# Patient Record
Sex: Female | Born: 1950 | Race: Black or African American | Hispanic: No | Marital: Single | Smoking: Never smoker
Health system: Southern US, Community
[De-identification: ages and names within clinical notes are randomized; demographics above are authoritative.]

## PROBLEM LIST (undated history)

## (undated) DIAGNOSIS — D0511 Intraductal carcinoma in situ of right breast: Secondary | ICD-10-CM

## (undated) DIAGNOSIS — Z9013 Acquired absence of bilateral breasts and nipples: Secondary | ICD-10-CM

## (undated) DIAGNOSIS — Z170 Estrogen receptor positive status [ER+]: Secondary | ICD-10-CM

## (undated) DIAGNOSIS — R404 Transient alteration of awareness: Secondary | ICD-10-CM

## (undated) DIAGNOSIS — T50905A Adverse effect of unspecified drugs, medicaments and biological substances, initial encounter: Secondary | ICD-10-CM

## (undated) DIAGNOSIS — Z9011 Acquired absence of right breast and nipple: Secondary | ICD-10-CM

## (undated) DIAGNOSIS — C50112 Malignant neoplasm of central portion of left female breast: Principal | ICD-10-CM

## (undated) DIAGNOSIS — I10 Essential (primary) hypertension: Secondary | ICD-10-CM

## (undated) DIAGNOSIS — E7800 Pure hypercholesterolemia, unspecified: Secondary | ICD-10-CM

## (undated) DIAGNOSIS — S0990XA Unspecified injury of head, initial encounter: Secondary | ICD-10-CM

## (undated) HISTORY — PX: NASAL SINUS SURGERY: SHX719

## (undated) HISTORY — PX: LUNG SURGERY: SHX703

## (undated) HISTORY — PX: BRAIN SURGERY: SHX531

---

## 2013-10-13 ENCOUNTER — Inpatient Hospital Stay
Admit: 2013-10-13 | Discharge: 2013-10-14 | Disposition: A | Discharge status: Home / Self Care | Attending: Surgery | Admitting: Surgery

## 2013-10-13 NOTE — Op Note (Signed)
Dawn Padilla, Dawn Padilla      SURGERY DATE:         10/13/2013  MEDICAL RECORD NUMBER:     [id number]            ADMISSION DATE:       10/13/2013  ACCOUNT #:           [id number]           ADMITTING:            Nicholes Mango, MD  DATE OF BIRTH:       [birth year]             SURGEON:              Nicholes Mango, MD  AGE:                 [place]:                                  Electronically [place], MD 10/16/2013 07:12 P        Procedure:  LEFT MODIFIED RADICAL MASTECTOMY.    Preoperative Diagnosis:  Left breast cancer, lobular type.    Postoperative Diagnosis:  Left breast cancer, lobular type.    Anesthesia:  General.    Assistant:  Lelon Huh, M.D.    Fluids:  1300 crystalloid.    Estimated Blood Loss:  Minimal.    Medications Given:  Ancef 1 g.    Drains Placed:  Two Jackson-Pratt drains.    Clinical History:  This is a 63 year old African-American female, who  was found to have some indeterminate microcalcifications in the left  breast.  She has multiple medical problems.  She was unable to have a  stereotactic biopsy due to her breast size and therefore had an open  biopsy for these calcifications, which were benign; however, she did  have invasive lobular carcinoma identified with positive margins.  She  had a breast MRI that showed residual enhancement.  No evidence of any  adenopathy and clinically she was node negative.  She was ER positive.  Various surgical options were given including reexcision lumpectomy  with sentinel biopsy, followed by radiation or mastectomy with or  without reconstruction.  As stated, she does have multiple medical  problems.  She elected to have a mastectomy without reconstruction due  to her medical issues.  Risks and benefits of the procedure were  explained including, but not limited to bleeding, infection, seroma  formation, lymphedema.  She understood and  agreed to proceed with  surgery.    Description of Procedure:  The patient was brought to the operating  room and placed in a supine position.  General anesthesia was induced.  I injected approximately 500 microcuries of technetium-90m tilmanocept  in 4 divided doses intradermally around the areola using sterile  technique and also injected 4 mL of methylene blue in intraparenchymal  fashion.  The left breast was massaged for about 5 minutes.  The left  breast and axilla were then prepped and draped in  the usual sterile  manner.  The plasma blade was used to create an elliptical incision  around the nipple-areolar complex.  Skin flaps raised superiorly to  the clavicle, medially to the sternum, inferiorly to the edge of the  breast tissue and laterally to latissimus dorsi.  The breast and  pectoralis fascia removed in a medial to lateral fashion.  The  clavipectoral fascia was opened.  She had a deep blue lymphatic going  to a hot blue lymph node.  This was somewhat firm.  There were 2  additional sentinel nodes that were removed.  Sentinel node #1 was  positive and therefore completion axillary dissection was carried out.  The axillary vein was identified.  Tributaries to the axillary vein  were clipped and divided.  The long thoracic and thoracodorsal nerves  were both identified and preserved.  The level 1 and 2 axillary tissue  was then removed and sent separately.  The wound was irrigated.  Hemostasis controlled.  Two Jackson-Pratt drains were placed through  separate stab wound.  Skin incision was closed in the usual manner.  She tolerated the procedure well.    Diskriter Job ID: [id number]        Nicholes Mango, MD    DOD:10/13/2013 10:16 A  VLV/dsk  DOT:10/13/2013 05:39 P  Job Number: [id number]  Document Number: [id number]  cc:   Nicholes Mango, MD        [address]        [place] [address]

## 2013-10-14 LAB — CBC WITH AUTO DIFFERENTIAL
Absolute Baso #: 0 10*3/uL (ref 0.0–0.2)
Absolute Eos #: 0.1 10*3/uL (ref 0.0–0.5)
Absolute Lymph #: 1.3 10*3/uL (ref 1.0–4.3)
Absolute Mono #: 0.4 10*3/uL (ref 0.0–0.8)
Absolute Neut #: 10.9 10*3/uL — ABNORMAL HIGH (ref 1.8–7.0)
Basophils: 0.1 %
Eosinophils: 0.4 %
Granulocytes %: 85.8 %
Hematocrit: 35.6 % (ref 35.0–47.0)
Hemoglobin: 11.9 g/dl (ref 11.7–16.0)
Lymphocyte %: 10.4 %
MCH: 32.5 pg (ref 26.0–34.0)
MCHC: 33.5 % (ref 32.0–36.0)
MCV: 97.3 fl (ref 79.0–98.0)
MPV: 9 fl (ref 7.4–10.4)
Monocytes: 3.3 %
Platelets: 230 10*3/uL (ref 140–440)
RBC: 3.66 10*6/uL — ABNORMAL LOW (ref 3.80–5.20)
RDW: 14.3 % (ref 11.5–14.5)
WBC: 12.7 10*3/uL — ABNORMAL HIGH (ref 3.6–10.7)

## 2013-10-14 LAB — BASIC METABOLIC PANEL
Anion Gap: 10
BUN: 10 mg/dL (ref 7–25)
CO2: 22 mmol/L (ref 21–32)
Calcium: 8.4 mg/dL (ref 8.2–10.1)
Chloride: 108 mmol/L (ref 98–109)
Creatinine: 0.86 mg/dL (ref 0.60–1.50)
EGFR IF NonAfrican American: >60 mL/min (ref >60)
Glucose: 122 mg/dL — ABNORMAL HIGH (ref 70–100)
Potassium: 4.2 mmol/L (ref 3.5–5.1)
Sodium: 140 mmol/L (ref 135–145)
eGFR African American: >60 mL/min (ref >60)

## 2013-11-25 NOTE — Progress Notes (Signed)
Left Breast:   Cancer(s) invasive lobular Stage 2A L breast cancer, 1/6 LN+ without extracapsular extension. T1N1. We discussed her pathology report - she is ER+ with just one LN +, Her oncotype testing was low ( 10). She saw Dr. Lyda Kalata who put her on Tamoxifen which she started last week ( no aromatase inhibitor due to osteoporosis).  Dr. Lyda Kalata did not recommend radiation due to her multiple medical problem,s. She was referred to PT for limited range of motion but did not show up for her appt.  Exam: L chest wall is well healed. No seroma or infection. Still with difficulty lifting arm- needs PT  IMP L breast cancer lobular Stage 2A, ER+, HER2 -  REC. Refer to radiation oncology  Refer to PT  Continue tamoxifen  FU 4 mos

## 2013-11-25 NOTE — Patient Instructions (Addendum)
Referral sent to Dr Lizabeth Leyden for new patient appointment.  If they have not contacted you with an appointment call their office at [phone]  Pt was previously referred for PT/OT at [facility] but missed her appointment,she is now on their wait list

## 2013-12-16 NOTE — Consults (Signed)
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    STAGE:  pT1b N1a, IIA.    HISTORY:  This is a 63 year old female, who was asymptomatic in the  breast region.  She had screening mammogram and bone density on  07/24/2013, through her primary physician.  The DEXA scan noted  osteoporosis.  The mammography identified a grouping of micro  calcifications in the medial left breast posteriorly.  The patient  underwent diagnostic views on 08/06/2013, which noted a group of  calcifications in the upper inner left breast, approximately 3 cm in  diameter.  They noted that due to the location, stereotactic biopsy  was not feasible and excisional biopsy was recommended.  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  LCIS was found.  The inferior margin was positive  for invasive disease.  The margin was negative for in situ disease.  There was no lymphovascular invasion.  No nodes were removed at that  time.  Based on this, it was considered to be pathologically staged  T1a NX.  The tumor was ER positive at 90%.  PR positive at 10% and  Her-2/neu negative at 0.  MRI of the breast was then performed on  08/31/2013, identified moderate background enhancement with multiple  enhancing foci throughout the dense tissue breast.  On the right side,  they noted no suspicious areas.  There was some non  specific  enhancement within the right nipple and clinical correlation was  recommended.  The left breast had post surgical changes.  There was a  fluid collection measuring 2.5 x 2 x 4 cm with expected enhancement in  the lateral inferior margin with 1 cm area of non mass enhancement, it  could be the area, positive biopsy.  No adenopathy was seen in the  axilla or internal mammary region.  The patient had a chest x-ray on  10/05/2013, it showed no significant change when compared to prior  study in May.  COPD findings were noted.  The patient underwent  genetic testing at [facility], which was negative.  Oncotype  testing was performed showing a level of 10 ( low risk category).  On  10/13/2013, she underwent a left modified radical mastectomy after  discussion of local therapies.  Pathology identified residual invasive  lobular carcinoma in the left breast.  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  One out of 3  sentinel nodes were positive.  Three non sentinel nodes were negative.  Tumor measured 0.9 cm, largest invasive component is grade I.  Skin  and nipple were negative.  Margin was negative  for the invasive  component  posteriorly  at 0.7 cm.  There was no lymphovascular  invasion.  They noted that the measurement of the tumor was tampered  by previous biopsy.  The previous specimen had a tumor size of 0.4 cm  and the current was 0.9 cm and it is difficult to figure out this site  with specimen.  This was pathologically felt to be at least a pT1b  N1a, II A.  the patient notes that she has stiffness in the arms, she  is on a list for PT/OT in September.  She has seen medical Oncology  with the Oncotype testing and was started on Tamoxifen on November 17, 2013.  She is here today to discuss possible radiation therapy.    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  She had brain surgery with 3 clips placed.  A while ago  she had CHF, bilateral cataracts developing, history of irregular  heart rate, hernia, irritable bowel syndrome, and acid reflux, hiatal  hernia, toe fractures, anxiety, for which she has been on medicines  for a while.  Anemia, chronic back pain, left-sided slightly weak, she  utilizes a cane.    PAST SURGICAL HISTORY:  Left breast biopsy was negative in 1969, the  breast surgeries as noted above for this breast cancer diagnosis,  right lung/thoracotomy surgery 1976, TAH-BSO in 1977, approximately at  age 73.  She was then on estrogen replacement for 36 years.  Surgery  was performed for a fibroid.  Right carotid surgery in 2006,  craniotomy with subdural hematoma in 2003 x3 clips were placed.    GYNECOLOGICAL HISTORY:  G1, P1, pregnancy at age 11, at age 55, she  had hysterectomy and bilateral salpingo-oophorectomy with fibroids.  Menstrual cycle started at age 64 and stopped at age 57.  She was on  estrogen replacement for about 36 years.  Benign left breast biopsy in  1969.    SOCIAL HISTORY:  The patient denies any tobacco or alcohol use.  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    FAMILY HISTORY:  Mother had lung cancer.  Sister, breast cancer at age  15.  Father stomach cancer.  Maternal grand mother, colon cancer.    ALLERGIES:  CODEINE, MORPHINE, DEMEROL, VICODIN, PERCOCET, NSAIDS.    MEDICATIONS:  Flexeril, benztropine, hydralazine, Neurontin,  metoprolol, Norvasc, dicyclomine, calcium D3, Lipitor, amlodipine,  vitamin D, Megace, Sinemet, omeprazole, cyclobenzaprine, gabapentin,  carbidopa-levodopa, tramadol, lisinopril, Lopressor.    REVIEW OF SYSTEMS:  A 14-system review with discussion with the  patient as well as review of intake patient history form reveals.  Constitutional:  Appetite is down.  She has dropped several pounds  since May.   She denies any  fevers, chills, she has hot flashes, she  has occasional headache.  Vision, glasses.  Ear, nose, throat and  mouth, occasional problem with swallowing since she had right carotid  surgery.  Respiratory:  She has dyspnea on exertion, occasional non  productive cough, no shortness of breath at rest.  Cardiovascular:  She notes occasional heart palpitations, no chest pain.  GI:  She has  irritable bowel syndrome and acid reflux, which flares at times.  GU/GYN:  Negative.  Musculoskeletal:  She has arthritis all over, it  is worst in the left should and left hip.  She has occasional pedal  edema.  She has arthritis symptoms.  She is not sure it is  osteoarthritis or rheumatoid.  She uses occasional cortisone shot as  needed.  Skin:  Negative.  She feels surgical site is healing well.  Breast:  As above.  Neuro:  Negative.  Psych:  Anxiety.  Heme/Lymphatic:  Anemia.  There is no history of connective tissue  disease, she has never had radiation or chemotherapy.  KPS=70.    LAB/X-RAY FINDINGS:  Radiology testing as per HPI.  Blood work on  10/14/2013, shows a white count of 12.7, hemoglobin 11.9, platelet  count 237,000 on the same date.  BMP was within normal limits except  for glucose of 122.    PHYSICAL EXAMINATION:  Alert, oriented female, who is in no acute  distress. Affect is stable. Speech is fluent. Oriented x3.  She  ambulates with a cane, noting that she has slight left-sided weakness  following the mini strokes with subdural hematoma.  Vital Signs:  BP  148/63 with pulse of 61. Resp rate 20, temp 97.6 degrees. Height 64  inches with weight of 109 lb. Sat is 95%.  Lungs fields are clear  bilaterally.  Heart is regular.  S1, S2 noted.  There is no spine or  posterior chest wall tenderness elicited.  There is no  supraclavicular, infraclavicular or axillary adenopathy bilaterally.  Right breast is without mass or nipple discharge.  Left chest wall had  a healing scar, horizontally extending  towards the axilla.  There is  no suspicious nodularity appreciated.  Abdomen is soft, nontender  without hepatosplenomegaly or suspicious mass.  There is mild  peripheral edema noted in the ankles.  Motor strength is slightly  decreased in the left lower extremity, 4/5 in comparison to the right.  Upper extremities are 5/5.  Sensation appears intact to light touch in  the forearm and shin  region.  Oral exam does not reveal any posterior  erythema or exudate.  Cranial nerves II through XII appear grossly  intact except for vision fields were not tested.  She was able to hear  the light finger rub. The patient has a scar on the right neck from  the carotid surgery. No palpable thyroid lesion.    IMPRESSION/PLAN:  This is a 63 year old female, who presents with  stage II left breast carcinoma status post modified radical mastectomy  with low Oncotype DX recurrence score resulting in placement on  tamoxifen.  I spoke to the patient and her sister about the use of  postoperative radiation therapy following mastectomy with positive  lymph nodes.  In terms of the radiation, we went over treatment  planning setup, projected treatment course, possible side effects,  both acute and long term, these side effects while on tiredness, skin  reaction, skin breakdown, swelling, soreness, low blood counts,  long-term risk includes skin changes, stiffness of the musculatures,  stiff shoulder, arm swelling, rib weakening, lung injury, heart  injury, nerve injury, chronic arm swelling, secondary malignancy.  At  this point, the patient and her sister are declining XRT. She does not  feel she will tolerate therapy in  view of her overall health.   She  has been setup to see PT/OT and I have highly recommended that she  pursue this to maximize the flexibility in that shoulder joint and to  help with the stiffness.  She notes she will be placed on Tamoxifen.  She was given a script for Ensure and information on the [employer]  assistance.Marland Kitchen  She will continued follow up with other  physicians as prior.    This consultation has been requested by Dr. Nonda Lou to evaluate the  use of radiation therapy to treat breast cancer.  Copies of this has  been made available to requesting physician and the medical decision  making process for the care of this patient.    cc:  Dr. Lyda Kalata      Diskriter Job ID: [id number]      ______________________________  Yehuda Mao, MD    DOD:12/15/2013 09:32 P  DED/dsk  DOT:09/16/201502:11 A  cc: Agustina Caroli, MD      [address]      [id number]      [place] [address]        [place]      [address]      [address] [place] [address]        Nicholes Mango, Helper      [place] [address]

## 2014-03-17 ENCOUNTER — Encounter: Attending: Surgery | Primary: Family Medicine

## 2014-03-19 ENCOUNTER — Encounter: Attending: Surgery | Primary: Family Medicine

## 2014-03-22 MED ORDER — TAMOXIFEN CITRATE 20 MG PO TABS
20 | ORAL_TABLET | Freq: Every day | ORAL | Status: DC
Start: 2014-03-22 — End: 2014-07-12

## 2014-03-22 NOTE — Telephone Encounter (Signed)
Forward prescription to Dr. Lyda Kalata for approval

## 2014-04-21 ENCOUNTER — Encounter: Attending: Surgery | Primary: Family Medicine

## 2014-06-02 ENCOUNTER — Encounter: Attending: Surgery | Primary: Family Medicine

## 2014-06-30 ENCOUNTER — Ambulatory Visit
Admit: 2014-06-30 | Discharge: 2014-06-30 | Payer: PRIVATE HEALTH INSURANCE | Attending: Surgery | Primary: Family Medicine

## 2014-06-30 DIAGNOSIS — C50112 Malignant neoplasm of central portion of left female breast: Secondary | ICD-10-CM

## 2014-06-30 NOTE — Patient Instructions (Signed)
Right diagnostic mammogram scheduled 07/28/14 9:00am [place] [id number]

## 2014-06-30 NOTE — Progress Notes (Signed)
Chief Complaint   Patient presents with   . Annual Exam     breast exam per recall      History of Present Illness:  Dawn Padilla is a 64 y.o. female s/p L mastectomy with ax node dissection for stage 2 L breast cancer ( T1N1M0), 7/15 she was not a candidate for reconstruction due to multiple medical problems and also was not candidate for radiation either due to her medical problems. Her oncotype score was low ( 10)- she saw medical oncology and is taking tamoxifen-- no AI due to severe osteoporosis. [facility]). 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 No bone pain or wt loss    Imaging:  Pathology:. BREAST, LEFT, WIRE LOCALIZATION EXCISIONAL BIOPSY - INVASIVE  LOBULAR CARCINOMA.  TUMOR IS PRESENT AT THE INFERIOR MARGIN OF RESECTION, SEE COMMENT.  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 SEE SYNOPTIC REPORT BELOW FOR DETAILS.    B. BREAST, LEFT, NEW SUPERIOR MARGIN, EXCISION - NEW MARGIN NEGATIVE  FOR IN SITU OR INVASIVE MALIGNANCY. FOCI OF ATYPICAL DUCTAL  HYPERPLASIA, STROMAL FIBROSIS AND ADENOSIS.    Synoptic report:  PROCEDURE: Wire localized excisional biopsy  LYMPH NODE SAMPLING: None present  SPECIMEN LATERALITY: Left  TUMOR SITE: Not specified  HISTOLOGIC TYPE OF INVASIVE CARCINOMA: Lobular  TUMOR SIZE (of largest invasive carcinoma): 0.4 cm in greatest  dimension  HISTOLOGIC GRADE (SBR/[place])  Glandular/Tubular Differentiation: 3 (< 10% tubules)  Nuclear Grade: 1  Mitotic Rate: 1  OVERALL GRADE: Grade 1 (3-5 points)  TUMOR FOCALITY: Single focus of invasive carcinoma  IN SITU COMPONENT: DCIS, EIC negative  Size/Extent of DCIS: 3 blocks with DCIS out of 24 blocks  examined  Architectural Patterns of DCIS: Micropapillary and cribriform  Nuclear Pleomorphism: Low (Grade I)  Necrosis: Absent  LOBULAR CARCINOMA IN SITU: Present  EXTENT OF INVASIVE TUMOR: Breast parenchyma  only  INVASIVE CARCINOMA MARGIN: Positive, inferior  IN SITU CARCINOMA MARGIN: Negative; nearest margin superior, 1 cm  away  LYMPHATIC/VASCULAR INVASION: Not identified  LYMPH NODES: Not applicable  PATHOLOGIC STAGING: pT1a, pNX - STAGE IA  ANCILLARY STUDIES: See below.    Comment: The invasive lobular carcinoma focus is away from the areas  of benign calcification, and is present at the inferior edge of the  specimen. The tumor appears to be transected, with cautery change at  the tumor edge. Immunostain for e-cadherin is negative, supporting the  diagnosis of lobular carcinoma.    ER, PR and HER2 IMMUNOHISTOCHEMISTRY:    Specimen Number: [id number]  Block Number: A8    ESTROGEN RECEPTORS:  Percentage of tumor nuclei positive: 90  Intensity of staining: Strong  INTERPRETATION: Positive    PROGESTERONE RECEPTORS:  Percentage of tumor nuclei positive: 10  Intensity of staining: Strong  INTERPRETATION: Positive    HER2 NEU IMMUNOHISTOCHEMISTRY:  Score: 0  INTERPRETATION: Negative  A. SENTINEL LYMPH NODE, LEFT AXILLA, DISSECTION - ONE LYMPH NODE OUT OF  THREE POSITIVE FOR METASTATIC LOBULAR CARCINOMA (1/3).    B. LEFT BREAST, MASTECTOMY - RESIDUAL INVASIVE LOBULAR CARCINOMA. SEE  BELOW.    BACKGROUND BREAST TISSUE WITH PAPILLOMATOSIS AND LOBULAR CARCINOMA  IN SITU.    C. LEFT AXILLARY CONTENTS, DISSECTION - THREE LYMPH NODES NEGATIVE FOR  METASTATIC CARCINOMA (0/3).    Breast excision/mastectomy template:  PROCEDURE: Simple mastectomy  LYMPH NODE SAMPLING: Axillary dissection, sentinel lymph node  dissection  SPECIMEN LATERALITY: Left  TUMOR SITE: Upper quadrant  HISTOLOGIC TYPE OF INVASIVE CARCINOMA: Lobular  TUMOR SIZE (of largest invasive carcinoma): 0.9 cm in greatest  dimension as measured from slide  HISTOLOGIC GRADE (SBR/[place])  Glandular/Tubular Differentiation: 3 (< 10% tubules)  Nuclear Grade: 1  Mitotic Rate: 1  OVERALL GRADE: Grade 1 (3-5 points)  TUMOR FOCALITY: Single focus of invasive carcinoma  IN  SITU COMPONENT: None present  Size/Extent of DCIS: N/A  Architectural Patterns of DCIS: N/A  Nuclear Pleomorphism: Not applicable  Necrosis: Not applicable  LOBULAR CARCINOMA IN SITU: Present  EXTENT OF INVASIVE TUMOR: Breast only.  NIPPLE: Invasion of smooth muscle of the nipple identified.  SKIN: No evidence of invasion.  SKELETAL MUSCLE: Submitted. No evidence of invasion identified.  MICROCALCIFICATIONS: Present within benign ducts and malignancy.  INVASIVE CARCINOMA MARGIN: Negative; nearest margin posterior 0.7 cm  away  IN SITU CARCINOMA MARGIN: Negative; nearest margin posterior 0.6 cm  away  LYMPHATIC/VASCULAR INVASION: Not identified  LYMPH NODES: Positive  6 Total nodes examined  3 Sentinel nodes, 3 non-sentinel nodes  One with macrometastases (0.2 mm - 0.2 cm > 200 cells)  Largest metastatic deposit: 1 cm  Largest involved lymph node - 1.0 x 0.7 cm  Extracapsular extension - Not present.  PATHOLOGIC STAGING: at least pT1b, pN1a; STAGE: IIA  ANCILLARY STUDIES: Previously reported (SS [phone]) ER 90% positive;  PR 10% positive; Her2  Score 0 negative.    COMMENT: Accurate pathologic measurement of tumor size is hampered by  previous biopsy. The previous biopsy reported a tumor size of 0.4 cm,  while the current biopsy has a tumor size of 0.9 cm. Please correlate  with radiographic findings for the most accurate T sizing and staging.    Review of Systems:  Review of Systems   Constitutional: Negative for fever, diaphoresis, activity change, fatigue and unexpected weight change.   HENT: Negative for sore throat, trouble swallowing and voice change.    Eyes: Negative for photophobia and visual disturbance.   Respiratory: Negative for cough, choking, shortness of breath, wheezing and stridor.    Cardiovascular: Negative for chest pain and palpitations.   Gastrointestinal: Negative for nausea, vomiting, abdominal pain and constipation.   Endocrine: Negative for cold intolerance, heat intolerance, polydipsia,  polyphagia and polyuria.   Musculoskeletal: Positive for joint swelling and arthralgias. Negative for myalgias, neck pain and neck stiffness.   Skin: Negative for color change, pallor, rash and wound.   Allergic/Immunologic: Negative for immunocompromised state.   Neurological: Negative for tremors, weakness and numbness.   Hematological: Negative for adenopathy. Does not bruise/bleed easily.   Psychiatric/Behavioral: Negative for confusion and decreased concentration. The patient is not nervous/anxious.        Past Medical History   Diagnosis Date   . Asthma    . Hypertension    . Cancer [facility]) 2015     left breast   . Bleeding ulcer      Past Surgical History   Procedure Laterality Date   . Breast biopsy Left 1974     benign   . Breast biopsy Left 08/17/13     malignant   . Breast surgery Left 10/13/13     mastectomy   . Hysterectomy  1977   . Lung surgery Right 1976   . Brain surgery  2003   . Carotid endarterectomy  2006     Allergies   Allergen Reactions   . Codeine    . Demerol Hcl [Meperidine]    .  Morphine    . Nsaids Other (See Comments)     Bleeding ulcer   . Percocet [Oxycodone-Acetaminophen]    . Vicodin [Hydrocodone-Acetaminophen]      BP 154/63 mmHg  Pulse 56  Ht 5' 4"  (1.626 m)  Wt 118 lb (53.524 kg)  BMI 20.24 kg/m2  Current Outpatient Prescriptions on File Prior to Visit   Medication Sig Dispense Refill   . tamoxifen (NOLVADEX) 20 MG tablet Take 1 tablet by mouth daily 30 tablet 3   . cyclobenzaprine (FLEXERIL) 10 MG tablet Take 10 mg by mouth 3 times daily as needed for Muscle spasms     . benztropine (COGENTIN) 0.5 MG tablet Take 0.5 mg by mouth 2 times daily     . hydrALAZINE (APRESOLINE) 25 MG tablet Take 25 mg by mouth 3 times daily     . gabapentin (NEURONTIN) 100 MG capsule Take 100 mg by mouth 3 times daily     . metoprolol (LOPRESSOR) 100 MG tablet Take 100 mg by mouth 2 times daily     . amLODIPine (NORVASC) 5 MG tablet Take 5 mg by mouth daily     . dicyclomine (BENTYL) 20 MG tablet Take  20 mg by mouth every 6 hours     . atorvastatin (LIPITOR) 20 MG tablet Take 20 mg by mouth daily     . vitamin D (ERGOCALCIFEROL) 50000 UNITS CAPS capsule Take 50,000 Units by mouth once a week     . carbidopa-levodopa (SINEMET) 25-100 MG per tablet Take 1 tablet by mouth 3 times daily     . traMADol (ULTRAM) 50 MG tablet Take 50 mg by mouth every 6 hours as needed for Pain     . lisinopril (PRINIVIL;ZESTRIL) 40 MG tablet Take 40 mg by mouth daily       No current facility-administered medications on file prior to visit.         Physical Exam:  Physical Exam   Constitutional: She is oriented to person, place, and time. She appears well-developed and well-nourished. No distress.   HENT:   Head: Normocephalic and atraumatic.   Eyes: Conjunctivae are normal. No scleral icterus.   Neck: Normal range of motion. Neck supple. No JVD present. No tracheal deviation present. No thyromegaly present.   Cardiovascular: Normal heart sounds.    Pulmonary/Chest: Effort normal and breath sounds normal. No stridor. No respiratory distress. She exhibits no mass, no tenderness, no bony tenderness, no deformity and no swelling. Right breast exhibits no inverted nipple, no mass, no nipple discharge, no skin change and no tenderness. Left breast exhibits no mass, no skin change and no tenderness. Breasts are symmetrical.       Musculoskeletal: Normal range of motion. She exhibits no edema or tenderness.   Lymphadenopathy:     She has no cervical adenopathy.        Right cervical: No superficial cervical, no deep cervical and no posterior cervical adenopathy present.       Left cervical: No superficial cervical, no deep cervical and no posterior cervical adenopathy present.     She has no axillary adenopathy.        Right axillary: No pectoral and no lateral adenopathy present.        Left axillary: No pectoral and no lateral adenopathy present.       Right: No supraclavicular adenopathy present.        Left: No supraclavicular adenopathy  present.   Neurological: She is alert and oriented to person,  place, and time. No cranial nerve deficit.   Skin: Skin is warm and dry. No rash noted. She is not diaphoretic. No erythema. No pallor.   Psychiatric: She has a normal mood and affect. Her behavior is normal.        Assessment:   1. Malignant neoplasm of central portion of left female breast ([place])    2. Fibrocystic breast, right          Plan:    Orders Placed This Encounter   Procedures   . MAM Digital Diagnostic Right     Patient is to continue yearly mammogram, physical exam and call if any questions or concerns. Maintain/ strive for healthy weight and exercise regularly. Continue Tamoxifen    Lennon Alstrom MD  06/30/14

## 2014-07-13 MED ORDER — TAMOXIFEN CITRATE 20 MG PO TABS
20 | ORAL_TABLET | Freq: Every day | ORAL | Status: DC
Start: 2014-07-13 — End: 2014-12-29

## 2014-07-28 ENCOUNTER — Encounter: Attending: Hematology & Oncology | Primary: Family Medicine

## 2014-07-29 ENCOUNTER — Ambulatory Visit
Admit: 2014-07-29 | Discharge: 2014-07-29 | Payer: PRIVATE HEALTH INSURANCE | Attending: Hematology & Oncology | Primary: Family Medicine

## 2014-07-29 DIAGNOSIS — C50912 Malignant neoplasm of unspecified site of left female breast: Secondary | ICD-10-CM

## 2014-07-29 NOTE — Progress Notes (Signed)
HPI  Dawn Padilla is a 64 y.o.  female who comes in today for follow-up. she feels well.  Apettite  and energy levels are good.  No hot flashes. No vaginal dryness or hair thinnning.    she has the following oncology history  1.  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  2.  Currently on tamoxifen since 11/16/2013.      Review of Systems   Constitutional: Negative for fever, fatigue and unexpected weight change.   HENT: Negative for mouth sores and trouble swallowing.    Eyes: Negative for photophobia and visual disturbance.   Respiratory: Negative for cough and shortness of breath.    Cardiovascular: Negative for chest pain.   Gastrointestinal: Negative for nausea, vomiting, abdominal pain and diarrhea.   Genitourinary: Negative for dysuria and hematuria.   Musculoskeletal: Negative for myalgias and arthralgias.   Skin: Positive for color change (hyperpigmentation on forehead). Negative for rash.   Neurological: Negative for seizures, light-headedness and headaches.   Hematological: Negative for adenopathy. Does not bruise/bleed easily.   Psychiatric/Behavioral: Negative for sleep disturbance.         Family History   Problem Relation Age of Onset   . Cancer Mother      lung   . Cancer Father      stomach   . Cancer Sister      breast   . Cancer Maternal Grandmother      colon     History   Substance Use Topics   . Smoking status: Never Smoker    . Smokeless tobacco: Not on file   . Alcohol Use: No       Filed Vitals:    07/29/14 0911   BP: 151/69   Pulse: 60   Temp: 97.3 F (36.3 C)   SpO2: 99%     Physical Exam   Constitutional: She is oriented to person, place, and time. No distress.   HENT:   Head: Normocephalic.   Mouth/Throat: No oropharyngeal exudate.   Eyes: EOM are normal. Pupils are equal, round, and reactive to light. No scleral icterus.   Neck: Neck supple.   Cardiovascular: Normal rate, regular rhythm and normal heart sounds.     Pulmonary/Chest: Breath sounds normal.   Abdominal: Soft. She exhibits no distension and no mass. There is no tenderness. There is no rebound and no guarding.   Musculoskeletal: Normal range of motion.   Lymphadenopathy:     She has no cervical adenopathy.   Neurological: She is alert and oriented to person, place, and time. No cranial nerve deficit.   Skin: No rash noted.   Psychiatric: She has a normal mood and affect.   Breast exam deferred: Had recent one with Dr. Nonda Lou and has another one later today in mammography.    Lab Results   Component Value Date    WBC 12.7* 10/14/2013    HGB 11.9 10/14/2013    HCT 35.6 10/14/2013    MCV 97.3 10/14/2013    PLT 230 10/14/2013     Assessment/Plan:  Dawn Padilla was seen today for follow-up.    Diagnoses and associated orders for this visit:    Breast cancer, stage 2, left ([place])    1. she appears to be in complete remission. Will do only symptom guided imaging.  2. Mammogram yearly.  3. Continue tamoxifen 20mg  daily. Planned duration is 10 years. Compliance assessed. Patient is taking medication at appropriate dose  and schedule.  4. [name] on diet and exercise.  Patient verbalizes understanding and agrees with the plan

## 2014-09-07 LAB — COMPREHENSIVE METABOLIC PANEL
ALT: 12 U/L (ref 12–78)
AST: 18 U/L (ref 15–37)
Albumin,Serum: 3.8 g/dL (ref 3.4–5.0)
Alkaline Phosphatase: 66 U/L (ref 45–117)
Anion Gap: 8 NA
BUN: 12 mg/dL (ref 7–25)
CO2: 25 mmol/L (ref 21–32)
Calcium: 8.5 mg/dL (ref 8.2–10.1)
Chloride: 110 mmol/L — ABNORMAL HIGH (ref 98–109)
Creatinine: 0.84 mg/dL (ref 0.55–1.40)
EGFR IF NonAfrican American: >60 mL/min (ref >60)
Glucose: 68 mg/dL — ABNORMAL LOW (ref 70–100)
Potassium: 3.8 mmol/L (ref 3.5–5.1)
Sodium: 143 mmol/L (ref 135–145)
Total Bilirubin: 0.5 mg/dL (ref 0.2–1.0)
Total Protein: 6.6 g/dL (ref 6.4–8.2)
eGFR African American: >60 mL/min (ref >60)

## 2014-09-07 LAB — CBC
Hematocrit: 39.9 % (ref 35.0–47.0)
Hemoglobin: 13.1 g/dL (ref 11.7–16.0)
MCH: 31.9 pg (ref 26.0–34.0)
MCHC: 33 % (ref 32.0–36.0)
MCV: 96.8 fL (ref 79.0–98.0)
MPV: 9.2 fL (ref 7.4–10.4)
Platelets: 219 10*3/uL (ref 140–440)
RBC: 4.12 10*6/uL (ref 3.80–5.20)
RDW: 15.2 % — ABNORMAL HIGH (ref 11.5–14.5)
WBC: 5.5 10*3/uL (ref 3.6–10.7)

## 2014-09-07 LAB — TSH: TSH: 2.51 uU/mL (ref 0.358–3.740)

## 2014-09-07 LAB — LIPID PANEL
Cholesterol: 125 mg/dL (ref <200)
HDL: 2 NA
HDL: 57 mg/dL (ref 40–59)
LDL Cholesterol: 56 mg/dL (ref <100)
Triglycerides: 61 mg/dL (ref <150)

## 2014-09-07 LAB — VITAMIN D 25 HYDROXY: Vit D, 25-Hydroxy: 52 ng/mL (ref 30–80)

## 2014-09-09 LAB — CULTURE, URINE
Urine Culture, Routine: >100000
Urine Culture, Routine: NORMAL — AB

## 2014-12-15 ENCOUNTER — Encounter: Attending: Surgery | Primary: Family Medicine

## 2014-12-22 ENCOUNTER — Ambulatory Visit
Admit: 2014-12-22 | Discharge: 2014-12-22 | Payer: PRIVATE HEALTH INSURANCE | Attending: Surgery | Primary: Family Medicine

## 2014-12-22 DIAGNOSIS — C50112 Malignant neoplasm of central portion of left female breast: Secondary | ICD-10-CM

## 2014-12-22 MED ORDER — SURGICAL BRA
0 refills | Status: DC
Start: 2014-12-22 — End: 2016-03-06

## 2014-12-22 MED ORDER — BREAST PROSTHESIS
0 refills | Status: DC
Start: 2014-12-22 — End: 2016-03-06

## 2014-12-22 NOTE — Progress Notes (Signed)
Chief Complaint   Patient presents with   . 6 Month Follow-Up     breast exam     History of Present Illness:  Dawn Padilla is a 64 y.o. female  s/p L mastectomy with ax node dissection for stage 2 L breast cancer ( T1N1M0), 7/15 she was not a candidate for reconstruction due to multiple medical problems and also was not candidate for radiation either due to her medical problems. Her oncotype score was low ( 10)- she saw medical oncology and is taking tamoxifen-- no AI due to severe osteoporosis. [facility]). She is doing better after PT for L chest wall and arm mobility. No wt loss or bone pain. She does have arthritis and has had cortisone injections L shoulder.    Imaging:  CPT4 Codes    57322 (MG MAMMO CAD DIAGNOSTIC ADD ON), 02542 (MG Breast Tomosynthesis    Right), 70623 (MG MAMMO 2D TOMO DIAGNOSTIC)          Reason For Exam    malignant neoplasm central portion left breast//right fibrocystic breast          Report    PATIENT HISTORY: Patient is postmenopausal and has history of cancer in the    left breast at age 8.    Family history of unknown cancer in mother and breast cancer in sister at    age 31.    Malignant mastectomy of the left breast, October 13, 2013. Malignant    excisional biopsy of the left breast, Aug 17, 2013. Benign excisional    biopsy of the left breast, 1969.    Took estrogen for 34 years beginning at age 73. Taking tamoxifen for 8    months beginning at age 77.    Patient's BMI is 20.3.       TIME SINCE LAST MAMMOGRAM: Last mammogram was performed 1 year ago.       REASON FOR EXAM: history of breast cancer, mastectomy.          PROCEDURE: MG BREAST TOMOSYNTHESIS RIGHT: July 29, 2014 - ACCESSION #:    [id number]    2D/3D Procedure    3D CC and MLO view(s) were taken of the right breast.    2D CC and MLO view(s) were taken of the right breast.    Prior study comparison: Aug 06, 2013, left breast MG mammogram digital    diagnostic left performed at [facility]. July 24, 2013, bilateral MG mammogram digital screening performed at [name]. July 23, 2012, bilateral MG mammogram digital    screening performed at [name]. June 27, 2011,    bilateral MG mammogram digital screening performed at [name].    The breast tissue is heterogeneously dense, which could obscure underlying    abnormalities. The patient is a 64 year old female with a    history of a left mastectomy for left breast cancer.       No discrete masses, suspicious tumor calcifications, skin thickening or    nipple retraction are seen. There has been no significant interval    change.    2D digital mammography and tomosynthesis imaging were performed and    reviewed with CAD.       ASSESSMENT: Category 1 Negative    No mammographic evidence of malignancy.       RECOMMENDATION: Routine screening mammogram of the right breast in 1 year.  Review of Systems:  Review of Systems   Constitutional: Negative for activity change, diaphoresis, fatigue, fever and unexpected weight change.   HENT: Negative for sore throat, trouble swallowing and voice change.    Eyes: Negative for photophobia and visual disturbance.   Respiratory: Negative for cough, choking, shortness of breath, wheezing and stridor.    Cardiovascular: Negative for chest pain and palpitations.   Gastrointestinal: Negative for abdominal pain, constipation, nausea and vomiting.   Endocrine: Negative for cold intolerance, heat intolerance, polydipsia, polyphagia and polyuria.   Musculoskeletal: Negative for arthralgias, myalgias, neck pain and neck stiffness.   Skin: Negative for color change, pallor, rash and wound.   Allergic/Immunologic: Negative for immunocompromised state.   Neurological: Negative for tremors, weakness and numbness.   Hematological: Negative for adenopathy. Does not bruise/bleed easily.   Psychiatric/Behavioral: Negative for confusion  and decreased concentration. The patient is not nervous/anxious.        Past Medical History   Diagnosis Date   . Asthma    . Bleeding ulcer    . Breast cancer ([place]) 2015     left breast   . Chronic back pain    . Hiatal hernia    . Hyperlipidemia    . Hypertension    . Neuropathy    . Osteoarthritis      Past Surgical History   Procedure Laterality Date   . Hysterectomy  1977   . Lung surgery Right 1976   . Brain surgery  2003   . Carotid endarterectomy  2006   . Appendectomy     . Cholecystectomy     . Tonsillectomy     . Colonoscopy     . Upper gastrointestinal endoscopy     . Breast biopsy Left 1974     benign   . Breast biopsy Left 08/17/13     malignant   . Breast surgery Left 10/13/13     mastectomy     Allergies   Allergen Reactions   . Codeine    . Demerol Hcl [Meperidine]    . Morphine    . Nsaids Other (See Comments)     Bleeding ulcer   . Percocet [Oxycodone-Acetaminophen]    . Vicodin [Hydrocodone-Acetaminophen]      Visit Vitals   . BP 119/68   . Pulse 76   . Ht 5\' 4"  (1.626 m)   . Wt 112 lb (50.8 kg)   . BMI 19.22 kg/m2     Current Outpatient Prescriptions on File Prior to Visit   Medication Sig Dispense Refill   . tamoxifen (NOLVADEX) 20 MG tablet Take 1 tablet by mouth daily 30 tablet 5   . Calcium Carb-Cholecalciferol (CALCIUM + D3) 600-200 MG-UNIT TABS      . megestrol (MEGACE) 40 MG tablet      . omeprazole (PRILOSEC) 40 MG capsule      . cyclobenzaprine (FLEXERIL) 10 MG tablet Take 10 mg by mouth 3 times daily as needed for Muscle spasms     . benztropine (COGENTIN) 0.5 MG tablet Take 0.5 mg by mouth 2 times daily     . hydrALAZINE (APRESOLINE) 25 MG tablet Take 25 mg by mouth 3 times daily     . gabapentin (NEURONTIN) 100 MG capsule Take 100 mg by mouth 3 times daily     . metoprolol (LOPRESSOR) 100 MG tablet Take 100 mg by mouth 2 times daily     . amLODIPine (NORVASC) 5 MG tablet Take 5 mg by mouth  daily     . dicyclomine (BENTYL) 20 MG tablet Take 20 mg by mouth every 6 hours     .  atorvastatin (LIPITOR) 20 MG tablet Take 20 mg by mouth daily     . vitamin D (ERGOCALCIFEROL) 50000 UNITS CAPS capsule Take 50,000 Units by mouth once a week     . carbidopa-levodopa (SINEMET) 25-100 MG per tablet Take 1 tablet by mouth 3 times daily     . traMADol (ULTRAM) 50 MG tablet Take 50 mg by mouth every 6 hours as needed for Pain     . lisinopril (PRINIVIL;ZESTRIL) 40 MG tablet Take 40 mg by mouth daily       No current facility-administered medications on file prior to visit.          Physical Exam:  Physical Exam   Constitutional: She is oriented to person, place, and time. She appears well-developed and well-nourished. No distress.   HENT:   Head: Normocephalic and atraumatic.   Eyes: Conjunctivae are normal. No scleral icterus.   Neck: Normal range of motion. Neck supple. No JVD present. No tracheal deviation present. No thyromegaly present.   Cardiovascular: Normal heart sounds.    Pulmonary/Chest: Effort normal and breath sounds normal. No stridor. No respiratory distress. She exhibits no mass, no tenderness, no bony tenderness, no deformity and no swelling. Right breast exhibits no inverted nipple, no mass, no nipple discharge, no skin change and no tenderness. Left breast exhibits no mass, no skin change and no tenderness. Breasts are symmetrical.       No lymphedema. Fair ROM   Musculoskeletal: Normal range of motion. She exhibits no edema or tenderness.   Lymphadenopathy:     She has no cervical adenopathy.        Right cervical: No superficial cervical, no deep cervical and no posterior cervical adenopathy present.       Left cervical: No superficial cervical, no deep cervical and no posterior cervical adenopathy present.     She has no axillary adenopathy.        Right axillary: No pectoral and no lateral adenopathy present.        Left axillary: No pectoral and no lateral adenopathy present.       Right: No supraclavicular adenopathy present.        Left: No supraclavicular adenopathy present.    Neurological: She is alert and oriented to person, place, and time. No cranial nerve deficit.   Skin: Skin is warm and dry. No rash noted. She is not diaphoretic. No erythema. No pallor.   Psychiatric: She has a normal mood and affect. Her behavior is normal.        Assessment:   1. Malignant neoplasm of central portion of left female breast [facility])    Malignant neoplasm of left breast [facility])    Staging form: Breast, AJCC 7th Edition      Clinical stage from 06/30/2014: Stage IIA (T1c, N1, M0) - Signed by Lennon Alstrom, MD on 06/30/2014    History of osteoporosis  Continue tamoxifen daily      Plan:    Breast imaging R mammogram April 2017  Continue tamoxifen        Lennon Alstrom MD  12/22/14

## 2014-12-29 MED ORDER — TAMOXIFEN CITRATE 20 MG PO TABS
20 MG | ORAL_TABLET | Freq: Every day | ORAL | 5 refills | Status: DC
Start: 2014-12-29 — End: 2015-06-20

## 2015-01-27 ENCOUNTER — Encounter: Attending: Hematology & Oncology | Primary: Family Medicine

## 2015-02-02 ENCOUNTER — Ambulatory Visit
Admit: 2015-02-02 | Discharge: 2015-02-02 | Payer: PRIVATE HEALTH INSURANCE | Attending: Hematology & Oncology | Primary: Family Medicine

## 2015-02-02 DIAGNOSIS — C50112 Malignant neoplasm of central portion of left female breast: Secondary | ICD-10-CM

## 2015-02-02 NOTE — Progress Notes (Signed)
HPI  Dawn Padilla is a 64 y.o.  female who comes in today for follow-up. she feels well.  Apettite  and energy levels are good.  No hot flashes. No vaginal dryness or hair thinnning.    she has the following oncology history  1.  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  2.  Currently on tamoxifen since 11/16/2013.      Review of Systems   Constitutional: Negative for fatigue, fever and unexpected weight change.   HENT: Negative for mouth sores and trouble swallowing.    Eyes: Negative for photophobia and visual disturbance.   Respiratory: Negative for cough and shortness of breath.    Cardiovascular: Negative for chest pain.   Gastrointestinal: Negative for abdominal pain, diarrhea, nausea and vomiting.   Genitourinary: Negative for dysuria and hematuria.   Musculoskeletal: Negative for arthralgias and myalgias.   Skin: Negative for rash.   Neurological: Negative for seizures, light-headedness and headaches.   Hematological: Negative for adenopathy. Does not bruise/bleed easily.   Psychiatric/Behavioral: Negative for sleep disturbance.         Family History   Problem Relation Age of Onset   . Cancer Mother      lung   . Cancer Father      stomach   . Cancer Sister      breast   . Cancer Maternal Grandmother      colon     Social History   Substance Use Topics   . Smoking status: Never Smoker   . Smokeless tobacco: Never Used   . Alcohol use No       Vitals:    02/02/15 1112   BP: 109/66   Pulse: 65   Temp: 98 F (36.7 C)   SpO2: 100%     Physical Exam   Constitutional: She is oriented to person, place, and time. No distress.   HENT:   Head: Normocephalic.   Mouth/Throat: No oropharyngeal exudate.   Eyes: EOM are normal. Pupils are equal, round, and reactive to light. No scleral icterus.   Neck: Neck supple.   Cardiovascular: Normal rate, regular rhythm and normal heart sounds.    Pulmonary/Chest: Breath sounds normal.   Abdominal: Soft. She exhibits no  distension and no mass. There is no tenderness. There is no rebound and no guarding.   Musculoskeletal: Normal range of motion.   Lymphadenopathy:     She has no cervical adenopathy.   Neurological: She is alert and oriented to person, place, and time. No cranial nerve deficit.   Skin: No rash noted.   Psychiatric: She has a normal mood and affect.   Breast exam: Left breast mastectomy.  No tumor nodules.  Right breast reveals no lumps.  No axilla lymphadenopathy.    Lab Results   Component Value Date    WBC 5.5 09/07/2014    HGB 13.1 09/07/2014    HCT 39.9 09/07/2014    MCV 96.8 09/07/2014    PLT 219 09/07/2014   Last mammogram April 2016 is negative.    Assessment/Plan:  [name] was seen today for follow-up.    Diagnoses and all orders for this visit:    Malignant neoplasm of central portion of left female breast ([place])    1. she appears to be in complete remission. Will do only symptom guided imaging.    2. Left breast cancer, T1 N1 M0, stage IIa, ER/PR positive, HER-2 negative  Drug:  Tamoxifen  20 mg p.o. daily.  Treatment intent is curative.  Timing is adjuvant.  Planned duration is 10 years.  Response Rate: NA since adjuvant  I have discussed some of the side efffects that include but not limited to arthralgias, myalgias, vaginal dryness, hair thinning, hot flashes, osteopenia.  Prognosis: Five-year disease-free survival 85%.  Absolute benefit of tamoxifen 4%.  NCCN guidelines were used.  She consents to treatment. Shared decision making was performed.  Compliance assessed. Patient is taking medication at appropriate dose and schedule.    3. [name] on diet and exercise.  Patient verbalizes understanding and agrees with the plan.    4. Next Mammogram due April 2017.  Follow-up in one year for office visit.

## 2015-04-30 ENCOUNTER — Inpatient Hospital Stay: Admit: 2015-04-30 | Discharge: 2015-04-30 | Disposition: A | Discharge status: Home / Self Care

## 2015-04-30 NOTE — ED Provider Notes (Signed)
PATIENTADRIENNA, SOELBERG       DOS:           04/30/2015  MR #:             [id number]             ACCOUNT #:     [id number]  DATE OF BIRTH:    [birth year]              AGE:           65      HISTORY OF PRESENT ILLNESS:    PERTINENT HISTORY OF PRESENT  ILLNESS. Patient seen and evaluated  with Dr.  Wanda Plump.    Patient presents to the emergency department following a fall.  She  was at home  when she was exiting her residence, when she reached back to close the  door.  She lost her balance and fell down 2 steps on her left side.  She did  not hit  her head or lose consciousness.  She is complaining of left shoulder,  knee, and  hip pain.  She was able to ambulate following the incident.  She  inflates  normally with a cane.  She denies any other constitutional  complaints.    10 point review of systems as above, all other systems reviewed and  otherwise  unremarkable    PERTINENT PAST/ FAMILY/SOCIAL HISTORY Medical: Breast cancer,  congestive  heart failure, hypertension, hyperlipidemia, gastroesophageal reflux  disease  social: Denies alcohol, tobacco, or other drug use        PHYSICAL EXAM Vital signs unremarkable  General: Lying comfortably in bed  HEENT: normocephalic, nontraumatic, PERRL, neck supple, no midline  c-spine  tenderness to palpation  CVS: Regular rate and rhythm  Resp: No distress, CTA bilaterally  Abd: Soft, nondistended, normal bowel sounds, nontender  Ext: LUE: tenderness to palpation over left anterior shoulder and  proximal  humerus, SILT radial/median/ulnar, palpable radial pusle  LLE: tenderness to palpation over greater trochanter, left anterior  knee, SILT  distally, palpable DP/PT pulses  Back: Nontender, no CVA tenderness  Neuro: Alert and oriented x3, normal strength and sensation  Psych: Normal affect    MEDICAL DECISION MAKING:    SIGNIFICANT FINDINGS/ED COURSE/MEDICAL DECISION MAKING/TREATMENT  PLAN Patient  presents following a mechanical fall.  Patient declined  pain  medication here in  the emergency department.  X-rays of the left shoulder, knee, and hip  negative  for fracture or dislocation.  Patient was notified of results and is  comfortable with plan for discharge home and follow-up with primary  care  physician.  She is instructed to use heat or ice for pain control, as  well as  continuing to use Tylenol at home.  She is comfortable with this plan  and  understands reasons to return for follow-up evaluation.    PROBLEM LIST:       ED Diagnosis:     Multiple contusions (T14.8): Entered Date: 30-Apr-2015 15:15,  Entered By:  Hermelinda Dellen T, Status: Active, ICD-10: T14.8     Accidental fall [id number]): Entered Date: 30-Apr-2015 15:15,  Entered By:  Hermelinda Dellen T, Status: Active, ICD-10EC:[id number].Merril Abbe        ADDITIONAL INFORMATION The Emergency Medicine attending physician  was present  in the Emergency Department, who reviewed case management, and  approved  evaluation/treatment.      COPIES SENT TO::  Cherlynn Polo A(PCP): [id number]  Electronic Signatures:  Wanda Plump (MD)  (Signed 04-Aug-2015 22:27)   Co-Signer: HISTORY OF PRESENT ILLNESS, PROBLEM LIST, Copies to be  sent to:  Cornelia Copa (MD)  (Signed 30-Apr-2015 15:15)   Authored: HISTORY OF PRESENT ILLNESS, PHYSICAL EXAM, MEDICAL DECISION  MAKING,  PROBLEM LIST, Additional Infomation, Copies to be sent to:      Last Updated: 29-Sep-2015 10:44 by REASSIGN, PB (-)            Please see T-Sheet, initial assessment, and physician orders for  further details.    Dictating Physician: Hermelinda Dellen, MD  Original Electronic Signature Date: 04/30/2015 01:29 P  JTS  Document #: [id number]    cc:  America Brown       [address]       [place]       [place] [address]

## 2015-06-20 MED ORDER — TAMOXIFEN CITRATE 20 MG PO TABS
20 MG | ORAL_TABLET | Freq: Every day | ORAL | 5 refills | Status: DC
Start: 2015-06-20 — End: 2016-03-06

## 2015-08-03 NOTE — Other (Unsigned)
Patient Acct Nbr: [id number]   Primary AUTH/CERT:   [place] Name: [employer] Plan name: [facility]  Primary Insurance Group Number: [facility]  Primary Insurance Plan Type: Health  Primary Insurance Policy Number: [id number]    Secondary AUTH/CERT:   [place] Name: [employer] Plan name: [place]  Secondary Insurance Group Number:   Secondary Insurance Plan Type: Health  Secondary Insurance Policy Number: [id number]

## 2015-08-19 ENCOUNTER — Ambulatory Visit
Admit: 2015-08-19 | Discharge: 2015-08-19 | Payer: PRIVATE HEALTH INSURANCE | Attending: Women's Health | Primary: Family Medicine

## 2015-08-19 DIAGNOSIS — Z08 Encounter for follow-up examination after completed treatment for malignant neoplasm: Secondary | ICD-10-CM

## 2015-08-19 DIAGNOSIS — Z853 Personal history of malignant neoplasm of breast: Secondary | ICD-10-CM

## 2015-08-19 NOTE — Progress Notes (Signed)
HPI:  65 year old female presents for survivorship visit.    She has a history of left breast ILC StageIIA, ER+ PR+ HER2- diagnosed in 08/17/13 by core biopsy .   Treatments include:  Surgery-10/13/13 left mastectomy  Chemotherapy- Oncotype=10  Endocrine therapy-tamoxifen, tolerating well  Radiation therapy-none  Last mammogram date 08/03/15. BIRADS 1     Pt denies change in right breast or left chest wall. She has healed well from left mastectomy but still is tender and does not wear the prosthesis for long periods of time. She also is embarrassed to take off her clothes in front of her female friend. She does not want reconstruction.    Patient has new complaints of vaginal dryness.Pt given Vaginal atrophy sheet and samples of Luvena and Uberlube. Discussed cause and treatment of vaginal atrophy.    Pt has c/o bone and muscle pain related to arthritis, osteoporosis. She has occasional chest pain and palpitations. She is under care of Dr. Tera Helper. She has symptoms of Raynaud's in her fingers and toes. She does have anxiety and panics, but is not on medication.    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   Pain and Neuropathy:none  Fertility:na  Sexual Function:vaginal dryness  Menopause Symptoms:occasional hot flash  Weight management:stable  Physical Activity:does exercises recommended by PT  Nutrition:healthy, not good appetite  Elimination normal  Smoking Cessation:no  ETOH: no  Information Needs:Treatment summary, survivorship care plan and survivorship folder given and reviewed with patient. Sample of [place] given.    ROS:  Review of Systems   Constitutional: Negative for activity change, appetite change, fatigue and unexpected weight change.   Respiratory:  Negative for cough, shortness of breath, wheezing and stridor.    Cardiovascular: Positive for chest pain and palpitations. Negative for leg swelling.   Gastrointestinal: Positive for constipation (occasional, has stool softener). Negative for abdominal distention, abdominal pain, diarrhea, nausea and vomiting.   Endocrine: Negative for polydipsia, polyphagia and polyuria.   Genitourinary: Negative for dysuria, frequency, urgency and vaginal pain.   Musculoskeletal: Positive for arthralgias and myalgias. Negative for joint swelling.   Skin: Negative for pallor, rash and wound.   Neurological: Negative for dizziness, weakness, numbness and headaches.   Hematological: Negative for adenopathy.   Psychiatric/Behavioral: Negative for decreased concentration, dysphoric mood and sleep disturbance. The patient is nervous/anxious.        PHYSICAL EXAM:  Physical Exam   Constitutional: She is oriented to person, place, and time. She appears well-developed and well-nourished.   HENT:   Head: Normocephalic and atraumatic.   Eyes: No scleral icterus.   Neck: Normal range of motion. Neck supple. No tracheal deviation present. No thyromegaly present.   Cardiovascular: Normal rate, regular rhythm and normal heart sounds.    No murmur heard.  Pulmonary/Chest: Effort normal and breath sounds normal. No stridor. No respiratory distress. She has no wheezes. She has no rales. She exhibits no mass and no tenderness. Right breast exhibits no inverted nipple, no mass, no nipple discharge, no skin change and no tenderness. Left breast exhibits no inverted nipple, no mass, no nipple discharge, no skin change and no tenderness. Breasts are symmetrical.       Well healed. No masses palpable.   Abdominal: Soft. Bowel sounds are normal. She exhibits no distension and  no mass. There is no hepatosplenomegaly. There is no tenderness.   Musculoskeletal: Normal range of motion. She exhibits no edema or tenderness.   Lymphadenopathy:        Head  (right side): No submental, no submandibular, no tonsillar, no preauricular, no posterior auricular and no occipital adenopathy present.        Head (left side): No submental, no submandibular, no tonsillar, no preauricular, no posterior auricular and no occipital adenopathy present.     She has no cervical adenopathy.     She has no axillary adenopathy.        Right: No supraclavicular adenopathy present.        Left: No supraclavicular adenopathy present.   Neurological: She is alert and oriented to person, place, and time.   Skin: Skin is warm and dry. No rash noted. No erythema. No pallor.   Psychiatric: She has a normal mood and affect. Her behavior is normal. Judgment and thought content normal.   Vitals reviewed.      ASSESSMENT:   Malignant neoplasm of left breast [facility])    Staging form: Breast, AJCC 7th Edition      Clinical stage from 06/30/2014: Stage IIA (T1c, N1, M0) - Signed by Lennon Alstrom, MD on 06/30/2014  Lovely woman who is coping well with having had cancer, mastectomy, widowhood and raising 2 grandchildren who are now young adults. No masses felt in right breast, left chest wal is smooth and flat. Scar is well healed.    PLAN:  Patient to return in:13month  Next Mammogram:07/2016

## 2015-09-09 LAB — COMPREHENSIVE METABOLIC PANEL
ALT: 21 U/L (ref 12–78)
AST: 24 U/L (ref 15–37)
Albumin,Serum: 3.7 g/dL (ref 3.4–5.0)
Alkaline Phosphatase: 56 U/L (ref 45–117)
Anion Gap: 9 NA
BUN: 13 mg/dL (ref 7–25)
CO2: 24 mmol/L (ref 21–32)
Calcium: 9 mg/dL (ref 8.2–10.1)
Chloride: 110 mmol/L — ABNORMAL HIGH (ref 98–109)
Creatinine: 0.9 mg/dL (ref 0.55–1.40)
EGFR IF NonAfrican American: >60 mL/min (ref >60)
Glucose: 70 mg/dL (ref 70–100)
Potassium: 4.4 mmol/L (ref 3.5–5.1)
Sodium: 143 mmol/L (ref 135–145)
Total Bilirubin: 0.7 mg/dL (ref 0.2–1.0)
Total Protein: 6.9 g/dL (ref 6.4–8.2)
eGFR African American: >60 mL/min (ref >60)

## 2015-09-09 LAB — CBC WITH AUTO DIFFERENTIAL
Absolute Baso #: 0 10*3/uL (ref 0.0–0.2)
Absolute Eos #: 0.4 10*3/uL (ref 0.0–0.5)
Absolute Lymph #: 2.3 10*3/uL (ref 1.0–4.3)
Absolute Mono #: 0.4 10*3/uL (ref 0.0–0.8)
Absolute Neut #: 3.6 10*3/uL (ref 1.8–7.0)
Basophils: 0.5 %
Eosinophils: 6.1 %
Granulocytes %: 53.4 %
Hematocrit: 37.5 % (ref 35.0–47.0)
Hemoglobin: 12.4 g/dL (ref 11.7–16.0)
Lymphocyte %: 34.2 %
MCH: 32.4 pg (ref 26.0–34.0)
MCHC: 33.1 % (ref 32.0–36.0)
MCV: 97.7 fL (ref 79.0–98.0)
MPV: 8.8 fL (ref 7.4–10.4)
Monocytes: 5.8 %
Platelets: 185 10*3/uL (ref 140–440)
RBC: 3.83 10*6/uL (ref 3.80–5.20)
RDW: 14.9 % — ABNORMAL HIGH (ref 11.5–14.5)
WBC: 6.7 10*3/uL (ref 3.6–10.7)

## 2015-09-09 LAB — LIPID PANEL
Chol/HDL Ratio: 2 NA
Cholesterol: 125 mg/dL (ref <200)
HDL: 81 mg/dL — ABNORMAL HIGH (ref 40–59)
LDL Cholesterol: 33 mg/dL (ref <100)
Triglycerides: 53 mg/dL (ref <150)

## 2015-09-09 LAB — TSH: TSH: 1.37 uU/mL (ref 0.358–3.740)

## 2015-09-09 NOTE — Other (Unsigned)
Patient Acct Nbr: [id number]   Primary AUTH/CERT:   [place] Name: Universal [employer] Plan name: [facility]  Primary Insurance Group Number: [facility]  Primary Insurance Plan Type: Health  Primary Insurance Policy Number: [id number]    Secondary AUTH/CERT:   [place] Name: [place] name: [facility]  Secondary Insurance Group Number: [employer]  Secondary Insurance Plan Type: Barrister[employer] Number: [id number]

## 2015-09-10 LAB — VITAMIN D 25 HYDROXY: Vit D, 25-Hydroxy: 99 ng/mL (ref 30–100)

## 2015-12-12 MED ORDER — TAMOXIFEN CITRATE 20 MG PO TABS
20 MG | ORAL_TABLET | Freq: Every day | ORAL | 5 refills | Status: DC
Start: 2015-12-12 — End: 2016-05-28

## 2016-01-31 ENCOUNTER — Encounter: Attending: Hematology & Oncology | Primary: Family Medicine

## 2016-02-02 ENCOUNTER — Encounter: Attending: Hematology & Oncology | Primary: Family Medicine

## 2016-02-10 ENCOUNTER — Encounter: Attending: Surgery | Primary: Family Medicine

## 2016-02-21 ENCOUNTER — Encounter: Attending: Hematology & Oncology | Primary: Family Medicine

## 2016-03-06 ENCOUNTER — Ambulatory Visit
Admit: 2016-03-06 | Discharge: 2016-03-06 | Payer: PRIVATE HEALTH INSURANCE | Attending: Hematology & Oncology | Primary: Family Medicine

## 2016-03-06 DIAGNOSIS — C50112 Malignant neoplasm of central portion of left female breast: Secondary | ICD-10-CM

## 2016-03-06 NOTE — Progress Notes (Signed)
Distress/depression flowsheets updated. Referral to Dr Hulan Saas made per Dr Lyda Kalata. Patient aware of services offered at [facility]. Dr will continue to follow.

## 2016-03-06 NOTE — Progress Notes (Signed)
HPI  Dawn Padilla is a 65 y.o.  female who comes in today for follow-up. she feels well.  Apettite  and energy levels are good.  No hot flashes. No vaginal dryness or hair thinnning.    she has the following oncology history  1.  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  2.  Currently on tamoxifen since 11/16/2013.      Review of Systems   Constitutional: Negative for fatigue, fever and unexpected weight change.   HENT: Negative for mouth sores and trouble swallowing.    Eyes: Negative for photophobia and visual disturbance.   Respiratory: Negative for cough and shortness of breath.    Cardiovascular: Negative for chest pain.   Gastrointestinal: Negative for abdominal pain, diarrhea, nausea and vomiting.   Genitourinary: Negative for dysuria and hematuria.   Musculoskeletal: Negative for arthralgias and myalgias.   Skin: Negative for rash.   Neurological: Negative for seizures, light-headedness and headaches.   Hematological: Negative for adenopathy. Does not bruise/bleed easily.   Psychiatric/Behavioral: Negative for sleep disturbance.         Family History   Problem Relation Age of Onset   . Cancer Mother      lung   . Cancer Father      stomach   . Cancer Sister      breast   . Cancer Maternal Grandmother      colon     Social History   Substance Use Topics   . Smoking status: Never Smoker   . Smokeless tobacco: Never Used   . Alcohol use No       Vitals:    03/06/16 1422   BP: 128/68   Pulse: 64   Temp: 97.6 F (36.4 C)   SpO2: 100%     Physical Exam   Constitutional: She is oriented to person, place, and time. No distress.   HENT:   Head: Normocephalic.   Mouth/Throat: No oropharyngeal exudate.   Eyes: EOM are normal. Pupils are equal, round, and reactive to light. No scleral icterus.   Neck: Neck supple.   Cardiovascular: Normal rate, regular rhythm and normal heart sounds.    Pulmonary/Chest: Breath sounds normal.   Abdominal: Soft. She exhibits no  distension and no mass. There is no tenderness. There is no rebound and no guarding.   Musculoskeletal: Normal range of motion.   Lymphadenopathy:     She has no cervical adenopathy.   Neurological: She is alert and oriented to person, place, and time. No cranial nerve deficit.   Skin: No rash noted.   Psychiatric: She has a normal mood and affect.   Breast exam: Left breast mastectomy.  No tumor nodules.  Right breast reveals no lumps.  No axilla lymphadenopathy.    Lab Results   Component Value Date    WBC 6.7 09/09/2015    HGB 12.4 09/09/2015    HCT 37.5 09/09/2015    MCV 97.7 09/09/2015    PLT 185 09/09/2015   Last mammogram May 2017 is negative.    Assessment/Plan:  [name] was seen today for follow-up.    Diagnoses and all orders for this visit:    Malignant neoplasm of central portion of left breast in female, estrogen receptor positive ([place])    1. she appears to be in complete remission. Will do only symptom guided imaging.    2. 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 daily.  Treatment intent is curative.  Timing is adjuvant.  Planned duration is 10 years.  Response Rate: NA since adjuvant  I have discussed some of the side efffects that include but not limited to arthralgias, myalgias, vaginal dryness, hair thinning, hot flashes, osteopenia.  Prognosis: Five-year disease-free survival 85%.  Absolute benefit of tamoxifen 4%.  NCCN guidelines were used.  She consents to treatment. Shared decision making was performed.  Compliance assessed. Patient is taking medication at appropriate dose and schedule.    3. [name] on diet and exercise.  Patient verbalizes understanding and agrees with the plan.    4. Next Mammogram due April 2018.  Follow-up in one year for office visit.    5.  She recently lost her sister to breast cancer.  She is struggling mentally with some family issues as well.  I have referred her to Dr. Hulan Saas.

## 2016-04-27 ENCOUNTER — Encounter: Attending: Surgery | Primary: Family Medicine

## 2016-05-28 MED ORDER — TAMOXIFEN CITRATE 20 MG PO TABS
20 MG | ORAL_TABLET | Freq: Every day | ORAL | 5 refills | Status: DC
Start: 2016-05-28 — End: 2016-11-14

## 2016-07-11 ENCOUNTER — Ambulatory Visit
Admit: 2016-07-11 | Discharge: 2016-07-11 | Payer: PRIVATE HEALTH INSURANCE | Attending: Surgery | Primary: Family Medicine

## 2016-07-11 DIAGNOSIS — C50112 Malignant neoplasm of central portion of left female breast: Secondary | ICD-10-CM

## 2016-07-11 MED ORDER — SURGICAL BRA
0 refills | Status: DC
Start: 2016-07-11 — End: 2017-01-08

## 2016-07-11 MED ORDER — BREAST PROSTHESIS
0 refills | Status: DC
Start: 2016-07-11 — End: 2017-01-08

## 2016-07-11 NOTE — Progress Notes (Signed)
Chief Complaint   Patient presents with   . 6 Month Follow-Up     6 month breast exam      History of Present Illness:  Dawn Padilla is a 66 y.o. female who presents for survivorship visit:    she has the following oncology history  1.  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  2.  Currently on tamoxifen since 11/16/2013.  Treatments include:  Surgery-10/13/13 left mastectomy  Chemotherapy- Oncotype=10  Endocrine therapy-tamoxifen, tolerating well  Radiation therapy-none  Last mammogram date 08/03/15. BIRADS 1   No new breast concerns. She recently lost her sister in August 2017. No wt loss or cough      Imaging:  Reason For Exam    screening mammo          Report    PATIENT HISTORY: Patient is postmenopausal and has history of cancer in the    left breast at age 38.    Family history of unknown cancer in mother, breast cancer at age 46 in    sister.    Malignant mastectomy of the left breast, October 13, 2013. Malignant    excisional biopsy of the left breast, Aug 17, 2013. Benign excisional    biopsy of the left breast, 1969.    Took estrogen for 34 years beginning at age 28. Taking tamoxifen beginning    at age 30.    Patient has never smoked. Patient's BMI is 19.1.       TIME SINCE LAST MAMMOGRAM: Last mammogram was performed 1 year ago.       REASON FOR EXAM: screening, asymptomatic.          PROCEDURE: MG BREAST TOMOSYNTHESIS SCR RIGHT: Aug 03, 2015 - ACCESSION #:    [id number]    2D/3D Procedure    3D CC and MLO view(s) were taken of the right breast.    2D CC and MLO view(s) were taken of the right breast.       Technologist: Jetty Duhamel. [place], Mammography Technologist    Prior study comparison: July 29, 2014, MG breast tomosynthesis right    performed at [facility]. August 31, 2013, MR breast    w/+ w/o contrast bilateral performed at [facility]. Aug 06, 2013, MG mammogram digital diagnostic left  performed at [facility]. July 24, 2013, bilateral MG mammogram digital    screening performed at [facility]. July 23, 2012, bilateral MG mammogram digital screening performed at [facility]. June 27, 2011, bilateral MG mammogram digital    screening performed at [facility]. June 20, 2010,    bilateral MG mammogram digital screening performed at [facility]. June 16, 2009, bilateral MG mammogram digital    diagnostic bilat performed at [facility]iology. December 03, 2008, bilateral MG mammogram digital diagnostic bilat performed at [facility]. May 25, 2008, bilateral BC DIGITAL    DIAG MAMMO performed at [facility]iology. May 19, 2008,    bilateral digital screening mammogram performed at [facility].    The breast tissue is heterogeneously dense, which could obscure  underlying    abnormalities.       No suspicious masses, architectural distortions, or suspiciously clustered    microcalcification are identified in the right breast. No    significant changes when compared with prior studies.       2D digital mammography and tomosynthesis imaging were performed and    reviewed with CAD.       ASSESSMENT: Category 1 Negative       RECOMMENDATION: Routine screening mammogram in 1 year.          Review of Systems:  Review of Systems   Constitutional: Negative for activity change, diaphoresis, fatigue, fever and unexpected weight change.   HENT: Negative for sore throat, trouble swallowing and voice change.    Eyes: Negative for photophobia and visual disturbance.   Respiratory: Negative for cough, choking, shortness of breath, wheezing and stridor.    Cardiovascular: Negative for chest pain and palpitations.   Gastrointestinal: Negative for abdominal pain, constipation, nausea and  vomiting.   Endocrine: Negative for cold intolerance, heat intolerance, polydipsia, polyphagia and polyuria.   Musculoskeletal: Positive for gait problem, joint swelling and myalgias. Negative for arthralgias, neck pain and neck stiffness.   Skin: Negative for color change, pallor, rash and wound.   Allergic/Immunologic: Negative for immunocompromised state.   Neurological: Negative for tremors, weakness and numbness.   Hematological: Negative for adenopathy. Does not bruise/bleed easily.   Psychiatric/Behavioral: Positive for dysphoric mood. Negative for confusion and decreased concentration. The patient is not nervous/anxious.        Past Medical History:   Diagnosis Date   . Asthma    . Bleeding ulcer    . Chronic back pain    . Hiatal hernia    . Hyperlipidemia    . Hypertension    . Neuropathy ([place])    . Osteoarthritis      Past Surgical History:   Procedure Laterality Date   . APPENDECTOMY     . BRAIN SURGERY  2003   . BREAST BIOPSY Left 1974    benign   . BREAST BIOPSY Left 08/17/13    malignant   . BREAST SURGERY Left 10/13/13    mastectomy   . CAROTID ENDARTERECTOMY  2006   . CHOLECYSTECTOMY     . COLONOSCOPY     . HYSTERECTOMY  1977   . LUNG SURGERY Right 1976   . TONSILLECTOMY     . UPPER GASTROINTESTINAL ENDOSCOPY       Allergies   Allergen Reactions   . Codeine    . Demerol Hcl [Meperidine]    . Morphine    . Nsaids Other (See Comments)     Bleeding ulcer   . Percocet [Oxycodone-Acetaminophen]    . Vicodin [Hydrocodone-Acetaminophen]      BP (!) 155/74   Pulse 57   Ht 5' 4.02" (1.626 m)   Wt 115 lb (52.2 kg)   BMI 19.73 kg/m   Current Outpatient Prescriptions on File Prior to Visit   Medication Sig Dispense Refill   . tamoxifen (NOLVADEX) 20 MG tablet Take 1 tablet by mouth daily 30 tablet 5   . mometasone (ELOCON) 0.1 % cream APPLY TO AFFECTED AREA TWICE DAILY  5   . Calcium Carb-Cholecalciferol (CALCIUM + D3) 600-200 MG-UNIT TABS      . megestrol (MEGACE) 40 MG tablet      . omeprazole (PRILOSEC)  40 MG capsule      . cyclobenzaprine (FLEXERIL) 10 MG tablet Take 10 mg by  mouth 3 times daily as needed for Muscle spasms     . benztropine (COGENTIN) 0.5 MG tablet Take 0.5 mg by mouth 2 times daily     . hydrALAZINE (APRESOLINE) 25 MG tablet Take 25 mg by mouth 3 times daily     . gabapentin (NEURONTIN) 100 MG capsule Take 100 mg by mouth 3 times daily     . metoprolol (LOPRESSOR) 100 MG tablet Take 100 mg by mouth 2 times daily     . amLODIPine (NORVASC) 5 MG tablet Take 5 mg by mouth daily     . atorvastatin (LIPITOR) 20 MG tablet Take 20 mg by mouth daily     . vitamin D (ERGOCALCIFEROL) 50000 UNITS CAPS capsule Take 50,000 Units by mouth once a week     . carbidopa-levodopa (SINEMET) 25-100 MG per tablet Take 1 tablet by mouth 3 times daily     . traMADol (ULTRAM) 50 MG tablet Take 50 mg by mouth every 6 hours as needed for Pain     . lisinopril (PRINIVIL;ZESTRIL) 40 MG tablet Take 40 mg by mouth daily       No current facility-administered medications on file prior to visit.          Physical Exam:  Physical Exam   Constitutional: She is oriented to person, place, and time. She appears well-developed and well-nourished. No distress.   HENT:   Head: Normocephalic and atraumatic.   Eyes: Conjunctivae are normal. No scleral icterus.   Neck: Normal range of motion. Neck supple. No JVD present. No tracheal deviation present. No thyromegaly present.   Cardiovascular: Normal heart sounds.    Pulmonary/Chest: Effort normal and breath sounds normal. No stridor. No respiratory distress. She exhibits no mass, no tenderness, no bony tenderness, no deformity and no swelling. Right breast exhibits no inverted nipple, no mass, no nipple discharge, no skin change and no tenderness. Left breast exhibits no mass, no skin change and no tenderness. Breasts are symmetrical.       Musculoskeletal: Normal range of motion. She exhibits no edema or tenderness.   Lymphadenopathy:     She has no cervical adenopathy.        Right  cervical: No superficial cervical, no deep cervical and no posterior cervical adenopathy present.       Left cervical: No superficial cervical, no deep cervical and no posterior cervical adenopathy present.     She has no axillary adenopathy.        Right axillary: No pectoral and no lateral adenopathy present.        Left axillary: No pectoral and no lateral adenopathy present.       Right: No supraclavicular adenopathy present.        Left: No supraclavicular adenopathy present.   Neurological: She is alert and oriented to person, place, and time. No cranial nerve deficit.   Skin: Skin is warm and dry. No rash noted. She is not diaphoretic. No erythema. No pallor.   Psychiatric: She has a normal mood and affect. Her behavior is normal.        Assessment:   1. Malignant neoplasm of central portion of left female breast, unspecified estrogen receptor status ([place])    2. Encounter for follow-up surveillance of breast cancer          Plan:    Continue tamoxifen for 10 years  Patient is to continue yearly mammogram, physical exam and call if any questions or concerns. Maintain/ strive for healthy weight  and exercise regularly.      Lennon Alstrom MD  07/11/16  Please disregard any typographical errors. This note was dictated using voice recognition software.

## 2016-07-11 NOTE — Patient Instructions (Signed)
Right screening mammogram scheduled 08/15/16 11:15am [place] [address]

## 2016-08-15 ENCOUNTER — Ambulatory Visit: Primary: Family Medicine

## 2016-08-16 ENCOUNTER — Inpatient Hospital Stay: Admit: 2016-08-16 | Attending: Surgery | Primary: Family Medicine

## 2016-08-16 NOTE — Other (Unsigned)
Patient Acct Nbr: [id number]   Primary AUTH/CERT:   [place] Name: [employer] Plan name: [facility]  Primary Insurance Group Number: [facility]  Primary Insurance Plan Type: Health  Primary Insurance Policy Number: [id number]    Secondary AUTH/CERT:   [place] Name: [employer] Plan name: [facility] [employer] Second  Secondary Insurance Group Number: [employer]  Secondary Insurance Plan Type: [employer] Number: [id number]

## 2016-09-30 ENCOUNTER — Encounter: Payer: Self-pay | Admitting: Emergency Medicine

## 2016-09-30 ENCOUNTER — Emergency Department: Payer: 59

## 2016-09-30 ENCOUNTER — Emergency Department
Admission: EM | Admit: 2016-09-30 | Discharge: 2016-09-30 | Disposition: A | Discharge status: Home / Self Care | Payer: 59 | Attending: Student in an Organized Health Care Education/Training Program | Admitting: Student in an Organized Health Care Education/Training Program

## 2016-09-30 DIAGNOSIS — G44319 Acute post-traumatic headache, not intractable: Secondary | ICD-10-CM | POA: Insufficient documentation

## 2016-09-30 DIAGNOSIS — I10 Essential (primary) hypertension: Secondary | ICD-10-CM | POA: Diagnosis not present

## 2016-09-30 DIAGNOSIS — F419 Anxiety disorder, unspecified: Secondary | ICD-10-CM | POA: Diagnosis not present

## 2016-09-30 DIAGNOSIS — Z9889 Other specified postprocedural states: Secondary | ICD-10-CM | POA: Diagnosis not present

## 2016-09-30 HISTORY — DX: Unspecified injury of head, initial encounter: S09.90XA

## 2016-09-30 HISTORY — DX: Pure hypercholesterolemia, unspecified: E78.00

## 2016-09-30 HISTORY — DX: Essential (primary) hypertension: I10

## 2016-09-30 MED ORDER — BUTALBITAL-APAP-CAFFEINE 50-325-40 MG PO TABS: 1.0000 | ORAL_TABLET | Freq: Four times a day (QID) | ORAL | 0 refills | Status: AC | PRN

## 2016-09-30 MED ORDER — LORAZEPAM 0.5 MG PO TABS: 0.5000 mg | ORAL_TABLET | Freq: Two times a day (BID) | ORAL | 0 refills | Status: AC
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
## 2016-11-15 MED ORDER — TAMOXIFEN CITRATE 20 MG PO TABS
20 MG | ORAL_TABLET | Freq: Every day | ORAL | 5 refills | Status: DC
Start: 2016-11-15 — End: 2017-06-05

## 2016-11-30 ENCOUNTER — Inpatient Hospital Stay: Admit: 2016-11-30 | Attending: Family Medicine | Primary: Family Medicine

## 2016-11-30 LAB — CBC WITH AUTO DIFFERENTIAL
Absolute Baso #: 0.1 10*3/uL (ref 0.0–0.2)
Absolute Eos #: 0.4 10*3/uL (ref 0.0–0.5)
Absolute Lymph #: 2.1 10*3/uL (ref 1.0–4.3)
Absolute Mono #: 0.6 10*3/uL (ref 0.0–0.8)
Absolute Neut #: 4.8 10*3/uL (ref 1.8–7.0)
Basophils: 0.7 % (ref 0.0–2.0)
Eosinophils: 4.8 % (ref 1.0–6.0)
Granulocytes %: 60.1 % (ref 40.0–80.0)
Hematocrit: 38.3 % (ref 35.0–47.0)
Hemoglobin: 13 g/dL (ref 11.7–16.0)
Lymphocyte %: 26.9 % (ref 20.0–40.0)
MCH: 33 pg (ref 26.0–34.0)
MCHC: 33.9 % (ref 32.0–36.0)
MCV: 97.1 fL (ref 79.0–98.0)
MPV: 9.2 fL (ref 7.4–10.4)
Monocytes: 7.5 % (ref 2.0–10.0)
Platelets: 198 10*3/uL (ref 140–440)
RBC: 3.94 10*6/uL (ref 3.80–5.20)
RDW: 15.1 % — ABNORMAL HIGH (ref 11.5–14.5)
WBC: 7.9 10*3/uL (ref 3.6–10.7)

## 2016-11-30 LAB — COMPREHENSIVE METABOLIC PANEL
ALT: 26 U/L (ref 13–69)
AST: 22 U/L (ref 15–46)
Albumin,Serum: 4.4 g/dL (ref 3.5–5.0)
Alkaline Phosphatase: 57 U/L (ref 38–126)
Anion Gap: 9 NA
BUN: 12 mg/dL (ref 7–20)
CO2: 27 mmol/L (ref 22–30)
Calcium: 9.1 mg/dL (ref 8.4–10.4)
Chloride: 107 mmol/L (ref 98–107)
Creatinine: 0.85 mg/dL (ref 0.52–1.25)
EGFR IF NonAfrican American: >60 mL/min (ref >60)
Glucose: 70 mg/dL (ref 70–100)
Potassium: 4.4 mmol/L (ref 3.5–5.1)
Sodium: 143 mmol/L (ref 137–145)
Total Bilirubin: 0.7 mg/dL (ref 0.2–1.3)
Total Protein: 7.1 g/dL (ref 6.3–8.2)
eGFR African American: >60 mL/min (ref >60)

## 2016-11-30 LAB — LIPID PANEL
Chol/HDL Ratio: 2 NA
Cholesterol: 136 mg/dL (ref <200)
HDL: 70 mg/dL — ABNORMAL HIGH (ref 40–60)
LDL Cholesterol: 52 mg/dL (ref <100)
Triglycerides: 70 mg/dL (ref <150)

## 2016-11-30 LAB — VITAMIN D 25 HYDROXY: Vit D, 25-Hydroxy: 57 ng/mL (ref 30–100)

## 2016-11-30 LAB — TSH: TSH: 1.474 u[IU]/mL (ref 0.465–4.680)

## 2016-11-30 LAB — MAGNESIUM: Magnesium: 1.7 mg/dL (ref 1.6–2.3)

## 2016-11-30 NOTE — Other (Unsigned)
Patient Acct Nbr: [id number]   Primary AUTH/CERT:   [place] Name: [employer] Plan name: [facility]  Primary Insurance Group Number: [facility]  Primary Insurance Plan Type: Health  Primary Insurance Policy Number: [id number]    Secondary AUTH/CERT:   [place] Name: [place] name: [facility] [employer]CrMca[employer] Second  Secondary Insurance Group Number: [employer]  Secondary Insurance Plan Type: [employer] Number: [id number]

## 2016-11-30 NOTE — Other (Unsigned)
Patient Acct Nbr: [id number]   Primary AUTH/CERT:   [place] Name: [place] name: [facility]  Primary Insurance Group Number: [facility]  Primary Insurance Plan Type: Health  Primary Insurance Policy Number: [id number]    Secondary AUTH/CERT:   [place] Name: [place] name: [facility] [employer]Cr[employer]ca[employer] Second  Secondary Insurance Group Number: [employer]  Secondary Insurance Plan Type: [employer] Number: [id number]

## 2017-01-08 ENCOUNTER — Telehealth

## 2017-01-08 MED ORDER — BREAST PROSTHESIS
0 refills | Status: DC
Start: 2017-01-08 — End: 2018-12-16

## 2017-01-08 MED ORDER — SURGICAL BRA
0 refills | Status: DC
Start: 2017-01-08 — End: 2018-12-16

## 2017-01-08 NOTE — Telephone Encounter (Signed)
done

## 2017-01-08 NOTE — Telephone Encounter (Signed)
Orders faxed to [facility].

## 2017-01-08 NOTE — Telephone Encounter (Signed)
Pt misplaced her orders for bra and prosth. She has an appt on fri. Please print and sign new orders in the office.

## 2017-03-05 ENCOUNTER — Encounter: Attending: Hematology & Oncology | Primary: Family Medicine

## 2017-05-30 NOTE — Telephone Encounter (Signed)
Recall mailed to pt to schedule appointment with Dr Nonda Lou for yearly breast exam due April 2019

## 2017-06-05 NOTE — Telephone Encounter (Signed)
Rx sent to dr. Lyda Kalata

## 2017-06-05 NOTE — Telephone Encounter (Signed)
Pt states she had some confusion as to who should refill her tamoxifen    She is requesting a refill be sent to [facility] on [address].  She is completely out of this medication.  Pt has OV on 06-19-17

## 2017-06-06 MED ORDER — TAMOXIFEN CITRATE 20 MG PO TABS
20 MG | ORAL_TABLET | Freq: Every day | ORAL | 5 refills | Status: DC
Start: 2017-06-06 — End: 2017-11-11

## 2017-06-19 ENCOUNTER — Encounter: Attending: Hematology & Oncology | Primary: Family Medicine

## 2017-07-03 ENCOUNTER — Ambulatory Visit
Admit: 2017-07-03 | Discharge: 2017-07-03 | Payer: PRIVATE HEALTH INSURANCE | Attending: Surgery | Primary: Family Medicine

## 2017-07-03 DIAGNOSIS — C50112 Malignant neoplasm of central portion of left female breast: Secondary | ICD-10-CM

## 2017-07-03 NOTE — Progress Notes (Signed)
Chief Complaint   Patient presents with   . 1 Year Follow Up     yearly breast exam      History of Present Illness:  Dawn Padilla is a 67 y.o. female Dawn Padilla is a 67 y.o. female who presents for survivorship visit:    shehas the following oncology history  1. 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  2. Currently on tamoxifen since 11/16/2013.  Treatments include:  Surgery-10/13/13 left mastectomy  Chemotherapy- Oncotype=10  Endocrine therapy-tamoxifen, tolerating well  Radiation therapy-none  Last mammogram date 08/16/16.BIRADS 1  No new breast concerns. No wt loss or cough  Has had wt loss due to dental issues-and infection. Will be scheduled to have all of her teeth removed soon      Imaging:  Exam Date/Time    08/16/2016 08:16:00 EDT                  Exam         MG Breast Tomosynthesis Scr Right             Ordering Physician  Edmund Hilda                    Accession Number   [id number]                          HEN2 DPOEU    [id number] (MG MAMMO 2D SCREENING), [phone] (MG Breast Tomosynthesis Scr Right)          Reason For Exam    screen          Report    PATIENT HISTORY: Patient is postmenopausal and has history of cancer in the    left breast at age 15.    Family history of unknown cancer in mother, breast cancer at age 4 in    sister, colorectal cancer at age 14 in maternal grandmother.       Malignant mastectomy of the left breast, October 13, 2013.    Malignant excisional biopsy of the left breast, Aug 17, 2013.    Benign excisional biopsy of the left breast, 1969.       Took estrogen for 34 years beginning at age 89. Taking tamoxifen beginning    at age 89.    Patient has never smoked. Patient's BMI is 18.9.       TIME SINCE LAST MAMMOGRAM: Last mammogram was performed 1 year ago.       REASON FOR EXAM: screening, asymptomatic.           PROCEDURE: MG BREAST TOMOSYNTHESIS SCR RIGHT: Aug 16, 2016 - ACCESSION #:    [id number]    2D/3D Procedure    3D CC and MLO view(s) were taken of the right breast.    2D CC and MLO view(s) were taken of the right breast.       Prior study comparison: Aug 03, 2015, MG breast tomosynthesis scr right    performed at [facility]. July 29, 2014, MG    breast tomosynthesis right performed at [facility]. Aug 06, 2013, MG mammogram digital diagnostic left performed at [facility]. July 24, 2013, bilateral MG mammogram digital    screening performed at [facility].       TISSUE DENSITY: The breast tissue is heterogeneously dense, which could  obscure underlying abnormalities.       FINDINGS:                                  No suspicious masses, architectural distortions or suspiciously clustered    microcalcifications are identified. There is no evidence of skin    thickening or nipple retraction.       There are no significant changes when compared with prior studies.       Markings on images:    BB's = Nipples; skin lesions    Open circle = Palpable    Line = Scar       2D digital mammography and tomosynthesis imaging were performed and    reviewed with CAD.       ASSESSMENT: Category 1 Negative    No mammographic evidence of malignancy.       RECOMMENDATION: Routine screening mammogram of the right breast in 1 year.         Exam Date/Time    11/30/2016 11:35:00 EDT                  Exam         OT Bone Density DEXA Axial Skeleton            Ordering Physician  Cherlynn Polo                     Accession Number   [id number]                          [place] Codes    [phone] ()          Reason For Exam    screening for osteoporosis          Report       DXA BONE DENSITOMETRY:       CLINICAL INDICATION: Asymptomatic post-menopausal  status.        COMPARISON: 07/24/2013.       TECHNIQUE: Quantitative bone mineral densitometry of the hip and    lumbar spine was performed with a dual energy x-ray observed    absorptiometry device - [employer]. Regions of interest    were obtained through the proximal femur and compared to the normal    value of young adult women. Regions of interest were also obtained    through the lumbar vertebrae with an average value determined and    compared to the normal value of young adult woman. The difference    between your measured bone density and the bone density of a normal    young woman is expressed in standard deviations as the T score.    Similarly, your measured bone density is also compared to age and race    matched values, and expressed in standard deviations as the Z score.       According to [employer] criteria:    T-score of -1.0 or higher is normal.    T-score between -1.1 to < -2.5 is low bone density or "osteopenia."    T-score of -2.5 or lower is abnormally low, compatible with    "osteoporosis."    T-score of -2.5 or less plus fragility fracture indicates "severe    osteoporosis."       FINDINGS:     Femoral neck: Left.    Density: 0.863 g/cm2.  T-score: -1.3.    Z-score: -0.2.       Total Hip: Left.    Density: 0.721 g/cm2.    T-score: -2.3.                      Z-score: -1.4.    Comparison from prior examination: Increased 4.7%.       Spine: L1-L4    Density 1.048 g/cm2.     T-score: -1.1.    Z-score: 0.5.    Comparison from prior examination: Increased 5.3%.             IMPRESSION: Osteopenia.       FRACTURE RISK: The estimated 10 year risk for a hip fracture is 1.1%    and for a major osteoporosis-related fracture is 8.3%. (FRAX web    version 3.11).               Review of Systems:  Review of Systems   Constitutional: Positive for fatigue and unexpected weight change. Negative for activity change, diaphoresis and fever.   HENT:  Positive for dental problem. Negative for sore throat, trouble swallowing and voice change.    Eyes: Negative for photophobia and visual disturbance.   Respiratory: Negative for cough, choking, shortness of breath, wheezing and stridor.    Cardiovascular: Negative for chest pain and palpitations.   Gastrointestinal: Negative for abdominal pain, constipation, nausea and vomiting.   Endocrine: Negative for cold intolerance, heat intolerance, polydipsia, polyphagia and polyuria.   Musculoskeletal: Positive for arthralgias and gait problem. Negative for myalgias, neck pain and neck stiffness.   Skin: Negative for color change, pallor, rash and wound.   Allergic/Immunologic: Negative for immunocompromised state.   Neurological: Negative for tremors, weakness and numbness.   Hematological: Negative for adenopathy. Does not bruise/bleed easily.   Psychiatric/Behavioral: Negative for confusion and decreased concentration. The patient is not nervous/anxious.        Past Medical History:   Diagnosis Date   . Asthma    . Bleeding ulcer    . Chronic back pain    . Hiatal hernia    . Hyperlipidemia    . Hypertension    . Neuropathy    . Osteoarthritis      Past Surgical History:   Procedure Laterality Date   . APPENDECTOMY     . BRAIN SURGERY  2003   . BREAST BIOPSY Left 1974    benign   . BREAST BIOPSY Left 08/17/13    malignant   . BREAST SURGERY Left 10/13/13    mastectomy   . CAROTID ENDARTERECTOMY  2006   . CHOLECYSTECTOMY     . COLONOSCOPY     . HYSTERECTOMY  1977   . LUNG SURGERY Right 1976   . TONSILLECTOMY     . UPPER GASTROINTESTINAL ENDOSCOPY       Allergies   Allergen Reactions   . Codeine    . Demerol Hcl [Meperidine]    . Morphine    . Nsaids Other (See Comments)     Bleeding ulcer   . Percocet [Oxycodone-Acetaminophen]    . Vicodin [Hydrocodone-Acetaminophen]      BP (!) 160/75   Pulse 80   Ht 5' 4"  (1.626 m)   Wt 98 lb (44.5 kg)   BMI 16.82 kg/m   Current Outpatient Medications on File Prior to Visit    Medication Sig Dispense Refill   . PROAIR HFA 108 (90 Base) MCG/ACT inhaler INHALE TWO PUFFS EVERY 4 HOURS  AS NEEDED  3   . tamoxifen (NOLVADEX) 20 MG tablet Take 1 tablet by mouth daily 30 tablet 5   . Breast Prosthesis MISC by Does not apply route 1 each 0   . Surgical Bra MISC by Does not apply route 12 each 0   . mometasone (ELOCON) 0.1 % cream APPLY TO AFFECTED AREA TWICE DAILY  5   . Calcium Carb-Cholecalciferol (CALCIUM + D3) 600-200 MG-UNIT TABS      . megestrol (MEGACE) 40 MG tablet      . omeprazole (PRILOSEC) 40 MG capsule      . cyclobenzaprine (FLEXERIL) 10 MG tablet Take 10 mg by mouth 3 times daily as needed for Muscle spasms     . benztropine (COGENTIN) 0.5 MG tablet Take 0.5 mg by mouth 2 times daily     . hydrALAZINE (APRESOLINE) 25 MG tablet Take 25 mg by mouth 3 times daily     . gabapentin (NEURONTIN) 100 MG capsule Take 100 mg by mouth 3 times daily     . metoprolol (LOPRESSOR) 100 MG tablet Take 100 mg by mouth 2 times daily     . amLODIPine (NORVASC) 5 MG tablet Take 5 mg by mouth daily     . atorvastatin (LIPITOR) 20 MG tablet Take 20 mg by mouth daily     . vitamin D (ERGOCALCIFEROL) 50000 UNITS CAPS capsule Take 50,000 Units by mouth once a week     . carbidopa-levodopa (SINEMET) 25-100 MG per tablet Take 1 tablet by mouth 3 times daily     . traMADol (ULTRAM) 50 MG tablet Take 50 mg by mouth every 6 hours as needed for Pain     . lisinopril (PRINIVIL;ZESTRIL) 40 MG tablet Take 40 mg by mouth daily     . alendronate (FOSAMAX) 70 MG tablet TAKE ONE TABLET BY MOUTH ONCE A WEEK ON MONDAY  11     No current facility-administered medications on file prior to visit.          Physical Exam:  Physical Exam   Constitutional: She is oriented to person, place, and time. She appears well-developed and well-nourished. No distress.   HENT:   Head: Normocephalic and atraumatic.   Eyes: Conjunctivae are normal. No scleral icterus.   Neck: Normal range of motion. Neck supple. No JVD present. No tracheal  deviation present. No thyromegaly present.   Cardiovascular: Normal heart sounds.   Pulmonary/Chest: Effort normal and breath sounds normal. No stridor. No respiratory distress. She exhibits no mass, no tenderness, no bony tenderness, no deformity and no swelling. Right breast exhibits no inverted nipple, no mass, no nipple discharge, no skin change and no tenderness. Left breast exhibits no mass, no skin change and no tenderness. Breasts are symmetrical.       Musculoskeletal: Normal range of motion. She exhibits no edema or tenderness.   Lymphadenopathy:     She has no cervical adenopathy.        Right cervical: No superficial cervical, no deep cervical and no posterior cervical adenopathy present.       Left cervical: No superficial cervical, no deep cervical and no posterior cervical adenopathy present.     She has no axillary adenopathy.        Right axillary: No pectoral and no lateral adenopathy present.        Left axillary: No pectoral and no lateral adenopathy present.       Right: No supraclavicular adenopathy present.  Left: No supraclavicular adenopathy present.   Neurological: She is alert and oriented to person, place, and time. No cranial nerve deficit.   Skin: Skin is warm and dry. No rash noted. She is not diaphoretic. No erythema. No pallor.   Psychiatric: She has a normal mood and affect. Her behavior is normal.        Assessment:   1. Malignant neoplasm of central portion of left female breast, unspecified estrogen receptor status ([place])    2. Encounter for follow-up surveillance of breast cancer    Cancer Staging  Malignant neoplasm of left breast [facility])  Staging form: Breast, AJCC 7th Edition  - Clinical stage from 06/30/2014: Stage IIA (T1c, N1, M0) - Signed by Lennon Alstrom, MD on 06/30/2014        Plan:    Continue tamoxifen  Patient is to continue yearly mammogram, physical exam and call if any questions or concerns. Maintain/ strive for healthy weight and exercise  regularly.      Lennon Alstrom MD  07/03/17  Please disregard any typographical errors. This note was dictated using voice recognition software.

## 2017-07-12 ENCOUNTER — Ambulatory Visit
Admit: 2017-07-12 | Discharge: 2017-07-12 | Payer: PRIVATE HEALTH INSURANCE | Attending: Hematology & Oncology | Primary: Family Medicine

## 2017-07-12 DIAGNOSIS — Z170 Estrogen receptor positive status [ER+]: Secondary | ICD-10-CM

## 2017-07-12 NOTE — Progress Notes (Signed)
HPI  Dawn Padilla is a 67 y.o.  female who comes in today for follow-up. she feels well.  Apettite  and energy levels are good.  She is complaining of mild hot flashes and hair thinning.    she has the following oncology history  1.  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  2.  Currently on tamoxifen since 11/16/2013.      Review of Systems   Constitutional: Negative for fatigue, fever and unexpected weight change.   HENT: Negative for mouth sores and trouble swallowing.    Eyes: Negative for photophobia and visual disturbance.   Respiratory: Negative for cough and shortness of breath.    Cardiovascular: Negative for chest pain.   Gastrointestinal: Negative for abdominal pain, diarrhea, nausea and vomiting.   Genitourinary: Negative for dysuria and hematuria.   Musculoskeletal: Negative for arthralgias and myalgias.   Skin: Negative for rash.   Neurological: Negative for seizures, light-headedness and headaches.   Hematological: Negative for adenopathy. Does not bruise/bleed easily.   Psychiatric/Behavioral: Negative for sleep disturbance.         Family History   Problem Relation Age of Onset   . Cancer Mother         lung   . Cancer Father         stomach   . Cancer Sister         breast   . Cancer Maternal Grandmother         colon     Social History     Tobacco Use   . Smoking status: Never Smoker   . Smokeless tobacco: Never Used   Substance Use Topics   . Alcohol use: No       There were no vitals filed for this visit.  Physical Exam   Constitutional: She is oriented to person, place, and time. No distress.   HENT:   Head: Normocephalic.   Mouth/Throat: No oropharyngeal exudate.   Eyes: Pupils are equal, round, and reactive to light. EOM are normal. No scleral icterus.   Neck: Neck supple.   Cardiovascular: Normal rate, regular rhythm and normal heart sounds.   Pulmonary/Chest: Breath sounds normal.   Abdominal: Soft. She exhibits no distension and  no mass. There is no tenderness. There is no rebound and no guarding.   Musculoskeletal: Normal range of motion.   Lymphadenopathy:     She has no cervical adenopathy.   Neurological: She is alert and oriented to person, place, and time. No cranial nerve deficit.   Skin: No rash noted.   Psychiatric: She has a normal mood and affect.   Breast exam: Left breast mastectomy.  No tumor nodules.  Right breast reveals no lumps.  No axilla lymphadenopathy.    Lab Results   Component Value Date    WBC 7.9 11/30/2016    HGB 13.0 11/30/2016    HCT 38.3 11/30/2016    MCV 97.1 11/30/2016    PLT 198 11/30/2016   Last mammogram May 2018 is negative.    Assessment/Plan:  Dawn Padilla was seen today for follow-up.    Diagnoses and all orders for this visit:    Malignant neoplasm of central portion of left breast in female, estrogen receptor positive ([place])    1. she appears to be in complete remission. Will do only symptom guided imaging.    2. 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 daily.  Treatment intent is curative.  Timing is adjuvant.  Planned duration is 10 years.  Response Rate: NA since adjuvant  I have discussed some of the side efffects that include but not limited to arthralgias, myalgias, vaginal dryness, hair thinning, hot flashes, osteopenia.  Prognosis: Five-year disease-free survival 85%.  Absolute benefit of tamoxifen 4%.  NCCN guidelines were used.  She consents to treatment. Shared decision making was performed.  Compliance assessed. Patient is taking medication at appropriate dose and schedule.    3. [name] on diet and exercise.  Patient verbalizes understanding and agrees with the plan.    4. Next Mammogram due May 2019.  Follow-up in one year for office visit.

## 2017-07-16 ENCOUNTER — Inpatient Hospital Stay
Admit: 2017-07-16 | Discharge: 2017-07-16 | Disposition: A | Discharge status: Home / Self Care | Attending: Emergency Medicine

## 2017-07-16 DIAGNOSIS — R112 Nausea with vomiting, unspecified: Secondary | ICD-10-CM

## 2017-07-16 LAB — CBC WITH AUTO DIFFERENTIAL
Basophils %: 0.1 % (ref 0.0–2.0)
Basophils Absolute: 0 10*3/uL (ref 0.0–0.2)
Eosinophils Absolute: 0.4 10*3/uL (ref 0.0–0.5)
Eosinophils: 2.7 % (ref 1.0–6.0)
Granulocytes %: 88.3 % — ABNORMAL HIGH (ref 40.0–80.0)
Hematocrit: 40.6 % (ref 35.0–47.0)
Hemoglobin: 13.6 g/dL (ref 11.7–16.0)
Lymphocyte %: 4.5 % — ABNORMAL LOW (ref 20.0–40.0)
Lymphocytes Absolute: 0.7 10*3/uL — ABNORMAL LOW (ref 1.0–4.3)
MCH: 33.1 pg (ref 26.0–34.0)
MCHC: 33.6 % (ref 32.0–36.0)
MCV: 98.5 fL — ABNORMAL HIGH (ref 79.0–98.0)
MPV: 7.8 fL (ref 7.4–10.4)
Monocytes %: 4.4 % (ref 2.0–10.0)
Monocytes Absolute: 0.6 10*3/uL (ref 0.0–0.8)
Neutrophils Absolute: 12.7 10*3/uL — ABNORMAL HIGH (ref 1.8–7.0)
Platelets: 215 10*3/uL (ref 140–440)
RBC: 4.12 10*6/uL (ref 3.80–5.20)
RDW: 15 % — ABNORMAL HIGH (ref 11.5–14.5)
WBC: 14.4 10*3/uL — ABNORMAL HIGH (ref 3.6–10.7)

## 2017-07-16 LAB — LIPASE: Lipase: 48 U/L (ref 23–300)

## 2017-07-16 LAB — ADD ON LAB TEST

## 2017-07-16 LAB — HEPATIC FUNCTION PANEL
ALT: 34 U/L (ref 13–69)
AST: 34 U/L (ref 15–46)
Albumin,Serum: 4.3 g/dL (ref 3.5–5.0)
Alkaline Phosphatase: 55 U/L (ref 38–126)
Bilirubin, Direct: 0 mg/dL (ref 0.0–0.3)
Total Bilirubin: 0.5 mg/dL (ref 0.2–1.3)
Total Protein: 7.6 g/dL (ref 6.3–8.2)

## 2017-07-16 LAB — URINALYSIS
Bilirubin, Urine: NEGATIVE NA
LEUKOCYTES, UA: NEGATIVE NA
Nitrite, Urine: NEGATIVE NA
Occult Blood,Urine: NEGATIVE {RBC}/uL
Specific Gravity, Urine: 1.005 NA (ref 1.005–1.030)
Total Protein, Urine: NEGATIVE mg/dL
pH, Urine: 7 NA (ref 5.0–8.0)

## 2017-07-16 LAB — BASIC METABOLIC PANEL
Anion Gap: 10 NA
BUN: 15 mg/dL (ref 7–20)
CO2: 24 mmol/L (ref 22–30)
Calcium: 9.2 mg/dL (ref 8.4–10.4)
Chloride: 108 mmol/L — ABNORMAL HIGH (ref 98–107)
Creatinine: 0.81 mg/dL (ref 0.52–1.25)
Glucose: 126 mg/dL — ABNORMAL HIGH (ref 70–100)
Potassium: 3.4 mmol/L — ABNORMAL LOW (ref 3.5–5.1)
Sodium: 142 mmol/L (ref 135–145)
eGFR AA: >60 mL/min (ref >60)
eGFR NON-AA: >60 mL/min (ref >60)

## 2017-07-16 LAB — URINE DRUG SCREEN
Amphetamine, Urine: NEGATIVE NA
Barbiturates, Urine: NEGATIVE NA
Benzodiazepine Screen, Urine: NEGATIVE NA
Cocaine Metabolite, Urine: NEGATIVE NA
Methadone, Urine: NEGATIVE NA
Opiates, Urine: NEGATIVE NA
Oxycodone Screen, Ur: NEGATIVE NA
Phencyclidine, Urine: NEGATIVE NA

## 2017-07-16 LAB — LACTIC ACID
Lactic Acid: 2.5 mmol/L (ref 0.7–2.0)
Lactic Acid: 0.6 mmol/L — ABNORMAL LOW (ref 0.7–2.0)

## 2017-07-16 LAB — CANNABINOID(S),QUALITATIVE, UR: THC: POSITIVE NA

## 2017-07-16 LAB — C. DIFFICILE TOXIN MOLECULAR: CLOSTRIDIUM DIFFICILE: NEGATIVE

## 2017-07-16 MED ORDER — PROMETHAZINE HCL 25 MG/ML IJ SOLN
25 MG/ML | Freq: Once | INTRAMUSCULAR | Status: AC
Start: 2017-07-16 — End: 2017-07-16
  Administered 2017-07-16: 08:00:00 12.5 mg via INTRAVENOUS

## 2017-07-16 MED ORDER — BENZTROPINE MESYLATE 0.5 MG PO TABS
0.5 MG | Freq: Two times a day (BID) | ORAL | Status: DC
Start: 2017-07-16 — End: 2017-07-17
  Administered 2017-07-17 (×2): 0.5 mg via ORAL

## 2017-07-16 MED ORDER — ACETAMINOPHEN 500 MG PO TABS
500 MG | Freq: Once | ORAL | Status: AC
Start: 2017-07-16 — End: 2017-07-16
  Administered 2017-07-16: 08:00:00 1000 mg via ORAL

## 2017-07-16 MED ORDER — METOPROLOL TARTRATE 25 MG PO TABS
25 MG | Freq: Two times a day (BID) | ORAL | Status: DC
Start: 2017-07-16 — End: 2017-07-17
  Administered 2017-07-17 (×2): 50 mg via ORAL

## 2017-07-16 MED ORDER — GABAPENTIN 100 MG PO CAPS
100 MG | Freq: Three times a day (TID) | ORAL | Status: DC
Start: 2017-07-16 — End: 2017-07-17
  Administered 2017-07-17: 100 mg via ORAL

## 2017-07-16 MED ORDER — ALBUTEROL SULFATE HFA 108 (90 BASE) MCG/ACT IN AERS
108 (90 Base) MCG/ACT | Freq: Four times a day (QID) | RESPIRATORY_TRACT | Status: DC | PRN
Start: 2017-07-16 — End: 2017-07-17

## 2017-07-16 MED ORDER — NORMAL SALINE FLUSH 0.9 % IV SOLN
0.9 % | Freq: Two times a day (BID) | INTRAVENOUS | Status: DC
Start: 2017-07-16 — End: 2017-07-17
  Administered 2017-07-17: 10 mL via INTRAVENOUS

## 2017-07-16 MED ORDER — PANTOPRAZOLE SODIUM 40 MG PO TBEC
40 MG | Freq: Every day | ORAL | Status: DC
Start: 2017-07-16 — End: 2017-07-17
  Administered 2017-07-17: 10:00:00 40 mg via ORAL

## 2017-07-16 MED ORDER — SODIUM CHLORIDE 0.9 % IV BOLUS
0.9 % | Freq: Once | INTRAVENOUS | Status: AC
Start: 2017-07-16 — End: 2017-07-16
  Administered 2017-07-16: 08:00:00 1000 mL via INTRAVENOUS

## 2017-07-16 MED ORDER — LORAZEPAM 2 MG/ML IJ SOLN
2 MG/ML | Freq: Once | INTRAMUSCULAR | Status: AC
Start: 2017-07-16 — End: 2017-07-16
  Administered 2017-07-16: 10:00:00 1 mg via INTRAVENOUS

## 2017-07-16 MED ORDER — KCL IN DEXTROSE-NACL 20-5-0.9 MEQ/L-%-% IV SOLN
INTRAVENOUS | Status: DC
Start: 2017-07-16 — End: 2017-07-17
  Administered 2017-07-16 – 2017-07-17 (×3): via INTRAVENOUS

## 2017-07-16 MED ORDER — CARBIDOPA-LEVODOPA 25-100 MG PO TABS
25-100 MG | Freq: Every day | ORAL | Status: DC
Start: 2017-07-16 — End: 2017-07-17
  Administered 2017-07-17 (×2): 1 via ORAL

## 2017-07-16 MED ORDER — NORMAL SALINE FLUSH 0.9 % IV SOLN
0.9 % | Freq: Three times a day (TID) | INTRAVENOUS | Status: DC
Start: 2017-07-16 — End: 2017-07-17
  Administered 2017-07-17: 07:00:00 3 mL via INTRAVENOUS

## 2017-07-16 MED ORDER — ONDANSETRON HCL 4 MG/2ML IJ SOLN
4 MG/2ML | Freq: Four times a day (QID) | INTRAMUSCULAR | Status: DC | PRN
Start: 2017-07-16 — End: 2017-07-17
  Administered 2017-07-16: 21:00:00 4 mg via INTRAVENOUS

## 2017-07-16 MED ORDER — POTASSIUM CHLORIDE CRYS ER 10 MEQ PO TBCR
10 MEQ | ORAL | Status: DC | PRN
Start: 2017-07-16 — End: 2017-07-17
  Administered 2017-07-16: 21:00:00 40 meq via ORAL

## 2017-07-16 MED ORDER — NORMAL SALINE FLUSH 0.9 % IV SOLN
0.9 % | INTRAVENOUS | Status: DC | PRN
Start: 2017-07-16 — End: 2017-07-17

## 2017-07-16 MED ORDER — TAMOXIFEN CITRATE 10 MG PO TABS
10 MG | Freq: Every day | ORAL | Status: DC
Start: 2017-07-16 — End: 2017-07-17
  Administered 2017-07-16 – 2017-07-17 (×2): 20 mg via ORAL

## 2017-07-16 MED ORDER — ENOXAPARIN SODIUM 40 MG/0.4ML SC SOLN
40 MG/0.4ML | Freq: Every day | SUBCUTANEOUS | Status: DC
Start: 2017-07-16 — End: 2017-07-17
  Administered 2017-07-17 (×2): 40 mg via SUBCUTANEOUS

## 2017-07-16 MED ORDER — TRAMADOL HCL 50 MG PO TABS
50 MG | Freq: Four times a day (QID) | ORAL | Status: DC | PRN
Start: 2017-07-16 — End: 2017-07-17
  Administered 2017-07-16 – 2017-07-17 (×2): 50 mg via ORAL

## 2017-07-16 MED ORDER — PNEUMOCOCCAL 13-VAL CONJ VACC IM SUSP
Freq: Once | INTRAMUSCULAR | Status: AC
Start: 2017-07-16 — End: 2017-07-16
  Administered 2017-07-16: 21:00:00 0.5 mL via INTRAMUSCULAR

## 2017-07-16 MED ORDER — POTASSIUM CHLORIDE 10 MEQ/100ML IV SOLN
10 MEQ/0ML | INTRAVENOUS | Status: DC | PRN
Start: 2017-07-16 — End: 2017-07-17

## 2017-07-16 MED ORDER — POTASSIUM CHLORIDE 20 MEQ PO PACK
20 MEQ | ORAL | Status: DC | PRN
Start: 2017-07-16 — End: 2017-07-17

## 2017-07-16 MED ORDER — CYCLOBENZAPRINE HCL 10 MG PO TABS
10 MG | Freq: Three times a day (TID) | ORAL | Status: DC | PRN
Start: 2017-07-16 — End: 2017-07-17
  Administered 2017-07-17: 13:00:00 10 mg via ORAL

## 2017-07-16 MED FILL — KCL IN DEXTROSE-NACL 20-5-0.9 MEQ/L-%-% IV SOLN: 20-5-0.9 MEQ/L-%-% | INTRAVENOUS | Qty: 1000

## 2017-07-16 MED FILL — GABAPENTIN 100 MG PO CAPS: 100 mg | ORAL | Qty: 1

## 2017-07-16 MED FILL — TAMOXIFEN CITRATE 10 MG PO TABS: 10 mg | ORAL | Qty: 2

## 2017-07-16 MED FILL — CYCLOBENZAPRINE HCL 10 MG PO TABS: 10 mg | ORAL | Qty: 1

## 2017-07-16 MED FILL — BENZTROPINE MESYLATE 0.5 MG PO TABS: 0.5 mg | ORAL | Qty: 1

## 2017-07-16 MED FILL — PREVNAR 13 IM SUSP: INTRAMUSCULAR | Qty: 0.5

## 2017-07-16 MED FILL — MAPAP 500 MG PO TABS: 500 mg | ORAL | Qty: 2

## 2017-07-16 MED FILL — TRAMADOL HCL 50 MG PO TABS: 50 mg | ORAL | Qty: 1

## 2017-07-16 MED FILL — POTASSIUM CHLORIDE CRYS ER 10 MEQ PO TBCR: 10 meq | ORAL | Qty: 4

## 2017-07-16 MED FILL — METOPROLOL TARTRATE 25 MG PO TABS: 25 mg | ORAL | Qty: 2

## 2017-07-16 MED FILL — CARBIDOPA-LEVODOPA 25-100 MG PO TABS: 25-100 mg | ORAL | Qty: 1

## 2017-07-16 MED FILL — ONDANSETRON HCL 4 MG/2ML IJ SOLN: 4 MG/2ML | INTRAMUSCULAR | Qty: 2

## 2017-07-16 MED FILL — LORAZEPAM 2 MG/ML IJ SOLN: 2 mg/mL | INTRAMUSCULAR | Qty: 1

## 2017-07-16 MED FILL — ALBUTEROL SULFATE HFA 108 (90 BASE) MCG/ACT IN AERS: 108 (90 Base) MCG/ACT | RESPIRATORY_TRACT | Qty: 1.2

## 2017-07-16 MED FILL — PHENERGAN 25 MG/ML IJ SOLN: 25 MG/ML | INTRAMUSCULAR | Qty: 1

## 2017-07-16 NOTE — ED Notes (Signed)
Spoke to lab about repeat Lactate.  Blood drawn and sent prior to order, so order placed as add-on.     Dickie La, RN  07/16/17 [phone]

## 2017-07-16 NOTE — Progress Notes (Signed)
Orders placed by dr Oneita Jolly.

## 2017-07-16 NOTE — ED Notes (Signed)
Finally convinced pt to clean up.    Pt in gown, out of soiled clothes, linen changed.     Lajean Manes, RN  07/16/17 [phone]

## 2017-07-16 NOTE — ED Notes (Signed)
Pt in bed, eyes closed     Kristen Loader, RN  07/16/17 [phone]

## 2017-07-16 NOTE — Care Coordination-Inpatient (Signed)
SPOKE WITH PT, HERE WITH NAUSEA, VOMITING, DIARRHEA.  INTRODUCED SELF AND EXPLAINED ROLE.  DISCHARGE CHECKLIST GIVEN TO PT.  PT LIVES WITH HER 69 YO GRANDDAUGHTER.  INDEPENDENT ADLS.  DOES USE CANE TO AMBULATE.  [place], EXT [id number].  DID LEAVE MESSAGE ASKING HIM TO PLEASE CALL AND VERIFY HER SERVICES.   HAS INSURANCE.  CURRENTLY IN CONTACT PRECAUTIONS PENDING STOOL CX RESULTS.  ANTICIPATE DISCHARGE TO HOME  WITH PREVIOUS  SERVICES.  Electronically signed by Shelba Flake, RN on 07/16/17 at 2:12 PM

## 2017-07-16 NOTE — ED Provider Notes (Signed)
I received patient signout from night shift provider.  Ongoing diarrhea and weakness.    I evaluated patient she still quite well-appearing and continuing to have diarrhea.  Patient cannot ambulate on her own.  I do not feel patient is safe to go home at this point.  I have admitted for further management.      Dolly Rias, MD  07/16/17 2013

## 2017-07-16 NOTE — ED Notes (Signed)
Bed: 23  Expected date:   Expected time:   Means of arrival:   Comments:  Squad: 67 y.o abd pain, hypotensive     Milana Kidney, RN  07/16/17 [phone]

## 2017-07-16 NOTE — ED Notes (Signed)
Repeat lactate drawn and taken to CCL.  Pt states she is too weak to drink at this time.     Ronal Fear, RN  07/16/17 1141

## 2017-07-16 NOTE — Progress Notes (Signed)
Dr Oneita Jolly alpha paged 3x since pt has been on unit for admit orders. Awaiting orders.

## 2017-07-16 NOTE — ED Notes (Signed)
Report to Chrisandra Netters, RN for lunch coverage     Ronal Fear, RN  07/16/17 1158

## 2017-07-16 NOTE — ED Notes (Signed)
EMS IV removed because not running well.   New IV placed for repeat blood draw.    Pt soiled herself.  Offered opportunity to clean up but patient refuses stating she is to sore, too tired and too weak.  Offered patient assistance but she still refused.     Lajean Manes, RN  07/16/17 [phone]

## 2017-07-16 NOTE — H&P (Signed)
History & Physical        Patient:Dawn Padilla  Date of Birth: 04-09-1950    MRN: [id number]     Acct: [id number]    PCP: America Brown, DO    Date of Admission: 07/16/2017    Date of Service: Pt seen/examined on 07/16/17     Chief Complaint:  N/V/D      History Of Present Illness: 67 y.o. female who presents with complaint/s of above developing rapidly and violently.  She became severely dehydrated and presented to ED.  She was given IVF and stabilized but with profuse diarrhea and weakness she required hospitalization.  Sh eis feeling improved with diarrhea slowing.  GI biofire + for Norovirus.  Multiple sick contacts with similar symptoms in family.     Past Medical History:          Diagnosis Date   ??? Asthma    ??? Bleeding ulcer    ??? Chronic back pain    ??? Hiatal hernia    ??? Hyperlipidemia    ??? Hypertension    ??? Neuropathy    ??? Osteoarthritis        Past Surgical History:          Procedure Laterality Date   ??? APPENDECTOMY     ??? BRAIN SURGERY  2003   ??? BREAST BIOPSY Left 1974    benign   ??? BREAST BIOPSY Left 08/17/13    malignant   ??? BREAST SURGERY Left 10/13/13    mastectomy   ??? CAROTID ENDARTERECTOMY  2006   ??? CHOLECYSTECTOMY     ??? COLONOSCOPY     ??? HYSTERECTOMY  1977   ??? LUNG SURGERY Right 1976   ??? TONSILLECTOMY     ??? UPPER GASTROINTESTINAL ENDOSCOPY         Medications Prior to Admission:      Prior to Admission medications    Medication Sig Start Date End Date Taking? Authorizing Provider   promethazine (PHENERGAN) 25 MG tablet Take 1 tablet by mouth every 6 hours as needed for Nausea 07/17/17 07/22/17 Yes Hilarie Fredrickson, DO   dicyclomine (BENTYL) 20 MG tablet Take 20 mg by mouth 4 times daily as needed   Yes Historical Provider, MD   PROAIR HFA 108 (90 Base) MCG/ACT inhaler INHALE TWO PUFFS EVERY 4 HOURS AS NEEDED 06/11/17  Yes Historical Provider, MD   alendronate (FOSAMAX) 70 MG tablet TAKE ONE TABLET BY MOUTH ONCE A WEEK ON MONDAY 06/06/17  Yes Historical Provider, MD   tamoxifen (NOLVADEX) 20 MG  tablet Take 1 tablet by mouth daily 06/05/17  Yes Sameer A Mahesh, MD   Calcium Carb-Cholecalciferol (CALCIUM + D3) 600-200 MG-UNIT TABS Take 1 tablet by mouth daily  06/14/14  Yes Historical Provider, MD   omeprazole (PRILOSEC) 40 MG capsule Take 40 mg by mouth daily  06/14/14  Yes Historical Provider, MD   cyclobenzaprine (FLEXERIL) 10 MG tablet Take 10 mg by mouth every evening    Yes Historical Provider, MD   benztropine (COGENTIN) 0.5 MG tablet Take 0.5 mg by mouth nightly    Yes Historical Provider, MD   hydrALAZINE (APRESOLINE) 25 MG tablet Take 25 mg by mouth every 12 hours    Yes Historical Provider, MD   gabapentin (NEURONTIN) 100 MG capsule Take 100 mg by mouth nightly as needed.    Yes Historical Provider, MD   metoprolol (LOPRESSOR) 100 MG tablet Take 100 mg by mouth daily    Yes Historical  Provider, MD   amLODIPine (NORVASC) 5 MG tablet Take 5 mg by mouth daily   Yes Historical Provider, MD   atorvastatin (LIPITOR) 20 MG tablet Take 20 mg by mouth daily   Yes Historical Provider, MD   carbidopa-levodopa (SINEMET) 25-100 MG per tablet Take 1 tablet by mouth nightly    Yes Historical Provider, MD   lisinopril (PRINIVIL;ZESTRIL) 40 MG tablet Take 40 mg by mouth daily   Yes Historical Provider, MD   Breast Prosthesis MISC by Does not apply route 01/08/17   Lennon Alstrom, MD   Surgical Bra MISC by Does not apply route 01/08/17   Lennon Alstrom, MD        Current Hospital Medications:    No current facility-administered medications for this encounter.     Current Outpatient Medications:   ???  promethazine (PHENERGAN) 25 MG tablet, Take 1 tablet by mouth every 6 hours as needed for Nausea, Disp: 15 tablet, Rfl: 0  ???  dicyclomine (BENTYL) 20 MG tablet, Take 20 mg by mouth 4 times daily as needed, Disp: , Rfl:   ???  PROAIR HFA 108 (90 Base) MCG/ACT inhaler, INHALE TWO PUFFS EVERY 4 HOURS AS NEEDED, Disp: , Rfl: 3  ???  alendronate (FOSAMAX) 70 MG tablet, TAKE ONE TABLET BY MOUTH ONCE A WEEK ON MONDAY, Disp: ,  Rfl: 11  ???  tamoxifen (NOLVADEX) 20 MG tablet, Take 1 tablet by mouth daily, Disp: 30 tablet, Rfl: 5  ???  Calcium Carb-Cholecalciferol (CALCIUM + D3) 600-200 MG-UNIT TABS, Take 1 tablet by mouth daily , Disp: , Rfl:   ???  omeprazole (PRILOSEC) 40 MG capsule, Take 40 mg by mouth daily , Disp: , Rfl:   ???  cyclobenzaprine (FLEXERIL) 10 MG tablet, Take 10 mg by mouth every evening , Disp: , Rfl:   ???  benztropine (COGENTIN) 0.5 MG tablet, Take 0.5 mg by mouth nightly , Disp: , Rfl:   ???  hydrALAZINE (APRESOLINE) 25 MG tablet, Take 25 mg by mouth every 12 hours , Disp: , Rfl:   ???  gabapentin (NEURONTIN) 100 MG capsule, Take 100 mg by mouth nightly as needed. , Disp: , Rfl:   ???  metoprolol (LOPRESSOR) 100 MG tablet, Take 100 mg by mouth daily , Disp: , Rfl:   ???  amLODIPine (NORVASC) 5 MG tablet, Take 5 mg by mouth daily, Disp: , Rfl:   ???  atorvastatin (LIPITOR) 20 MG tablet, Take 20 mg by mouth daily, Disp: , Rfl:   ???  carbidopa-levodopa (SINEMET) 25-100 MG per tablet, Take 1 tablet by mouth nightly , Disp: , Rfl:   ???  lisinopril (PRINIVIL;ZESTRIL) 40 MG tablet, Take 40 mg by mouth daily, Disp: , Rfl:   ???  Breast Prosthesis MISC, by Does not apply route, Disp: 1 each, Rfl: 0  ???  Surgical Bra MISC, by Does not apply route, Disp: 12 each, Rfl: 0     Allergies:  Codeine; Demerol hcl [meperidine]; Morphine; Nsaids; Percocet [oxycodone-acetaminophen]; and Vicodin [hydrocodone-acetaminophen]    Social History:      The patient currently lives @home   Occupation: None    TOBACCO:   reports that she has never smoked. She has never used smokeless tobacco.  ETOH:   reports that she does not drink alcohol.  RECREATIONAL DRUG USE:   Social History     Substance and Sexual Activity   Drug Use Not on file       Family History:  Problem Relation Age of Onset   ??? Cancer Mother         lung   ??? Cancer Father         stomach   ??? Cancer Sister         breast   ??? Cancer Maternal Grandmother         colon         REVIEW OF SYSTEMS:     Review of Systems   Constitutional: Positive for appetite change, chills, diaphoresis, fatigue and fever.   HENT: Negative for congestion, rhinorrhea, sore throat and trouble swallowing.    Eyes: Negative.    Respiratory: Negative for cough, chest tightness and shortness of breath.    Cardiovascular: Negative for chest pain, palpitations and leg swelling.   Gastrointestinal: Positive for abdominal distention, diarrhea, nausea and vomiting. Negative for abdominal pain and blood in stool.   Endocrine: Negative.    Genitourinary: Negative for dysuria, frequency and hematuria.   Musculoskeletal: Negative for arthralgias and myalgias.   Skin: Negative for rash.   Allergic/Immunologic: Negative for immunocompromised state.   Neurological: Negative for dizziness, speech difficulty, weakness, light-headedness and numbness.   Hematological: Negative.    Psychiatric/Behavioral: Negative.         PHYSICAL EXAM:    BP 114/69    Pulse 72    Temp 98.3 ??F (36.8 ??C) (Temporal)    Resp 16    Ht 5\' 4"  (1.626 m)    Wt 93 lb (42.2 kg)    SpO2 98%    BMI 15.96 kg/m??     Physical Exam   Constitutional: She is oriented to person, place, and time. She appears well-nourished. No distress.   HENT:   Head: Normocephalic and atraumatic.   Eyes: Pupils are equal, round, and reactive to light.   Neck: Neck supple. No JVD present.   Cardiovascular: Normal rate and regular rhythm.   No murmur heard.  Pulmonary/Chest: Effort normal and breath sounds normal.   Abdominal: Soft. She exhibits no distension. There is tenderness. There is no rebound and no guarding.   Bowel sounds increased   Musculoskeletal: She exhibits no edema or tenderness.   Lymphadenopathy:     She has no cervical adenopathy.   Neurological: She is alert and oriented to person, place, and time.   Skin: Skin is warm and dry. No rash noted. No erythema.   Psychiatric: She has a normal mood and affect.       Labs:     Recent Labs     07/16/17  0342 07/17/17  0122   WBC 14.4* 8.9    HGB 13.6 12.6   HCT 40.6 36.9   PLT 215 198     Recent Labs     07/16/17  0342 07/17/17  0122   NA 142 141   K 3.4* 4.5   CL 108* 115*   CO2 24 23   BUN 15 7   CREATININE 0.81 0.77   CALCIUM 9.2 7.5*     Recent Labs     07/16/17  0342 07/17/17  0122   AST 34 34   ALT 34 29   BILIDIR 0.0  --    BILITOT 0.5 0.4   ALKPHOS 55 38     No results for input(s): INR in the last 72 hours.  No results for input(s): CKTOTAL, TROPONINI in the last 72 hours.  Recent Labs     07/16/17  0342 07/17/17  0122  ALKPHOS 55 38   ALT 34 29   AST 34 34   BILITOT 0.5 0.4   BILIDIR 0.0  --    LABALBU 4.3 3.2*   LIPASE 48  --    No results found for: TSHHS   No results for input(s): PH, PCO2, PO2, HCO3, O2SAT in the last 72 hours.            Urinalysis:      Lab Results   Component Value Date    NITRU NEG 07/16/2017   LABRCNT(AMPMETHURSCR,BARBTQTU,BDZQTU,CANNABQUANT,COCMETQTU,OPIAU,PCPQUANT)@CUA ,BACTERIA,RBCUA,,BLOODU,SPECGRAV,GLUCOSEU,KETONESU)@      Radiology:         CT Abdomen Pelvis W Contrast   Final Result        Code Status: Full Code    ASSESSMENT:  Active Hospital Problems    Diagnosis Date Noted   ??? Norovirus [A08.11] 07/17/2017   ??? Severe malnutrition ([place]) [E43] 07/17/2017   ??? Hypertension [I10] 07/16/2017   ??? Asthma [J45.909] 07/16/2017       PLAN:  IVF, antiemetic  Advance diet as tolerated    Anticipate DC later today to home, expectations for recovery discussed.      Electronically signed by Hilarie Fredrickson, DO

## 2017-07-16 NOTE — ED Notes (Signed)
Pt incontinent of stool and urine., pt skin cleansed, bed linens changed and placed in clean hospital gown.  Pt provided warm blankets, call light in reach and primary RN Abigail Butts notified.     Stanford Breed, RN  07/16/17 [phone]

## 2017-07-16 NOTE — ED Provider Notes (Signed)
[facility] EMERGENCY DEPT  eMERGENCY dEPARTMENT eNCOUnter      Pt Name: Dawn Padilla  MRN: [id number]  Birthdate 1951-02-22  Date of evaluation: 07/16/2017  Provider: Ronaldo Miyamoto, MD     CHIEF COMPLAINT       Chief Complaint   Patient presents with   ??? Abdominal Cramping     abd pain with n/v/d x 2 hours   ??? Leg Pain     leg cramping         HISTORY OF PRESENT ILLNESS   (Location/Symptom, Timing/Onset,Context/Setting, Quality, Duration, Modifying Factors, Severity) Note limiting factors.   HPI    Dawn Padilla is a 67 y.o. female who presents to the emergency department with abdominal pain.  Patient reports that 2 hours prior to arrival she started having diffuse abdominal pain that is cramping in nature.  This is been associated with nausea, vomiting, diarrhea.  During the interview, patient is intermittently yelling.  She states that she ate some ice cream and brownies with just prior to the abdominal pain starting.  Was also concerned that the brownies had marijuana since someone brought from a party.      Nursing Notes were reviewed.    REVIEW OFSYSTEMS    (2+ for level 4; 10+ for level 5)   Review of Systems  Constitutional: no fevers  Eyes: no discharge  ENT: no dental pain  Cardiovascular: no chest pain  Respiratory: no shortness of breath  Gastrointestinal: (+) nausea, (+) vomiting  Musculoskeletal: no joint pain  Skin: no rash  Neurologic: no headache  Genitourinary: no dysuria    PAST MEDICAL HISTORY     Past Medical History:   Diagnosis Date   ??? Asthma    ??? Bleeding ulcer    ??? Chronic back pain    ??? Hiatal hernia    ??? Hyperlipidemia    ??? Hypertension    ??? Neuropathy    ??? Osteoarthritis        SURGICAL HISTORY     Past Surgical History:   Procedure Laterality Date   ??? APPENDECTOMY     ??? BRAIN SURGERY  2003   ??? BREAST BIOPSY Left 1974    benign   ??? BREAST BIOPSY Left 08/17/13    malignant   ??? BREAST SURGERY Left 10/13/13    mastectomy   ??? CAROTID ENDARTERECTOMY  2006   ??? CHOLECYSTECTOMY     ??? COLONOSCOPY     ???  HYSTERECTOMY  1977   ??? LUNG SURGERY Right 1976   ??? TONSILLECTOMY     ??? UPPER GASTROINTESTINAL ENDOSCOPY         CURRENTMEDICATIONS       Previous Medications    ALENDRONATE (FOSAMAX) 70 MG TABLET    TAKE ONE TABLET BY MOUTH ONCE A WEEK ON MONDAY    AMLODIPINE (NORVASC) 5 MG TABLET    Take 5 mg by mouth daily    ATORVASTATIN (LIPITOR) 20 MG TABLET    Take 20 mg by mouth daily    BENZTROPINE (COGENTIN) 0.5 MG TABLET    Take 0.5 mg by mouth 2 times daily    BREAST PROSTHESIS MISC    by Does not apply route    CALCIUM CARB-CHOLECALCIFEROL (CALCIUM + D3) 600-200 MG-UNIT TABS        CARBIDOPA-LEVODOPA (SINEMET) 25-100 MG PER TABLET    Take 1 tablet by mouth 3 times daily    CYCLOBENZAPRINE (FLEXERIL) 10 MG TABLET    Take 10 mg  by mouth 3 times daily as needed for Muscle spasms    GABAPENTIN (NEURONTIN) 100 MG CAPSULE    Take 100 mg by mouth 3 times daily    HYDRALAZINE (APRESOLINE) 25 MG TABLET    Take 25 mg by mouth 3 times daily    LISINOPRIL (PRINIVIL;ZESTRIL) 40 MG TABLET    Take 40 mg by mouth daily    MEGESTROL (MEGACE) 40 MG TABLET        METOPROLOL (LOPRESSOR) 100 MG TABLET    Take 100 mg by mouth 2 times daily    MOMETASONE (ELOCON) 0.1 % CREAM    APPLY TO AFFECTED AREA TWICE DAILY    OMEPRAZOLE (PRILOSEC) 40 MG CAPSULE        PROAIR HFA 108 (90 BASE) MCG/ACT INHALER    INHALE TWO PUFFS EVERY 4 HOURS AS NEEDED    SURGICAL BRA MISC    by Does not apply route    TAMOXIFEN (NOLVADEX) 20 MG TABLET    Take 1 tablet by mouth daily    TRAMADOL (ULTRAM) 50 MG TABLET    Take 50 mg by mouth every 6 hours as needed for Pain    VITAMIN D (ERGOCALCIFEROL) 50000 UNITS CAPS CAPSULE    Take 50,000 Units by mouth once a week       ALLERGIES     Codeine; Demerol hcl [meperidine]; Morphine; Nsaids; Percocet [oxycodone-acetaminophen]; and Vicodin [hydrocodone-acetaminophen]    FAMILY HISTORY       Family History   Problem Relation Age of Onset   ??? Cancer Mother         lung   ??? Cancer Father         stomach   ??? Cancer Sister          breast   ??? Cancer Maternal Grandmother         colon        SOCIAL HISTORY       Social History     Socioeconomic History   ??? Marital status: Married     Spouse name: Not on file   ??? Number of children: Not on file   ??? Years of education: Not on file   ??? Highest education level: Not on file   Occupational History   ??? Not on file   Social Needs   ??? Financial resource strain: Not on file   ??? Food insecurity:     Worry: Not on file     Inability: Not on file   ??? Transportation needs:     Medical: Not on file     Non-medical: Not on file   Tobacco Use   ??? Smoking status: Never Smoker   ??? Smokeless tobacco: Never Used   Substance and Sexual Activity   ??? Alcohol use: No   ??? Drug use: Not on file   ??? Sexual activity: Not on file   Lifestyle   ??? Physical activity:     Days per week: Not on file     Minutes per session: Not on file   ??? Stress: Not on file   Relationships   ??? Social connections:     Talks on phone: Not on file     Gets together: Not on file     Attends religious service: Not on file     Active member of club or organization: Not on file     Attends meetings of clubs or organizations: Not on file     Relationship  status: Not on file   ??? Intimate partner violence:     Fear of current or ex partner: Not on file     Emotionally abused: Not on file     Physically abused: Not on file     Forced sexual activity: Not on file   Other Topics Concern   ??? Not on file   Social History Narrative   ??? Not on file       SCREENINGS           PHYSICAL EXAM    (up to 7 for level 4, 8 or more for level 5)     ED Triage Vitals [07/16/17 0414]   BP Temp Temp Source Pulse Resp SpO2 Height Weight   101/79 97.8 ??F (36.6 ??C) Oral 84 16 100 % 5\' 4"  (1.626 m) 97 lb (44 kg)       Physical Exam  Vital Signs: Reviewed  Constitutional: Intermittently yelling  Head: normocephalic, atraumatic  Eyes: no scleral injection, no scleral icterus, no conjunctival erythema  ENT: oropharynx clear, MMM  Neck: supple, trachea midline  Cardiovascular:  RRR, no m/r/g  Pulmonary: no evidence of labored breathing, clear to auscultation bilaterally  Abdominal: soft, minimal tenderness with diffuse palpation, nondistended, no rebound, no guarding  Extremities: warm, well perfused, no edema  Neurological: AAO x 3, nonfocal  Skin: warm, dry, no rash  Psych: no suicidal ideation    DIAGNOSTIC RESULTS     EKG (Per Emergency Physician):       RADIOLOGY (Per Emergency Physician):        Interpretation per the Radiologist below, ifavailable at the time of this note:  Ct Abdomen Pelvis W Contrast    Result Date: 07/16/2017  Patient Name:  Dawn Padilla, Dawn Padilla MRN:  [id number] FIN:  [id number] ---CT--- Exam Date/Time        07/16/2017 05:56:26 EDT                              Exam                  CT Abdomen/Pelvis w/ IV Contrast (IV Onl             Ordering Physician    Nedra Hai, MD, Erdem Naas                                       Accession Number      [id number]                                        CPT4 Codes 28315 (CT Abdomen/Pelvis w/ IV Contrast (IV Onl),  [id number] (CT ISOVUE 370MG /ML&[id number]&ML&1) Reason For Exam diffuse abdominal pain, n/v/d Report CT scan of the abdomen and pelvis with contrast.  Reasons for examination: Abdominal pain, nausea.  CT scans of the abdomen and pelvis were performed following intravenous contrast administration, with images from the lung bases through the pubic symphysis.  The lung bases show mild hyperinflation.  There are no pericardial or pleural effusions.  The liver is normal in size, shape, and attenuation.  There are no focal liver masses.  There has been a prior cholecystectomy.  The intrahepatic and extrahepatic biliary tree are normal for the post-cholecystectomy state.  The pancreas and  spleen are negative.  The stomach, duodenum are unremarkable.  There is no evidence of bowel obstruction, and the small bowel, large bowel, and mesentery are unremarkable.  There is no free air.   The kidneys and adrenal glands are normal.  There are no  mass lesions nor evidence of obstruction.  No calculi are seen.   Scans through the pelvis demonstrate the sigmoid colon and rectum to be unremarkable.  The bladder is unremarkable. Uterus is not densely seen There are no masses nor adenopathy.  There is no free fluid. The remainder of the pelvic contents are unremarkable.  No significant degenerative changes are seen in the spine with probable benign sclerotic area L4.  IMPRESSION: No evidence of abdominal or pelvic mass lesion or other definite significant abnormality  --- very minimal nonspecific dilatation of the GI tract which is largely fluid-filled. Report Dictated on Workstation: [id number] ---** Final ---** Dictated: 07/16/2017 6:11 am Dictating Physician: Ladona Ridgel, MD, Chrissie Noa Signed Date and Time: 07/16/2017 6:15 am Signed by: Ladona Ridgel, MD, Chrissie Noa Transcribed Date and Time: 07/16/2017 6:11      ED BEDSIDE ULTRASOUND:   Performed by ED Physician - none    LABS:  Labs Reviewed   CBC WITH AUTO DIFFERENTIAL - Abnormal; Notable for the following components:       Result Value    WBC 14.4 (*)     MCV 98.5 (*)     RDW 15.0 (*)     Granulocytes % 88.3 (*)     Lymphocyte % 4.5 (*)     Absolute Neut # 12.7 (*)     Absolute Lymph # 0.7 (*)     All other components within normal limits    Narrative:     Test Performed by [facility], [address], [place], [place]  [address]   BASIC METABOLIC PANEL - Abnormal; Notable for the following components:    Potassium 3.4 (*)     Chloride 108 (*)     Glucose 126 (*)     All other components within normal limits    Narrative:     Test Performed by [facility], [address] [place], [place]  [address]   LACTIC ACID, PLASMA - Abnormal; Notable for the following components:    Lactic Acid 2.5 (*)     All other components within normal limits    Narrative:     Test Performed by [facility], [address] [place], [place]  [address]   HEPATIC FUNCTION PANEL    Narrative:     Test Performed by [facility], [address] [place], [place]  [address]   LIPASE    Narrative:     Test Performed by [facility], [address] [place], [place]  [address]   URINALYSIS   THC (MARIJUANA), URINE, CONFIRMATION   URINE DRUG SCREEN   POCT LACTIC ACID        All other labs were within normal range or not returned as of this dictation.    EMERGENCY DEPARTMENT COURSE and DIFFERENTIALDIAGNOSIS/MDM:   Vitals:    Vitals:    07/16/17 0414 07/16/17 0538 07/16/17 0638   BP: 101/79 137/88 133/65   Pulse: 84 78 71   Resp: 16 16 16    Temp: 97.8 ??F (36.6 ??C)     TempSrc: Oral     SpO2: 100% 100% 100%   Weight: 44 kg (97 lb)     Height: 5\' 4"  (1.626 m)  Medications   sodium chloride flush 0.9 % injection 3 mL (3 mLs Intravenous Not Given 07/16/17 0415)   0.9 % sodium chloride bolus (0 mLs Intravenous Stopped 07/16/17 0600)   promethazine (PHENERGAN) injection 12.5 mg (12.5 mg Intravenous Given 07/16/17 0347)   acetaminophen (TYLENOL) tablet 1,000 mg (1,000 mg Oral Given 07/16/17 0347)   LORazepam (ATIVAN) injection 1 mg (1 mg Intravenous Given 07/16/17 0530)       MDM.  Patient's lab work showed a mild leukocytosis.  Computed tomography scan was unremarkable.  On reassessment patient did report improvement in her symptoms.  Still pending urine at this time.  I believe this is more of a viral process versus food related.  Patient will be signed out to Dr. Pablo Lawrence.     REVAL:     05:00 Patient screaming stating that her legs are cramping.    CRITICAL CARE TIME       CONSULTS:  None    PROCEDURES:  Unless otherwise noted below, none     Procedures    FINAL IMPRESSION      1. Nausea, vomiting, and diarrhea    2. Generalized abdominal pain          DISPOSITION/PLAN    DISPOSITION        PATIENT REFERRED TO:  No follow-up provider specified.    DISCHARGE MEDICATIONS:  New Prescriptions    No medications on file          (Please note:  Portions of this note were completed with a voice recognition program.Efforts were made to edit the dictations but occasionally words  and phrases are mis-transcribed.)  Form v2016.J.[id number]    @@ (electronically signed)  Emergency Medicine Provider          Ronaldo Miyamoto, MD  07/16/17 [phone]

## 2017-07-16 NOTE — ED Notes (Signed)
Pt straight cathed per hospital protocol, urine specimen to lab     Ronal Fear, RN  07/16/17 [phone]

## 2017-07-16 NOTE — Progress Notes (Signed)
Pt arrived around 1340 to floor, 744A. Pt stated she has been on antibiotics and has had diarrhea for about one week. Moved to private room 731 and stool sent for C-Diff per protocol. Pt is weak at this time and c/o leg cramps. Assist x1 to [facility]. Bed check on for safety. Call light in reach. Pr oriented to room and surroundings.

## 2017-07-17 LAB — COMPREHENSIVE METABOLIC PANEL W/ REFLEX TO MG FOR LOW K
ALT: 29 U/L (ref 13–69)
AST: 34 U/L (ref 15–46)
Albumin,Serum: 3.2 g/dL — ABNORMAL LOW (ref 3.5–5.0)
Alkaline Phosphatase: 38 U/L (ref 38–126)
Anion Gap: 4 NA
BUN: 7 mg/dL (ref 7–20)
CO2: 23 mmol/L (ref 22–30)
Calcium: 7.5 mg/dL — ABNORMAL LOW (ref 8.4–10.4)
Chloride: 115 mmol/L — ABNORMAL HIGH (ref 98–107)
Creatinine: 0.77 mg/dL (ref 0.52–1.25)
Glucose: 86 mg/dL (ref 70–100)
Potassium: 4.5 mmol/L (ref 3.5–5.1)
Sodium: 141 mmol/L (ref 135–145)
Total Bilirubin: 0.4 mg/dL (ref 0.2–1.3)
Total Protein: 6.1 g/dL — ABNORMAL LOW (ref 6.3–8.2)
eGFR AA: >60 mL/min (ref >60)
eGFR NON-AA: >60 mL/min (ref >60)

## 2017-07-17 LAB — CBC WITH AUTO DIFFERENTIAL
Basophils %: 0.3 % (ref 0.0–2.0)
Basophils Absolute: 0 10*3/uL (ref 0.0–0.2)
Eosinophils Absolute: 0.3 10*3/uL (ref 0.0–0.5)
Eosinophils: 3.5 % (ref 1.0–6.0)
Granulocytes %: 80.2 % — ABNORMAL HIGH (ref 40.0–80.0)
Hematocrit: 36.9 % (ref 35.0–47.0)
Hemoglobin: 12.6 g/dL (ref 11.7–16.0)
Lymphocyte %: 10.4 % — ABNORMAL LOW (ref 20.0–40.0)
Lymphocytes Absolute: 0.9 10*3/uL — ABNORMAL LOW (ref 1.0–4.3)
MCH: 33.9 pg (ref 26.0–34.0)
MCHC: 34.2 % (ref 32.0–36.0)
MCV: 99.1 fL — ABNORMAL HIGH (ref 79.0–98.0)
MPV: 8 fL (ref 7.4–10.4)
Monocytes %: 5.6 % (ref 2.0–10.0)
Monocytes Absolute: 0.5 10*3/uL (ref 0.0–0.8)
Neutrophils Absolute: 7.2 10*3/uL — ABNORMAL HIGH (ref 1.8–7.0)
Platelets: 198 10*3/uL (ref 140–440)
RBC: 3.73 10*6/uL — ABNORMAL LOW (ref 3.80–5.20)
RDW: 15.2 % — ABNORMAL HIGH (ref 11.5–14.5)
WBC: 8.9 10*3/uL (ref 3.6–10.7)

## 2017-07-17 LAB — GASTROINTESTINAL PANEL, MOLECULAR: Gastrointestinal PCR Panel: POSITIVE — AB

## 2017-07-17 MED ORDER — DICYCLOMINE HCL 20 MG PO TABS
20 MG | Freq: Four times a day (QID) | ORAL | Status: DC | PRN
Start: 2017-07-17 — End: 2017-07-17

## 2017-07-17 MED ORDER — PROMETHAZINE HCL 25 MG PO TABS
25 MG | Freq: Four times a day (QID) | ORAL | Status: DC | PRN
Start: 2017-07-17 — End: 2017-07-17
  Administered 2017-07-17: 15:00:00 25 mg via ORAL

## 2017-07-17 MED ORDER — PROMETHAZINE HCL 25 MG PO TABS
25 MG | ORAL_TABLET | Freq: Four times a day (QID) | ORAL | 0 refills | Status: AC | PRN
Start: 2017-07-17 — End: 2017-07-22

## 2017-07-17 MED ORDER — GABAPENTIN 100 MG PO CAPS
100 MG | Freq: Every evening | ORAL | Status: DC
Start: 2017-07-17 — End: 2017-07-17

## 2017-07-17 MED FILL — DICYCLOMINE HCL 20 MG PO TABS: 20 mg | ORAL | Qty: 1

## 2017-07-17 MED FILL — CARBIDOPA-LEVODOPA 25-100 MG PO TABS: 25-100 mg | ORAL | Qty: 1

## 2017-07-17 MED FILL — BENZTROPINE MESYLATE 0.5 MG PO TABS: 0.5 mg | ORAL | Qty: 1

## 2017-07-17 MED FILL — METOPROLOL TARTRATE 25 MG PO TABS: 25 mg | ORAL | Qty: 2

## 2017-07-17 MED FILL — ONDANSETRON HCL 4 MG/2ML IJ SOLN: 4 MG/2ML | INTRAMUSCULAR | Qty: 2

## 2017-07-17 MED FILL — KCL IN DEXTROSE-NACL 20-5-0.9 MEQ/L-%-% IV SOLN: 20-5-0.9 MEQ/L-%-% | INTRAVENOUS | Qty: 1000

## 2017-07-17 MED FILL — PANTOPRAZOLE SODIUM 40 MG PO TBEC: 40 mg | ORAL | Qty: 1

## 2017-07-17 MED FILL — ENOXAPARIN SODIUM 40 MG/0.4ML SC SOLN: 40 MG/0.4ML | SUBCUTANEOUS | Qty: 0.4

## 2017-07-17 MED FILL — TRAMADOL HCL 50 MG PO TABS: 50 mg | ORAL | Qty: 1

## 2017-07-17 MED FILL — TAMOXIFEN CITRATE 10 MG PO TABS: 10 mg | ORAL | Qty: 2

## 2017-07-17 MED FILL — PROMETHAZINE HCL 25 MG PO TABS: 25 mg | ORAL | Qty: 1

## 2017-07-17 MED FILL — GABAPENTIN 100 MG PO CAPS: 100 mg | ORAL | Qty: 1

## 2017-07-17 NOTE — Other (Unsigned)
Patient Acct Nbr: [id number]   Primary AUTH/CERT:   [place] Name: [place] name: [facility] Dual Complete  Primary Insurance Group Number: [id number]  Primary Insurance Plan Type: Health  Primary Insurance Policy Number: [id number]    Secondary AUTH/CERT:   [place] Name: [employer] Plan name: [facility] MyCrMcaid Second  Secondary Insurance Group Number: [employer]  Secondary Insurance Plan Type: Barrister's clerk Number: [id number]

## 2017-07-17 NOTE — Progress Notes (Signed)
Nutrition Assessment    Type and Reason for Visit: Initial, Positive Nutrition Screen(poor appetite, weight loss, difficulty chewing/swallowing, diarrhea, poor PO intake)    Nutrition Recommendations:   PATIENT MEETS ASPEN/AND GUIDELINES FOR SEVERE MALNUTRITION IN THE CONTEXT OF SOCIAL/ENVIRONMENTAL/BEHAVIORAL CIRCUMSTANCES.   1. Recommend continue on diet as currently ordered, patient is ordered Full Liquids Diet, pt has not yet had anything on full liquids, encouraged her to try fulls for lunch, this morning pt had coffee, OJ and sprite.   2. Will continue to monitor PO intake for adequacy  3. MD ordered Ensure Enlive BID which is appropriate, pt has been using Ensure Original at home TID, she likes vanilla flavor, 59fl oz Ensure Enlive provides 350 calories, 20gm protein.   4. Provided patient Ensure Enlive Vanilla samples and Ensure coupons for use after d/c, pt very appreciative of same. Pt denies educational needs beyond supplements and may be d/c today pending tolerance of PO Diet per MD   5. Will continue to monitor weight changes, labs and overall nutrition status   6. RD will continue to follow up weekly     Nutrition Assessment: pt is resting in bed, just finished speaking with MD who states that pt can likely be d/c today if she tolerates lunch, pt admitted due to abdominal pain, N/V/D, pt is + Norovirus, - C Diff, pt states that her appetite is back a little this morning but she is taking it easy as diarrhea persists, this morning pt had coffee and OJ and sprite and she was going to try to eat her jello, encouraged patient to try to order off of the full liquids menu for lunch to assess tolerance, pt was agreeable to trying same, pt denies food allergies, she also denies chewing/swallowing issues, MD ordered Ensure Enlive BID this morning- pt has not yet received, pt uses Ensure Original at home and uses 3x/day, prefers vanilla flavor, discussed Ensure Enlive and pt is excited to try this supplement  as she is looking to gain weight, pt UBW 110# 10/2016 prior to her sister passing away, CBW 93# via bedscale, 15.5% weight loss over 8 months, pt lives with her granddaughter PTA and she receives [employer], provided ensure samples and coupons for use after d/c     Malnutrition Assessment:  ?? Malnutrition Status: Meets the criteria for severe malnutrition  ?? Context: Social or environmental circumstances  ?? Findings of the 6 clinical characteristics of malnutrition (Minimum of 2 out of 6 clinical characteristics is required to make the diagnosis of moderate or severe Protein Calorie Malnutrition based on AND/ASPEN Guidelines):  1. Energy Intake-Less than or equal to 50% of estimated energy requirement, Greater than or equal to 1 month    2. Weight Loss-(15.5% (17#) weight loss (110# 10/2016 to CBW 93# via bedscale currently)), (8 month time period )  3. Fat Loss-Severe subcutaneous fat loss, Orbital, Triceps, Fat overlying the ribs  4. Muscle Loss-Severe muscle mass loss, Temples (temporalis muscle), Clavicles (pectoralis and deltoids), Interosseous  5. Fluid Accumulation-No significant fluid accumulation,    6. Grip Strength-Not measured    Nutrition Risk Level: High    Nutrient Needs:  ?? Estimated Daily Total Kcal: 1266-1477kcals/day, monitor labs for re-feeding syndrome   ?? Estimated Daily Protein (g): 42-59gm pro/day  ?? Estimated Daily Total Fluid (ml/day): per MD recommendations    Nutrition Diagnosis:   ?? Problem: Severe malnutrition, In context of social or environmental circumstances  ?? Etiology: related to (poor appetite/intake, mourning the loss of  her sister )    ??? Signs and symptoms:  as evidenced by (</=50% energy intake compared to estimated energy needs for >/= 1 month, 15.5% (17#) weight loss over 8 month period, severe muscle mass depletion in temporal/clavicle/interosseous regions, severe subcutaneous fat wasting orbital/tricep/thoracic regions)    Objective Information:  ?? Nutrition-Focused Physical  Findings: Braden = 16, skin intact, active bowel sounds, +I/O, - C Diff, + Norovirus, BM 4/17 per pt, lives with granddaughter, + weight loss of 173 (15.5%) x8 month time period since losing her sister and 4# more recently since onset of N/V/D   ?? Wound Type: None  ?? Current Nutrition Therapies:  ?? Oral Diet Orders: Full Liquid   ?? Oral Diet intake: (coffee, OJ and sprite for breakfast )  ?? Oral Nutrition Supplement (ONS) Orders: Standard High Calorie Oral Supplement(just ordered this AM, pt has not yet received)  ?? ONS intake: (pt has not yet received ONS )     ?? Anthropometric Measures:  ?? Ht: 5\' 4"  (162.6 cm)   ?? Current Body Wt: 93 lb 0.6 oz (42.2 kg)(bedscale)  ?? Admission Body Wt: 97 lb (44 kg)(stated)  ?? Usual Body Wt: 110 lb (49.9 kg)(10/2016)  ?? per EPIC: 98# on 07/03/17 --> pt states that she lost her sister in 10/2016 and at that time she was 110# but she has been not eating as much due to poor appetite and mourning the loss of her sister (15.5% over 8 months)  ?? Ideal Body Wt: 120 lb 2.4 oz (54.5 kg), % Ideal Body 78%  ?? BMI Classification: BMI <18.5 Underweight    Nutrition Interventions:   Continue current diet, Continue current ONS  Continued Inpatient Monitoring, Education Completed    Nutrition Evaluation:   ?? Evaluation: Goals set   ?? Goals: PO intake will improve to >50% at meals/supplements    ?? Monitoring: Meal Intake, Supplement Intake, Nutrition Progression, Diet Tolerance, Skin Integrity, I&O, Weight, Pertinent Labs, Diarrhea, Monitor Bowel Function      Electronically signed by Consuello Bossier, RD, LD on 07/17/17 at 11:17 AM    Contact Number: pager [phone]

## 2017-07-17 NOTE — Discharge Summary (Signed)
Discharge Summary    Dawn Padilla  DOB:  04-30-1950  MRN:  [id number]    ADMIT DATE:  07/16/2017  DISCHARGE DATE:  07/17/2017    PRIMARY CARE PHYSICIAN:  America Brown, DO    VISIT STATUS: Observation    CODE STATUS:  Full Code    DISCHARGE DIAGNOSES:  Principal Problem (Resolved):    Enteritis  Active Problems:    Hypertension    Asthma    Norovirus  Resolved Problems:    Dehydration    Lactic acidosis      HOSPITAL COURSE:  Patient hospitalized for intractable N/V/D and weakness.  At baseline patient is severely malnourished and debilitated.  Stool testing revealed Norovirus.  She was given supportive care and improved. Tolerating diet and strength improving, she was discharged home in improved condition.    SIGNIFICANT DIAGNOSTIC STUDIES:  CT abd/pelvis, GI biofire panel     CONSULTANTS:  None    MEDICATION CHANGES:  None    RECOMMENDED NEXT STEPS:   Continue with increased fluids for next 3-4 days and follow up with Dr. Wyatt Mage.       DISCHARGE MEDICATIONS:       Jochebed, Bills   Home Medication Instructions DGU:[id number]    Printed on:07/17/17 1103   Medication Information                      alendronate (FOSAMAX) 70 MG tablet  TAKE ONE TABLET BY MOUTH ONCE A WEEK ON MONDAY             amLODIPine (NORVASC) 5 MG tablet  Take 5 mg by mouth daily             atorvastatin (LIPITOR) 20 MG tablet  Take 20 mg by mouth daily             benztropine (COGENTIN) 0.5 MG tablet  Take 0.5 mg by mouth nightly              Breast Prosthesis MISC  by Does not apply route             Calcium Carb-Cholecalciferol (CALCIUM + D3) 600-200 MG-UNIT TABS  Take 1 tablet by mouth daily              carbidopa-levodopa (SINEMET) 25-100 MG per tablet  Take 1 tablet by mouth nightly              cyclobenzaprine (FLEXERIL) 10 MG tablet  Take 10 mg by mouth every evening              dicyclomine (BENTYL) 20 MG tablet  Take 20 mg by mouth 4 times daily as needed             gabapentin (NEURONTIN) 100 MG capsule  Take 100 mg by mouth  nightly as needed.              hydrALAZINE (APRESOLINE) 25 MG tablet  Take 25 mg by mouth every 12 hours              lisinopril (PRINIVIL;ZESTRIL) 40 MG tablet  Take 40 mg by mouth daily             metoprolol (LOPRESSOR) 100 MG tablet  Take 100 mg by mouth daily              omeprazole (PRILOSEC) 40 MG capsule  Take 40 mg by mouth daily  PROAIR HFA 108 (90 Base) MCG/ACT inhaler  INHALE TWO PUFFS EVERY 4 HOURS AS NEEDED             promethazine (PHENERGAN) 25 MG tablet  Take 1 tablet by mouth every 6 hours as needed for Nausea             Surgical Bra MISC  by Does not apply route             tamoxifen (NOLVADEX) 20 MG tablet  Take 1 tablet by mouth daily                 DIET: DIET FULL LIQUID;  Dietary Nutrition Supplements: Standard High Calorie Oral Supplement    ACTIVITY: No restriction.    PENDING STUDIES:    No    DISPOSITION: Home    Follow up with America Brown, DO  [place] [address]  [phone]    In 1 week       SIGNED:  Hilarie Fredrickson, DO   07/17/2017, 11:03 AM

## 2017-07-17 NOTE — Care Coordination-Inpatient (Signed)
PT NOW DISCHARGED TO HOME.  DENIES ANY DISCHARGE QUESTIONS OR NEEDS AT THIS TIME.  I DID CALL PASSPORT TO LET THEM KNOW OF DISCHARGE.  Electronically signed by Robyn Haber, RN on 07/17/2017 at 12:52 PM

## 2017-07-17 NOTE — Care Coordination-Inpatient (Signed)
SPOKE WITH PT THIS MORNING, + NOROVIRUS.  DOES NOT REMEMBER TALKING WITH ME YESTERDAY.  REINTRODUCED SELF.  PT STATES FEELING MUCH BETTER TODAY BUT STILL HAVING DIARRHEA.  CONTINUES WITH IV FLUIDS AT 125/HR, PRN ANTIEMETICS, FULL LIQUID DIET.  DID DISCUSS HER PASSPORT SERVICES.  SHE STATED THAT [facility] PROVIDES HER HHA 3X WEEK FOR 2 HRS PER DAY.  I DID RECEIVE PHONE CALL FROM [facility], VERIFIED THIS.  PT ALSO STATES HAS ALERT BUTTON AND MOM'S MEALS DELIVERED.  ANTICIPATE DISCHARGE TO HOME WITH PASSPORT SERVICES. Electronically signed by Shelba Flake, RN on 07/17/17 at 10:49 AM

## 2017-07-17 NOTE — Discharge Instructions (Signed)
As tolerated

## 2017-07-17 NOTE — Progress Notes (Signed)
Iv discontinued, homegoing instructions and medlist reviewed with patient. meds to bed delivered to bedside. Transport at bedside now

## 2017-07-17 NOTE — Plan of Care (Signed)
Norovirus detected - Please Maintain Enhanced Contact Precautions; Post & follow instructions on Yellow Sign, including bleach-based environmental disinfection & terminal room clean with Hydrogen peroxide

## 2017-08-08 LAB — C. DIFFICILE TOXIN MOLECULAR: CLOSTRIDIUM DIFFICILE: NEGATIVE

## 2017-08-08 LAB — GASTROINTESTINAL PANEL, MOLECULAR: Gastrointestinal PCR Panel: NEGATIVE

## 2017-08-08 NOTE — Other (Unsigned)
Patient Acct Nbr: [id number]   Primary AUTH/CERT:   Primary Insurance Company Name: [employer] Plan name: [facility]  Primary Insurance Group Number: [facility]  Primary Insurance Plan Type: Health  Primary Insurance Policy Number: [id number]    Secondary AUTH/CERT:   Secondary Insurance Company Name: [employer] Plan name: [facility] [place] [facility] [employer] Second  Secondary Insurance Group Number: [employer]  Secondary Insurance Plan Type: Engineer, structural Number: [id number]

## 2017-08-13 LAB — OVA AND PARASITE EXAMINATION: Ova And Parasite Wet Mount: NEGATIVE NA

## 2017-08-19 ENCOUNTER — Encounter: Primary: Family Medicine

## 2017-08-30 ENCOUNTER — Ambulatory Visit: Primary: Family Medicine

## 2017-10-01 ENCOUNTER — Inpatient Hospital Stay: Admit: 2017-10-01 | Attending: Family Medicine | Primary: Family Medicine

## 2017-10-01 NOTE — Other (Unsigned)
Patient Acct Nbr: [id number]   Primary AUTH/CERT:   [place] Name: [employer] Plan name: [facility] Dual Complete  Primary Insurance Group Number: [employer]  Primary Insurance Plan Type: Health  Primary Insurance Policy Number: [id number]

## 2017-10-16 LAB — C. DIFFICILE TOXIN MOLECULAR: CLOSTRIDIUM DIFFICILE: NEGATIVE

## 2017-10-16 LAB — GASTROINTESTINAL PANEL, MOLECULAR: Gastrointestinal PCR Panel: NEGATIVE

## 2017-10-16 NOTE — Other (Unsigned)
Patient Acct Nbr: [id number]   Primary AUTH/CERT:   [place] Name: Universal Health Plan name: [facility]  Primary Insurance Group Number: [facility]  Primary Insurance Plan Type: Health  Primary Insurance Policy Number: [id number]    Secondary AUTH/CERT:   [place] Name: [place] name: [facility] [employer]CrMcaid Second  Secondary Insurance Group Number: [employer]  Secondary Insurance Plan Type: [employer]ister[employer] Number: [id number]

## 2017-10-21 LAB — OVA AND PARASITE EXAMINATION
Ova And Parasite Wet Mount: NEGATIVE NA
Ova and Parasite Trichrome: NEGATIVE NA

## 2017-11-11 MED ORDER — TAMOXIFEN CITRATE 20 MG PO TABS
20 | ORAL_TABLET | Freq: Every day | ORAL | 11 refills | Status: DC
Start: 2017-11-11 — End: 2018-10-06

## 2018-02-19 LAB — COMPREHENSIVE METABOLIC PANEL
ALT: 26 U/L (ref 13–69)
AST: 35 U/L (ref 15–46)
Albumin,Serum: 4.2 g/dL (ref 3.5–5.0)
Alkaline Phosphatase: 53 U/L (ref 38–126)
Anion Gap: 9 NA
BUN: 19 mg/dL (ref 7–20)
CO2: 26 mmol/L (ref 22–30)
Calcium: 9.1 mg/dL (ref 8.4–10.4)
Chloride: 102 mmol/L (ref 98–107)
Creatinine: 0.92 mg/dL (ref 0.52–1.25)
EGFR IF NonAfrican American: >60 mL/min (ref >60)
Glucose: 77 mg/dL (ref 70–100)
Potassium: 4.1 mmol/L (ref 3.5–5.1)
Sodium: 137 mmol/L (ref 135–145)
Total Bilirubin: 0.7 mg/dL (ref 0.2–1.3)
Total Protein: 7.8 g/dL (ref 6.3–8.2)
eGFR African American: >60 mL/min (ref >60)

## 2018-02-19 LAB — CBC WITH AUTO DIFFERENTIAL
Absolute Baso #: 0 10*3/uL (ref 0.0–0.2)
Absolute Eos #: 0.4 10*3/uL (ref 0.0–0.5)
Absolute Lymph #: 2.2 10*3/uL (ref 1.0–4.3)
Absolute Mono #: 0.6 10*3/uL (ref 0.0–0.8)
Absolute Neut #: 3.1 10*3/uL (ref 1.8–7.0)
Basophils: 0.6 % (ref 0.0–2.0)
Eosinophils: 5.9 % (ref 1.0–6.0)
Granulocytes %: 49.6 % (ref 40.0–80.0)
Hematocrit: 38.8 % (ref 35.0–47.0)
Hemoglobin: 13.2 g/dL (ref 11.7–16.0)
Lymphocyte %: 34.8 % (ref 20.0–40.0)
MCH: 33.2 pg (ref 26.0–34.0)
MCHC: 33.9 % (ref 32.0–36.0)
MCV: 98 fL (ref 79.0–98.0)
MPV: 8.6 fL (ref 7.4–10.4)
Monocytes: 9.1 % (ref 2.0–10.0)
Platelets: 218 10*3/uL (ref 140–440)
RBC: 3.96 10*6/uL (ref 3.80–5.20)
RDW: 14.2 % (ref 11.5–14.5)
WBC: 6.3 10*3/uL (ref 3.6–10.7)

## 2018-02-19 LAB — LIPID PANEL
Chol/HDL Ratio: 2 NA
Cholesterol: 130 mg/dL (ref <200)
HDL: 56 mg/dL (ref 40–60)
LDL Cholesterol: 62 mg/dL (ref <100)
Triglycerides: 60 mg/dL (ref <150)

## 2018-02-19 LAB — TSH: TSH: 1.305 u[IU]/mL (ref 0.465–4.680)

## 2018-02-19 NOTE — Other (Unsigned)
Patient Acct Nbr: [id number]   Primary AUTH/CERT:   Primary Insurance Company Name: [employer] Plan name: [facility] Dual Complete  Primary Insurance Group Number: [employer]  Primary Insurance Plan Type: Health  Primary Insurance Policy Number: [id number]

## 2018-06-12 NOTE — Telephone Encounter (Signed)
Recall mailed to pt to schedule appointment with Dr Teodoro Kil for yearly breast exam due April 2020

## 2018-07-11 ENCOUNTER — Telehealth
Admit: 2018-07-11 | Discharge: 2018-07-11 | Payer: PRIVATE HEALTH INSURANCE | Attending: Hematology & Oncology | Primary: Family Medicine

## 2018-07-11 DIAGNOSIS — Z170 Estrogen receptor positive status [ER+]: Secondary | ICD-10-CM

## 2018-07-11 DIAGNOSIS — C50112 Malignant neoplasm of central portion of left female breast: Secondary | ICD-10-CM

## 2018-07-11 NOTE — Progress Notes (Signed)
HPI  Dawn Padilla is a 68 y.o.  female with whom I am having a televisit. She consents to televisit and declines video option due to lack of smartphone and computer.     she feels well.  Apettite  and energy levels are good.  No hot flashes.  Hair thinning has resolved.    she has the following oncology history  1.  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  2.  Currently on tamoxifen since 11/16/2013.      Review of Systems   Constitutional: Negative for fatigue, fever and unexpected weight change.   HENT: Negative for mouth sores and trouble swallowing.    Eyes: Negative for photophobia and visual disturbance.   Respiratory: Negative for cough and shortness of breath.    Cardiovascular: Negative for chest pain.   Gastrointestinal: Negative for abdominal pain, diarrhea, nausea and vomiting.   Genitourinary: Negative for dysuria and hematuria.   Musculoskeletal: Negative for arthralgias and myalgias.   Skin: Negative for rash.   Neurological: Negative for seizures, light-headedness and headaches.   Hematological: Negative for adenopathy. Does not bruise/bleed easily.   Psychiatric/Behavioral: Negative for sleep disturbance.         Family History   Problem Relation Age of Onset   . Cancer Mother         lung   . Cancer Father         stomach   . Cancer Sister         breast   . Cancer Maternal Grandmother         colon     Social History     Tobacco Use   . Smoking status: Never Smoker   . Smokeless tobacco: Never Used   Substance Use Topics   . Alcohol use: No       There were no vitals filed for this visit.      Lab Results   Component Value Date    WBC 6.3 02/19/2018    HGB 13.2 02/19/2018    HCT 38.8 02/19/2018    MCV 98.0 02/19/2018    PLT 218 02/19/2018   Last mammogram July 2019 is negative.    Assessment/Plan:  Diagnoses and all orders for this visit:    Malignant neoplasm of central portion of left breast in female, estrogen receptor positive  ([place])    1. she appears to be in complete remission. Will do only symptom guided imaging.    2. 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 daily.  Treatment intent is curative.  Timing is adjuvant.  Planned duration is 10 years.  Response Rate: NA since adjuvant  I have discussed some of the side efffects that include but not limited to arthralgias, myalgias, vaginal dryness, hair thinning, hot flashes, osteopenia.  Prognosis: Five-year disease-free survival 85%.  Absolute benefit of tamoxifen 4%.  NCCN guidelines were used.  She consents to treatment. Shared decision making was performed.  Compliance assessed. Patient is taking medication at appropriate dose and schedule.    3. [name] on diet and exercise.  Patient verbalizes understanding and agrees with the plan.    4. Next Mammogram due july 2020.  Follow-up in one year for office visit.    Patient was seen today via Telehealth by agreement and consent in light of the current COVID-19 pandemic. I used the following  Telehealth technology: Audio capability only. The patient was offered and advised video for a more comprehensive evaluation, but the patient declined or was unable to use video. This patient encounter is appropriate and reasonable under the circumstances given the patient's particular presentation at this time. The patient has been advised of the potential risks and limitations of this mode of treatment (including but not limited to the absence of in-person examination) and has agreed to be treated in a remote fashion in spite of them. Any and all of the patient's/patient's family's questions on this issue have been answered and I have made no promises or guarantees to the patient. The patient has also been advised to contact this office for worsening conditions or problems, and seek emergency medical treatment and/or call 911 if the patient deems either necessary.    Time spent  15 mins. >50% time spent discussing plan, counseling patient, shared decision making and coordinating care.

## 2018-10-06 ENCOUNTER — Inpatient Hospital Stay: Admit: 2018-10-06 | Attending: Family Medicine | Primary: Family Medicine

## 2018-10-06 MED ORDER — TAMOXIFEN CITRATE 20 MG PO TABS
20 MG | ORAL_TABLET | Freq: Every day | ORAL | 3 refills | Status: DC
Start: 2018-10-06 — End: 2019-07-28

## 2018-10-06 NOTE — Other (Unsigned)
Patient Acct Nbr: [id number]   Primary AUTH/CERT:   Primary Insurance Company Name: [employer] Plan name: [facility]  Primary Insurance Group Number: [facility]  Primary Insurance Plan Type: Health  Primary Insurance Policy Number: [id number]

## 2018-10-06 NOTE — Other (Unsigned)
Patient Acct Nbr: [id number]   Primary AUTH/CERT:   [place] Name: [place] name: [facility] Dual Complete  Primary Insurance Group Number: [employer]  Primary Insurance Plan Type: Health  Primary Insurance Policy Number: [id number]    Secondary AUTH/CERT:   [place] Name: [employer] Plan name: [facility] MyCrMcaid Second  Secondary Insurance Group Number: [employer]  Secondary Insurance Plan Type: Barrister's clerk Number: [id number]

## 2018-10-14 ENCOUNTER — Ambulatory Visit: Primary: Family Medicine

## 2018-10-14 ENCOUNTER — Inpatient Hospital Stay: Admit: 2018-10-14 | Attending: Family Medicine | Primary: Family Medicine

## 2018-10-14 ENCOUNTER — Ambulatory Visit
Admit: 2018-10-14 | Discharge: 2018-10-14 | Payer: PRIVATE HEALTH INSURANCE | Attending: Surgery | Primary: Family Medicine

## 2018-10-14 DIAGNOSIS — R920 Mammographic microcalcification found on diagnostic imaging of breast: Secondary | ICD-10-CM

## 2018-10-14 MED ORDER — LORAZEPAM 1 MG PO TABS
1 MG | ORAL_TABLET | Freq: Once | ORAL | 0 refills | Status: AC
Start: 2018-10-14 — End: 2018-10-14

## 2018-10-14 NOTE — Care Coordination-Inpatient (Signed)
Notified  Dr. Tera Helper, at 08:14, spoke to [facility]. I discussed the recommendation from our Radiologist Dr. Louanne Skye after imaging in the breast center today the Radiologist has recommended a referral for surgical consult/biopsy of the RIGHT  breast.   There was a discussion with the patient regarding the imaging findings, follow up, surgical consult and the type of biopsy recommended.  Biopsy teaching sheets were provided. Patient has had prior breast cancer left breast 2015.  Has been followed by Dr.VanFossen, requested follow up per Waunita Schooner Dr.VanFossen can see today at 14:00. Patient seated in wheelchair. Tremor noted arm. Frail appearance. Emotional discussing loss of her sister who was her mian support. Has home health aide. Discussed coming back later today, patient agrees.   The patient has been scheduled to follow with breast specialist Dr.VanFossen, today at [address].  Encouraged to have times for Amy Dr.VanFossen's MA later today that her home health aid can accompany her to coordinate scheduling biopsy     A Diagnostic Plan of Care was provided to the patient and faxed to the referring provider. All questions were answered. The contact for the nurse navigator was provided and the patient was encouraged to call with any additional questions or concerns.

## 2018-10-14 NOTE — Progress Notes (Signed)
Chief Complaint   Patient presents with   . Breast Problem     History of Present Illness:  Dawn Padilla is a 68 y.o. female shehas the following oncology history  1. 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  2. Currently on tamoxifen since 11/16/2013.  Treatments include:  Surgery-10/13/13 left mastectomy  Chemotherapy- Oncotype=10  Endocrine therapy-tamoxifen, tolerating well  Radiation therapy-none  Last mammogram date 10/06/18.BIRADS 4- calcifications RIGHT breast suspicious and stereotactic bx recommended  No new breast concerns. No wt loss or cough  Has had wt loss due to dental issues-and issues with her family.  + anxiety  + fam history of breast cancer: sister at 26  Dad had pancreatic, mother lung cancer    Imaging:  Exam Date/Time    10/06/2018 10:04:54 EDT                  Exam         MG Breast Tomosynthesis Scr Right             Ordering Physician  Dawn Padilla                      Accession Number   [id number]                           IEP3 IRJJO    [id number] (MG MAMMO 2D SCREENING), [phone] (MG Breast Tomosynthesis Scr Right)            Reason For Exam    screening            Report    TIME SINCE LAST MAMMOGRAM: Last mammogram was performed 1 year ago.        REASON FOR EXAM: screening, asymptomatic.            PROCEDURE: MG BREAST TOMOSYNTHESIS SCR RIGHT: October 06, 2018 - ACCESSION #:    [id number]    2D/3D Procedure    3D CC and MLO view(s) were taken of the right breast.    2D CC and MLO view(s) were taken of the right breast.        Prior study comparison: October 01, 2017, right breast MG breast tomosynthesis    scr right performed at [facility] Rad[facility]. Aug 16, 2016, right breast MG breast    tomosynthesis scr right performed at     [facility] Radiology. Aug 06, 2013, left breast MG mammogram    digital diagnostic left  performed     at [facility]. July 24, 2013, bilateral MG mammogram    digital screening performed at    [facility].        TISSUE DENSITY: BIRADS C - The breast tissue is heterogeneously dense,    which could obscure underlying     abnormalities. Marland Kitchen        FINDINGS:    Calcifications are noted in the posterior third of the superior lateral    aspect of the right breast.     Additional imaging is recommended.        No discrete masses are identified. There is no skin thickening or nipple    retraction.        Markings on images:    BB's = Nipples; skin lesions    Open circle = Palpable  Line = Scar        2D digital mammography and tomosynthesis imaging were performed and    reviewed with CAD.        ASSESSMENT: Category 0 Incomplete: need additional imaging evaluation    RECOMMENDATION: Follow-up diagnostic mammogram of the right breast. With a    follow-up Ultrasound if     needed.      Exam Date/Time    10/14/2018 07:55:35 EDT                  Exam         MG Breast Tomosynthesis Uni                Ordering Physician  Dawn Padilla                      Accession Number   [id number]                           [place]    [id number] (MG Breast Tomosynthesis Right), [id number] (MG MAMMO 2D DIAGNOSTIC)            Reason For Exam    right calcs            Report    TIME SINCE LAST MAMMOGRAM: Last mammogram was performed less than 1 month    ago.        REASON FOR EXAM: addl eval requested from prior study.            PROCEDURE: MG BREAST TOMOSYNTHESIS RIGHT: October 14, 2018 - ACCESSION #:    [id number]    2D/3D Procedure    3D views: ML view(s) were taken of the right breast.    2D views: Spot compression CC, spot compression ML, and ML view(s) were    taken of the right breast.        Prior study comparison: October 06, 2018, right breast MG breast  tomosynthesis    scr right performed at [facility] Radiology. October 01, 2017, right breast MG breast    tomosynthesis scr right performed at     [facility] Radiology. Aug 06, 2013, left breast MG mammogram    digital diagnostic left performed     at [facility]iology. July 24, 2013, bilateral MG mammogram    digital screening performed at    [facility] Radiology.        TISSUE DENSITY: BIRADS C - The breast tissue is heterogeneously dense,    which could obscure underlying     abnormalities. Marland Kitchen        FINDINGS:    Patient is recalled from a screening mammogram for indeterminate    microcalcifications.        In the upper outer quadrant of the right breast at a posterior depth there    is a small group of     microcalcifications. There morphology is slightly irregular and    heterogeneous. Considering the patient's    history of a left breast cancer, recommend stereotactic biopsy.                                   Impression:    Small group of microcalcifications demonstrate some suspicious features and    stereotactic guided biopsy     is recommended.  I discussed with the patient that due to the patient's    breast thickness this may not be     technically possible but should be attempted.        The findings and recommendation were discussed the patient the time of    interpretation. The patient is     known to Dr. Nonda Padilla. She will see her today for surgical consultation.    Dawn Padilla was notified in the     patient's primary care office at [place] on images:    BB's = Nipples; skin lesions    Open circle = Palpable    Line = Scar        2D digital mammography and tomosynthesis imaging were performed and    reviewed with CAD.        ASSESSMENT: Category 4 Suspicious    RECOMMENDATION: Surgical consultation.    Stereotactic core biopsy of the left breast.            Review of Systems:  Review of Systems   Constitutional: Positive for  unexpected weight change (wt loss due to dental issues and family issues per pt). Negative for activity change, diaphoresis, fatigue and fever.   HENT: Negative for sore throat, trouble swallowing and voice change.    Eyes: Negative for photophobia and visual disturbance.   Respiratory: Negative for cough, choking, shortness of breath, wheezing and stridor.    Cardiovascular: Negative for chest pain and palpitations.   Gastrointestinal: Negative for abdominal pain, constipation, nausea and vomiting.   Endocrine: Negative for cold intolerance, heat intolerance, polydipsia, polyphagia and polyuria.   Musculoskeletal: Positive for arthralgias. Negative for myalgias, neck pain and neck stiffness.   Skin: Negative for color change, pallor, rash and wound.   Allergic/Immunologic: Negative for immunocompromised state.   Neurological: Negative for tremors, weakness and numbness.   Hematological: Negative for adenopathy. Does not bruise/bleed easily.   Psychiatric/Behavioral: Negative for confusion and decreased concentration. The patient is nervous/anxious.        Past Medical History:   Diagnosis Date   . Asthma    . Bleeding ulcer    . Breast cancer ([place])     left breast   . Chronic back pain    . Hiatal hernia    . Hyperlipidemia    . Hypertension    . Neuropathy    . Osteoarthritis      Past Surgical History:   Procedure Laterality Date   . APPENDECTOMY     . BRAIN SURGERY  2003   . BREAST BIOPSY Left 1974    benign   . BREAST BIOPSY Left 08/17/13    malignant   . BREAST SURGERY Left 10/13/13    mastectomy   . CAROTID ENDARTERECTOMY  2006   . CHOLECYSTECTOMY     . COLONOSCOPY     . HYSTERECTOMY  1977   . LUNG SURGERY Right 1976   . TONSILLECTOMY     . UPPER GASTROINTESTINAL ENDOSCOPY       Allergies   Allergen Reactions   . Codeine    . Demerol Hcl [Meperidine]    . Morphine    . Nsaids Other (See Comments)     Bleeding ulcer   . Percocet [Oxycodone-Acetaminophen]    . Vicodin [Hydrocodone-Acetaminophen]      Ht 5' 4"   (1.626 m)   Wt 99 lb (44.9 kg)   BMI 16.99 kg/m   Current Outpatient Medications  on File Prior to Visit   Medication Sig Dispense Refill   . promethazine (PHENERGAN) 25 MG tablet TAKE ONE TABLET BY MOUTH EVERY 4 HOURS     . predniSONE (DELTASONE) 10 MG tablet TAKE ONE TABLET BY MOUTH EVERY DAY     . fluticasone (FLONASE) 50 MCG/ACT nasal spray INTILL ONE SPRAY IN EACH nostril ONCE A DAY Nasally     . NATURAL VITAMIN D-3 125 MCG (5000 UT) TABS tablet TAKE ONE TABLET BY MOUTH EVERY DAY     . tamoxifen (NOLVADEX) 20 MG tablet Take 1 tablet by mouth daily 90 tablet 3   . dicyclomine (BENTYL) 20 MG tablet Take 20 mg by mouth 4 times daily as needed     . PROAIR HFA 108 (90 Base) MCG/ACT inhaler INHALE TWO PUFFS EVERY 4 HOURS AS NEEDED  3   . alendronate (FOSAMAX) 70 MG tablet TAKE ONE TABLET BY MOUTH ONCE A WEEK ON MONDAY  11   . Calcium Carb-Cholecalciferol (CALCIUM + D3) 600-200 MG-UNIT TABS Take 1 tablet by mouth daily      . omeprazole (PRILOSEC) 40 MG capsule Take 40 mg by mouth daily      . cyclobenzaprine (FLEXERIL) 10 MG tablet Take 10 mg by mouth every evening      . benztropine (COGENTIN) 0.5 MG tablet Take 0.5 mg by mouth nightly      . hydrALAZINE (APRESOLINE) 25 MG tablet Take 25 mg by mouth every 12 hours      . gabapentin (NEURONTIN) 100 MG capsule Take 100 mg by mouth nightly as needed.      . metoprolol (LOPRESSOR) 100 MG tablet Take 100 mg by mouth daily      . amLODIPine (NORVASC) 5 MG tablet Take 5 mg by mouth daily     . atorvastatin (LIPITOR) 20 MG tablet Take 20 mg by mouth daily     . carbidopa-levodopa (SINEMET) 25-100 MG per tablet Take 1 tablet by mouth nightly      . lisinopril (PRINIVIL;ZESTRIL) 40 MG tablet Take 40 mg by mouth daily     . Breast Prosthesis MISC by Does not apply route (Patient not taking: Reported on 10/14/2018) 1 each 0   . Surgical Bra MISC by Does not apply route (Patient not taking: Reported on 10/14/2018) 12 each 0     No current facility-administered medications on  file prior to visit.          Physical Exam:  Physical Exam  Constitutional:       General: She is not in acute distress.     Appearance: She is well-developed. She is not diaphoretic.   HENT:      Head: Normocephalic and atraumatic.   Eyes:      General: No scleral icterus.     Conjunctiva/sclera: Conjunctivae normal.   Neck:      Musculoskeletal: Normal range of motion and neck supple.      Thyroid: No thyromegaly.      Vascular: No JVD.      Trachea: No tracheal deviation.   Cardiovascular:      Heart sounds: Normal heart sounds.   Pulmonary:      Effort: Pulmonary effort is normal. No respiratory distress.      Breath sounds: Normal breath sounds. No stridor.   Chest:      Chest wall: No mass, deformity, swelling or tenderness.      Breasts: Breasts are symmetrical.         Right:  No inverted nipple, mass, nipple discharge, skin change or tenderness.         Left: Absent. No mass, skin change or tenderness.       Musculoskeletal: Normal range of motion.         General: No tenderness.   Lymphadenopathy:      Cervical: No cervical adenopathy.      Right cervical: No superficial, deep or posterior cervical adenopathy.     Left cervical: No superficial, deep or posterior cervical adenopathy.      Upper Body:      Right upper body: No supraclavicular, axillary or pectoral adenopathy.      Left upper body: No supraclavicular, axillary or pectoral adenopathy.   Skin:     General: Skin is warm and dry.      Coloration: Skin is not pale.      Findings: No erythema or rash.   Neurological:      Mental Status: She is alert and oriented to person, place, and time.      Cranial Nerves: No cranial nerve deficit.   Psychiatric:         Behavior: Behavior normal.          Assessment:   1. Breast microcalcifications    2. Malignant neoplasm of central portion of left breast in female, estrogen receptor positive ([place])    3. Encounter for follow-up surveillance of breast cancer    Cancer Staging  Malignant neoplasm of left breast  [facility])  Staging form: Breast, AJCC 7th Edition  - Clinical stage from 06/30/2014: Stage IIA (T1c, N1, M0) - Signed by Lennon Alstrom, MD on 06/30/2014        Plan:    Orders Placed This Encounter   Procedures   . MAM Stereotactic Breast Biopsy   . Surgical Pathology         Rico Junker Fossen MD  10/14/18  Please disregard any typographical errors. This note was dictated using voice recognition software.

## 2018-10-14 NOTE — Patient Instructions (Addendum)
Right stereotactic biopsy scheduled 10/27/18 9:30am [facility] for [place] [address], ground floor  Arrive at [employer] for [employer] may eat and drink normal  Have a driver  Take Ativan AFTER registration        Patient Education        Stereotactic Breast Biopsy: About This Test  What is it?  A stereotactic breast biopsy removes a sample of breast tissue that is looked at under a microscope to check for breast cancer. It uses computerized X-rays to precisely locate where to get the tissue samples.  Why is this test done?  A breast biopsy is most often done to check a lump found during a breast exam or a suspicious area found on a mammogram or other imaging. A stereotactic test is most useful when a doctor can see a problem in the breast on a mammogram but can't locate it by touch.  How do you prepare for the test?  If you take aspirin or some other blood thinner, ask your doctor if you should stop taking it before your test. Make sure that you understand exactly what your doctor wants you to do. These medicines increase the risk of bleeding.  How is the test done?  ?? You may sit in a chair. Or you may lie on your stomach on a table that has a hole for your breast to hang through.  ?? When the area in your breast is numb, a small cut (incision) is made in the skin.  ?? Using the imaging, the doctor will guide the needle into the biopsy area.  ?? A sample of breast tissue is taken through the needle.  ?? A small clip is usually inserted into your breast to mark the biopsy site.  ?? The needle is removed and pressure is put on the needle site to stop any bleeding.  ?? A bandage is put on the needle site.  How long does the test take?  The test will take about 60 minutes. Most of the time is spent preparing for the images and finding the area for the biopsy.  What happens after the test?  ?? You'll be told how long it may take to get your results back.  ?? You will probably be able to go  home right away.  ?? After a specialist looks at the biopsy sample for signs of cancer, your doctor's office will let you know the results.  ?? If the test results aren't clear, you may have another biopsy or test.  How can you care for yourself at home?  ?? You can go back to your usual activities right away. But avoid heavy lifting for 24 hours.  ?? The site may be tender for 2 or 3 days. You may also have some bruising, swelling, or slight bleeding.  ? You can use an ice pack. Put ice or a cold pack on the area for 10 to 20 minutes at a time. Put a thin cloth between the ice and your skin.  ? Ask your doctor if you can take an over-the-counter pain medicine, such as acetaminophen (Tylenol), ibuprofen (Advil, Motrin), or naproxen (Aleve). Be safe with medicines. Read and follow all instructions on the label.  Follow-up care is a key part of your treatment and safety. Be sure to make and go to all appointments, and call your doctor if you are having problems. It's also a good idea to keep a list  of the medicines you take. Ask your doctor when you can expect to have your test results.  Where can you learn more?  Go to [email or web address] and sign in to your MyChart account. Enter Y286 in the [place] box to learn more about "Stereotactic Breast Biopsy: About This Test."     If you do not have an account, please click on the "Sign Up Now" link.  Current as of: November 21, 2017??????????????????????????????Content Version: 12.5  ?? 2006-2020 Healthwise, Incorporated.   Care instructions adapted under license by [facility]. If you have questions about a medical condition or this instruction, always ask your healthcare professional. [place] any warranty or liability for your use of this information.

## 2018-10-14 NOTE — Other (Unsigned)
Patient Acct Nbr: [id number]   Primary AUTH/CERT:   Primary Insurance Company Name: [employer] Plan name: [facility] Dual Complete  Primary Insurance Group Number: [id number]  Primary Insurance Plan Type: Health  Primary Insurance Policy Number: [id number]    Secondary AUTH/CERT:   Secondary Insurance Company Name: [employer] Plan name: [facility]  Secondary Insurance Group Number: [employer]  Secondary Insurance Plan Type: Engineer, structural Number: [id number]

## 2018-10-21 NOTE — Telephone Encounter (Signed)
Spoke to the pt. Let her know that rx sent to [facility] for 90 day supply of tamoxifen on 10-06-2018 with refills. She verbalize understanding and said she will contact the pharmacy for fill.

## 2018-10-21 NOTE — Telephone Encounter (Signed)
Pt called in requesting tamoxifen (NOLVADEX) 20 MG tablet     Please send to [place], [place]

## 2018-10-27 ENCOUNTER — Ambulatory Visit: Primary: Family Medicine

## 2018-11-05 ENCOUNTER — Inpatient Hospital Stay: Admit: 2018-11-05 | Attending: Surgery | Primary: Family Medicine

## 2018-11-05 MED ORDER — LIDOCAINE HCL 1 % IJ SOLN
1 % | Freq: Once | INTRAMUSCULAR | Status: DC
Start: 2018-11-05 — End: 2018-11-06

## 2018-11-05 NOTE — Progress Notes (Signed)
Patient: Dawn Padilla 26-Jun-1950    Are you having discomfort? []  YES   [x]  NO    Have you taken anything for the discomfort? []  YES   [x]  NO just itchy, I let pt know she could remove bandage and put on a reg. Band aid if bothering her.      What have you taken?    Have you used the ice pack? [x]  YES   []  NO    Has it provided relief? [x]  YES   []  NO      Any fever, redness, warmth to the touch? []  YES   [x]  NO    Are you having any drainage from your incision? []  YES   [x]  NO    Patient was reminded to wear a supportive bra for the next day.  [x]  YES   []  NO    Is patient still wearing bra? [x]  YES   []  NO    Patient was reminded No tub bathing or swimming until the incision is healed. [x]  YES   []  NO    Patient was reminded that their surgeon will follow up with the results.  [x]  YES   []  NO    Patient was reminded on keeping the bandage on for the 3 days. [x]  YES   []  NO    Is there anything we could have done to improve your experience here with [place]?   []  YES   [x]  NO            Electronically signed by Ernst Spell on 11/06/2018 at 1:53 PM

## 2018-11-05 NOTE — Patient Instructions (Signed)
Patient:  Dawn Padilla  17-Sep-1950    Patient contacted to remind them to arrive 30 minutes prior to appointment time: is at  9:30 be here 9am       at [facility] [address].     Patient can park in the [employer] or [place].     Patient is able to eat and drink before appointment. Please wear comfortable clothes and supportive bra.    Plan to be here 1-2 hours for procedure.    If the patient has any additional questions advised to call [phone].    Pt has stated she is very scared of this has a bad fear of needles.   I told pt I would be with her the whole time and help her.     Electronically signed by Ernst Spell on 11/04/2018 at 8:39 AM

## 2018-11-05 NOTE — Progress Notes (Signed)
[birth year]    []   Bleeding Tendency   [x]  Cancer (Lt. Breast 2015)     []  Diabetes-Insulin    []  Diabetes- Oral Med   [x]  Fainting/Dizziness (Faints when at the hospital)   []  Seizure Disorder  [x]  Respiratory Disorder (Asthma)  []  Infectious Disease  [x]  High Blood Pressure (on meds. didn't take today)  [] Heart Condition        Current Outpatient Medications:   .  promethazine (PHENERGAN) 25 MG tablet, TAKE ONE TABLET BY MOUTH EVERY 4 HOURS, Disp: , Rfl:   .  predniSONE (DELTASONE) 10 MG tablet, TAKE ONE TABLET BY MOUTH EVERY DAY, Disp: , Rfl:   .  fluticasone (FLONASE) 50 MCG/ACT nasal spray, INTILL ONE SPRAY IN EACH nostril ONCE A DAY Nasally, Disp: , Rfl:   .  NATURAL VITAMIN D-3 125 MCG (5000 UT) TABS tablet, TAKE ONE TABLET BY MOUTH EVERY DAY, Disp: , Rfl:   .  tamoxifen (NOLVADEX) 20 MG tablet, Take 1 tablet by mouth daily, Disp: 90 tablet, Rfl: 3  .  dicyclomine (BENTYL) 20 MG tablet, Take 20 mg by mouth 4 times daily as needed, Disp: , Rfl:   .  PROAIR HFA 108 (90 Base) MCG/ACT inhaler, INHALE TWO PUFFS EVERY 4 HOURS AS NEEDED, Disp: , Rfl: 3  .  alendronate (FOSAMAX) 70 MG tablet, TAKE ONE TABLET BY MOUTH ONCE A WEEK ON MONDAY, Disp: , Rfl: 11  .  Breast Prosthesis MISC, by Does not apply route (Patient not taking: Reported on 10/14/2018), Disp: 1 each, Rfl: 0  .  Surgical Bra MISC, by Does not apply route (Patient not taking: Reported on 10/14/2018), Disp: 12 each, Rfl: 0  .  Calcium Carb-Cholecalciferol (CALCIUM + D3) 600-200 MG-UNIT TABS, Take 1 tablet by mouth daily , Disp: , Rfl:   .  omeprazole (PRILOSEC) 40 MG capsule, Take 40 mg by mouth daily , Disp: , Rfl:   .  cyclobenzaprine (FLEXERIL) 10 MG tablet, Take 10 mg by mouth every evening , Disp: , Rfl:   .  benztropine (COGENTIN) 0.5 MG tablet, Take 0.5 mg by mouth nightly , Disp: , Rfl:   .  hydrALAZINE (APRESOLINE) 25 MG tablet, Take 25 mg by mouth every 12 hours , Disp: , Rfl:   .  gabapentin (NEURONTIN) 100 MG capsule, Take 100 mg by mouth  nightly as needed. , Disp: , Rfl:   .  metoprolol (LOPRESSOR) 100 MG tablet, Take 100 mg by mouth daily , Disp: , Rfl:   .  amLODIPine (NORVASC) 5 MG tablet, Take 5 mg by mouth daily, Disp: , Rfl:   .  atorvastatin (LIPITOR) 20 MG tablet, Take 20 mg by mouth daily, Disp: , Rfl:   .  carbidopa-levodopa (SINEMET) 25-100 MG per tablet, Take 1 tablet by mouth nightly , Disp: , Rfl:   .  lisinopril (PRINIVIL;ZESTRIL) 40 MG tablet, Take 40 mg by mouth daily, Disp: , Rfl:     Allergies as follows:    Codeine; Demerol hcl [meperidine]; Morphine; Nsaids; Percocet [oxycodone-acetaminophen]; and Vicodin [hydrocodone-acetaminophen]        Who Accompanied you here today Self    LMP 68 years old  Height 5'4  Weight 100    Electronically signed by Ernst Spell on 11/05/2018 at 9:17 AM

## 2018-11-05 NOTE — Other (Unsigned)
Patient Acct Nbr: [id number]   Primary AUTH/CERT:   Primary Insurance Company Name: [employer] Plan name: [facility]  Primary Insurance Group Number: [facility]  Primary Insurance Plan Type: Health  Primary Insurance Policy Number: [id number]    Secondary AUTH/CERT:   Secondary Insurance Company Name: [employer] Plan name: [facility]  Secondary Insurance Group Number:   Secondary Insurance Plan Type: Health  Secondary Insurance Policy Number: [id number]

## 2018-11-07 LAB — SURGICAL PATHOLOGY

## 2018-11-11 ENCOUNTER — Telehealth

## 2018-11-11 NOTE — Telephone Encounter (Signed)
Core needle biopsy, right breast, shows high-grade DCIS with comedonecrosis. ER+ 80%, PR+ 30%.  Patient was notified of her biopsy results.  She will discuss treatment options with Dr. Nonda Lou.  She states she has been compliant with taking her tamoxifen.

## 2018-11-13 NOTE — Telephone Encounter (Signed)
Called patient  She recently had a cxr at [facility] 10/06/18  She will come to [place] for labs    Please sign orders

## 2018-11-13 NOTE — Addendum Note (Signed)
Addended by: Malachi Pro on: 11/13/2018 01:45 PM     Modules accepted: Orders

## 2018-11-13 NOTE — Addendum Note (Signed)
Addended byIna Kick on: 11/13/2018 12:59 PM     Modules accepted: Orders

## 2018-11-17 NOTE — Telephone Encounter (Signed)
Pt is returning your call.

## 2018-11-17 NOTE — Telephone Encounter (Signed)
I did not call her- she is coming in tomorrow for ov

## 2018-11-18 ENCOUNTER — Ambulatory Visit
Admit: 2018-11-18 | Discharge: 2018-11-18 | Payer: PRIVATE HEALTH INSURANCE | Attending: Surgery | Primary: Family Medicine

## 2018-11-18 ENCOUNTER — Inpatient Hospital Stay: Attending: Surgery | Primary: Family Medicine

## 2018-11-18 DIAGNOSIS — D0511 Intraductal carcinoma in situ of right breast: Secondary | ICD-10-CM

## 2018-11-18 LAB — CBC WITH AUTO DIFFERENTIAL
Basophils %: 0.3 % (ref 0.0–2.0)
Basophils Absolute: 0 10*3/uL (ref 0.0–0.2)
Eosinophils Absolute: 0.1 10*3/uL (ref 0.0–0.5)
Eosinophils: 1.1 % (ref 1.0–6.0)
Granulocytes %: 83.8 % — ABNORMAL HIGH (ref 40.0–80.0)
Hematocrit: 38.6 % (ref 35.0–47.0)
Hemoglobin: 12.8 g/dL (ref 11.7–16.0)
Lymphocyte %: 12 % — ABNORMAL LOW (ref 20.0–40.0)
Lymphocytes Absolute: 1.2 10*3/uL (ref 1.0–4.3)
MCH: 34.1 pg — ABNORMAL HIGH (ref 26.0–34.0)
MCHC: 33.3 % (ref 32.0–36.0)
MCV: 102.3 fL — ABNORMAL HIGH (ref 79.0–98.0)
MPV: 8.9 fL (ref 7.4–10.4)
Monocytes %: 2.8 % (ref 2.0–10.0)
Monocytes Absolute: 0.3 10*3/uL (ref 0.0–0.8)
Neutrophils Absolute: 8.1 10*3/uL — ABNORMAL HIGH (ref 1.8–7.0)
Platelets: 258 10*3/uL (ref 140–440)
RBC: 3.77 10*6/uL — ABNORMAL LOW (ref 3.80–5.20)
RDW: 14.6 % — ABNORMAL HIGH (ref 11.5–14.5)
WBC: 9.7 10*3/uL (ref 3.6–10.7)

## 2018-11-18 LAB — HEPATIC FUNCTION PANEL
ALT: 15 U/L (ref 0–34)
AST: 33 U/L (ref 15–46)
Albumin,Serum: 4.1 g/dL (ref 3.5–5.0)
Alkaline Phosphatase: 49 U/L (ref 38–126)
Bilirubin, Direct: 0 mg/dL (ref 0.0–0.3)
Total Bilirubin: 0.2 mg/dL (ref 0.2–1.3)
Total Protein: 7 g/dL (ref 6.3–8.2)

## 2018-11-18 LAB — BASIC METABOLIC PANEL
Anion Gap: 10 NA
BUN: 20 mg/dL (ref 7–20)
CO2: 25 mmol/L (ref 22–30)
Calcium: 10.1 mg/dL (ref 8.4–10.4)
Chloride: 101 mmol/L (ref 98–107)
Creatinine: 0.84 mg/dL (ref 0.52–1.25)
EGFR IF NonAfrican American: 70.4 mL/min (ref >60)
Glucose: 76 mg/dL (ref 70–100)
Potassium: 4.4 mmol/L (ref 3.5–5.1)
Sodium: 136 mmol/L (ref 135–145)
eGFR African American: 81.5 mL/min (ref >60)

## 2018-11-18 NOTE — Other (Unsigned)
Patient Acct Nbr: [id number]   Primary AUTH/CERT:   Primary Insurance Company Name: [employer] Plan name: [facility]  Primary Insurance Group Number: [facility]  Primary Insurance Plan Type: Health  Primary Insurance Policy Number: [id number]    Secondary AUTH/CERT:   Secondary Insurance Company Name: [employer] Plan name: [facility]  Secondary Insurance Group Number: [employer]  Secondary Insurance Plan Type: Health  Secondary Insurance Policy Number: [id number]

## 2018-11-18 NOTE — Progress Notes (Signed)
Chief Complaint   Patient presents with   . Breast Cancer     History of Present Illness:  Dawn Padilla is a 68 y.o. female s/p STEREO bx of RIGHT calcifications-- High grade DCIS  ER+PR+      shehas the following oncology history  1. 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  2. Currently on tamoxifen since 11/16/2013.  Treatments include:  Surgery-10/13/13 left mastectomy  Chemotherapy- Oncotype=10  Endocrine therapy-tamoxifen, tolerating well  Radiation therapy-none  Last mammogram date 10/06/18.BIRADS 4- calcifications RIGHT breast suspicious and stereotactic bx recommended  No new breast concerns. No wt loss or cough  Has had wt loss due to dental issues-and issues with her family.  + anxiety  + fam history of breast cancer: sister at 59  Dad had pancreatic, mother lung cancer  [facility] ( will review)    Options for surgery were discussed today  Options include lumpectomy with radiation vs mastectomy ( she declined reconstruction before and currently)  She does not want radiation        Imaging:     Addendum    ADDENDUM: This addendum is being provided to report the pathology results    and recommendation for the     previous report.            PROCEDURE: MG BX BREAST 1ST LESION STRTCTC RT: RIGHT BREAST - November 05, 2018 - ACCESSION #: [id number]    Prior study comparison: October 14, 2018, right breast MG breast tomosynthesis    right performed at [facility]. October 06, 2018, right breast MG breast    tomosynthesis scr right performed at     [facility]. October 01, 2017, right breast MG breast    tomosynthesis scr right performed     at [facility]. Aug 16, 2016, right breast MG breast    tomosynthesis scr right     performed at [facility].        Marland Kitchen        PATHOLOGY RESULTS: MALIGNANT    Imaging and pathology are  concordant.    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        MG SURGICAL SPECIMEN: RIGHT BREAST - November 05, 2018 - ACCESSION #:    [id number]            RECOMMENDATION: Treatment plan.        DIAGNOSIS:     BREAST, RIGHT "CALCS," NEEDLE CORE BIOPSY - HIGH-GRADE DUCTAL   CARCINOMA IN SITU WITH COMEDONECROSIS AND MICROCALCIFICATIONS     COMMENT: Immunohistochemical stains for p63 and myosin fail to   demonstrate invasion.     ER and PR IMMUNOHISTOCHEMISTRY:     ESTROGEN RECEPTORS STATUS: POSITIVE   Percentage of tumor cells with nuclear positivity: 80%   Average intensity of staining: Intermediate     PROGESTERONE RECEPTOR STATUS: POSITIVE   Percentage of tumor cells with nuclear positivity: 30%   Average intensity of staining: Weak     COLD ISCHEMIA/FIXATION TIMES: Meets CAP/ASCO requirements     TESTING PERFORMED ON BLOCK: A1     QA statement: This case has been reviewed as part of intradepartmental   quality review: HK, JW       Estrogen/Progesterone Receptor Test Details:  Hormone receptor testing  is performed by immunoperoxidase method according to the ASCO/CAP   guidelines. Formalin fixed paraffin embedded material was used, the   formalin fixation time of which is at least six hours and not more than   72 hours, unless otherwise specified. ER clone [id number], and PR clone [id number]   on Ventana Ultra Platform using ultraview detection kit. ([place],   [place], [place]). The percentage of tumor nuclei were evaluated along with   the intensity of staining as outlined in the ASCO/CAP guideline. A   tumor that is positive for estrogen progesterone receptor shows nuclear   immunostaining in greater than or equal to 1% of the tumor cells.   Appropriate positive and negative controls were run with the patient's   specimen, and when appropriate, internal controls were evaluated for   expected pattern of staining.     Her2 neu immunohistochemistry was evaluated using  revised ASO/CAP   October 2013 criteria, clone [id number] Ventana. Per Laboratory policy, all   2+ (equivocal) results on IHC will be reflexed to HER2 FISH. In such   circumstances, treatment decision should be made only after FISH   results are available.     Immunohistochemistry assays have not been validated on decalcified   tissues. In such circumstances, results should be interpreted with   caution, given the increased possibility of false negative reactions on   decalcified specimens.     The use of one or more reagents in the above tests was regulated as an   analyte- specific reagent (ASR). These tests were developed and their   performance characteristics determined by the clinical laboratories of   [facility]. They have not been cleared by the [place] Food and   Drug Administration (FDA). The FDA has determined that such clearance   or approval is not necessary. Haze Justin     HCK/HCK       <Sign Out Dr.   Jolayne Haines,   M.D.   ________________________________________________________________________     CLINICAL INFORMATION: Right breast calcs     Review of Systems:  Review of Systems   Constitutional: Positive for unexpected weight change (wt loss - chronic). Negative for activity change, diaphoresis, fatigue and fever.   HENT: Negative for sore throat, trouble swallowing and voice change.    Eyes: Negative for photophobia and visual disturbance.   Respiratory: Negative for cough, choking, shortness of breath, wheezing and stridor.    Cardiovascular: Negative for chest pain and palpitations.   Gastrointestinal: Negative for abdominal pain, constipation, nausea and vomiting.   Endocrine: Negative for cold intolerance, heat intolerance, polydipsia, polyphagia and polyuria.   Musculoskeletal: Positive for arthralgias and back pain (chronic). Negative for myalgias, neck pain and neck stiffness.   Skin: Negative for color change, pallor, rash and wound.   Allergic/Immunologic: Negative for  immunocompromised state.   Neurological: Positive for numbness. Negative for tremors and weakness.   Hematological: Negative for adenopathy. Does not bruise/bleed easily.   Psychiatric/Behavioral: Negative for confusion and decreased concentration. The patient is nervous/anxious.        Past Medical History:   Diagnosis Date   . Asthma    . Bleeding ulcer    . Breast cancer ([place])     left breast   . 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 Chronic back pain    . Hiatal hernia    . Hyperlipidemia    . Hypertension    . Neuropathy    .  Osteoarthritis      Past Surgical History:   Procedure Laterality Date   . APPENDECTOMY     . BRAIN SURGERY  2003   . BREAST BIOPSY Left 1974    benign   . BREAST BIOPSY Left 08/17/13    malignant   . BREAST SURGERY Left 10/13/13    mastectomy   . CAROTID ENDARTERECTOMY  2006   . CHOLECYSTECTOMY     . COLONOSCOPY     . HYSTERECTOMY  1977   . LUNG SURGERY Right 1976   . TONSILLECTOMY     . UPPER GASTROINTESTINAL ENDOSCOPY       Allergies   Allergen Reactions   . Codeine    . Demerol Hcl [Meperidine]    . Morphine    . Nsaids Other (See Comments)     Bleeding ulcer   . Percocet [Oxycodone-Acetaminophen]    . Vicodin [Hydrocodone-Acetaminophen]      Temp 97.3 F (36.3 C)   Current Outpatient Medications on File Prior to Visit   Medication Sig Dispense Refill   . promethazine (PHENERGAN) 25 MG tablet TAKE ONE TABLET BY MOUTH EVERY 4 HOURS     . predniSONE (DELTASONE) 10 MG tablet TAKE ONE TABLET BY MOUTH EVERY DAY     . fluticasone (FLONASE) 50 MCG/ACT nasal spray INTILL ONE SPRAY IN EACH nostril ONCE A DAY Nasally     . NATURAL VITAMIN D-3 125 MCG (5000 UT) TABS tablet TAKE ONE TABLET BY MOUTH EVERY DAY     . tamoxifen (NOLVADEX) 20 MG tablet Take 1 tablet by mouth daily 90 tablet 3   . dicyclomine (BENTYL) 20 MG tablet Take 20 mg by mouth 4 times daily as needed     . PROAIR HFA 108 (90 Base) MCG/ACT inhaler INHALE TWO PUFFS EVERY 4 HOURS AS NEEDED  3   .  alendronate (FOSAMAX) 70 MG tablet TAKE ONE TABLET BY MOUTH ONCE A WEEK ON MONDAY  11   . Surgical Bra MISC by Does not apply route 12 each 0   . Calcium Carb-Cholecalciferol (CALCIUM + D3) 600-200 MG-UNIT TABS Take 1 tablet by mouth daily      . omeprazole (PRILOSEC) 40 MG capsule Take 40 mg by mouth daily      . cyclobenzaprine (FLEXERIL) 10 MG tablet Take 10 mg by mouth every evening      . benztropine (COGENTIN) 0.5 MG tablet Take 0.5 mg by mouth nightly      . hydrALAZINE (APRESOLINE) 25 MG tablet Take 25 mg by mouth every 12 hours      . gabapentin (NEURONTIN) 100 MG capsule Take 100 mg by mouth nightly as needed.      . metoprolol (LOPRESSOR) 100 MG tablet Take 100 mg by mouth daily      . amLODIPine (NORVASC) 5 MG tablet Take 5 mg by mouth daily     . atorvastatin (LIPITOR) 20 MG tablet Take 20 mg by mouth daily     . carbidopa-levodopa (SINEMET) 25-100 MG per tablet Take 1 tablet by mouth nightly      . lisinopril (PRINIVIL;ZESTRIL) 40 MG tablet Take 40 mg by mouth daily     . Breast Prosthesis MISC by Does not apply route (Patient not taking: Reported on 10/14/2018) 1 each 0     No current facility-administered medications on file prior to visit.          Physical Exam:  Physical Exam  Constitutional:       General:  She is not in acute distress.     Appearance: She is well-developed. She is not diaphoretic.   HENT:      Head: Normocephalic and atraumatic.   Eyes:      General: No scleral icterus.     Conjunctiva/sclera: Conjunctivae normal.   Neck:      Musculoskeletal: Normal range of motion and neck supple.      Thyroid: No thyromegaly.      Vascular: No JVD.      Trachea: No tracheal deviation.   Cardiovascular:      Heart sounds: Normal heart sounds.   Pulmonary:      Effort: Pulmonary effort is normal. No respiratory distress.      Breath sounds: Normal breath sounds. No stridor.   Chest:      Chest wall: No mass, deformity, swelling or tenderness.      Breasts: Breasts are symmetrical.         Right:  Skin change (bruising) present. No inverted nipple, mass, nipple discharge or tenderness.         Left: No inverted nipple, mass, nipple discharge, skin change or tenderness.       Musculoskeletal: Normal range of motion.         General: No tenderness.      Right lower leg: No edema.      Left lower leg: No edema.   Lymphadenopathy:      Cervical: No cervical adenopathy.      Right cervical: No superficial, deep or posterior cervical adenopathy.     Left cervical: No superficial, deep or posterior cervical adenopathy.      Upper Body:      Right upper body: No supraclavicular, axillary or pectoral adenopathy.      Left upper body: No supraclavicular, axillary or pectoral adenopathy.   Skin:     General: Skin is warm and dry.      Coloration: Skin is not pale.      Findings: No erythema or rash.   Neurological:      General: No focal deficit present.      Mental Status: She is alert and oriented to person, place, and time.      Cranial Nerves: No cranial nerve deficit.      Gait: Gait normal.   Psychiatric:         Mood and Affect: Mood normal.         Behavior: Behavior normal.         Thought Content: Thought content normal.          Assessment:   1. Ductal carcinoma in situ of right breast    2. Malignant neoplasm of central portion of left breast in female, estrogen receptor positive ([place])    Cancer Staging  Ductal carcinoma in situ (DCIS) of right breast  Staging form: Breast, AJCC 8th Edition  - Clinical stage from 11/11/2018: Stage 0 (cTis (DCIS), cN0, cM0, G3, ER+, PR+, HER2: Not Assessed) - Signed by Malachi Pro, MD on 11/11/2018    Malignant neoplasm of left breast [facility])  Staging form: Breast, AJCC 7th Edition  - Clinical stage from 06/30/2014: Stage IIA (T1c, N1, M0) - Signed by Lennon Alstrom, MD on 06/30/2014        Plan:    RIGHT simple mastectomy with SLNB    Risks of breast surgery were explained including but not limited to bleeding, infection,seroma formation,  Lymphedema.    Will  review prior genetics  report      Lennon Alstrom MD  11/18/18  Please disregard any typographical errors. This note was dictated using voice recognition software.

## 2018-11-19 NOTE — Progress Notes (Signed)
BREAST TUMOR BOARD RECOMMENDATIONS  Consensus Recommendation Summary  Privileged Information    TUMOR BOARD CLINICAL SUMMARY    Basic Demographic Information    Dawn Padilla  female  Jun 25, 1950  68 y.o.  [id number]    Presentation Date and Presenting Physician    ??? Date of presentation (mm/dd/yyy):  11/21/2018    ??? Presenting physician:  Dr. Rico Junker [facility]    Presentation Type    [x] Prospective  [] Informational   [] Follow up   [] Educational discussion    Brief Clinical Summary    67 y.o. female presented with calcifications in right breast.     She??has the following oncology history.    1. ??Left breast cancer, T1 N1 M0, stage IIa, ER/PR positive, HER-2 negative, status post mastectomy.  2. ??Currently on tamoxifen since 11/16/2013.  ??Treatments include:  Surgery-10/13/13 left mastectomy  Chemotherapy- Oncotype=10  Endocrine therapy-tamoxifen, tolerating well  Radiation therapy-none  Last mammogram date??10/06/18.??BIRADS??4- calcifications RIGHT breast suspicious and stereotactic bx recommended  No new breast concerns. No wt loss or cough  Has had wt loss due to dental issues-and??issues with her family.  + anxiety  + fam history of breast cancer: sister at 40  Dad had pancreatic, mother lung cancer  [facility] ( will review)  ??  Options for surgery were discussed today  Options include lumpectomy with radiation vs mastectomy ( she declined reconstruction before and currently)  ??? She does not want radiation    Needle core biopsy performed on 11/05/18 showed BREAST, RIGHT ??"CALCS," NEEDLE CORE BIOPSY - HIGH-GRADE DUCTAL CARCINOMA IN SITU WITH COMEDONECROSIS AND MICROCALCIFICATIONS.  ER+, PR+     Stage    Cancer Staging  Ductal carcinoma in situ (DCIS) of right breast  Staging form: Breast, AJCC 8th Edition  - Clinical stage from 11/11/2018: Stage 0 (cTis (DCIS), cN0, cM0, G3, ER+, PR+, HER2: Not Assessed) - Signed by Malachi Pro, MD on 11/11/2018    Malignant  neoplasm of left breast [facility])  Staging form: Breast, AJCC 7th Edition  - Clinical stage from 06/30/2014: Stage IIA (T1c, N1, M0) - Signed by Lennon Alstrom, MD on 06/30/2014  ???       Specific Question or Problem to be Discussed    ??? Treatment Plan      TUMOR BOARD RECOMMENDATIONS    Imaging and Interventional Recommendations    [] NONE     ??? Imaging    [] Chest X-ray [] DEXA   [] Chest CT [] PET Scan   [] Abdomen/Pelvis CT [] Bone Scan   [] MRI Breast [] Other-   [] Second look ultrasound [] Other -     ??? Physiologic Testing    [] Pulmonary Function Studies [] Venous Duplex Study   [] Cardiac Stress Test - Evaluation [] Other      ??? Diagnostic and Interventional Recommendations    [] Breast biopsy - Site- [] Lymph node biopsy- Site   [] Oncotype [] Image guided clip placement   [] Mammaprint [] Other-   [] Other genomic test-  [] Other-     Consultation Recommendations    [] Behavioral Health [] Pathology   [] Clinical Trial  [] Plastic Surgery   [] Distress Management [] Radiation Oncology   [] Fertility Counseling [] Physical Therapy   [] Financial Counseling [] Occupational Therapy   [] Genetic Counseling/Testing [] Nutrition   [] Gynecologic Oncology [  x] Surgery   [] Hospice [] Social Work   [] Medical Oncology [] Chemotherapy vascular access   [] Pain Management [x] Endocrine therapy   [] Palliative Care [] [place]     Guidelines consulted for recommendations    [x] NCCN Notes:    [] ASCO Notes:   [] ASBS Notes:   [] ASTRO Notes:    [] Other Notes:     Available Protocol Recommendation    [] Yes Protocol:      Comments    Mastectomy w/SLNB ( pt declined reconstruction)  Report Completed by    Frutoso Chase, RN  4/74/2595

## 2018-11-25 NOTE — Progress Notes (Signed)
Reached out to pt to discuss available supportive services and address any barriers. Left msg.

## 2018-11-27 NOTE — Other (Unsigned)
Patient Acct Nbr: [id number]   Primary AUTH/CERT:   [place] Name: [employer] Plan name: [facility] Dual Complete  Primary Insurance Group Number: [employer]  Primary Insurance Plan Type: Health  Primary Insurance Policy Number: [id number]

## 2018-11-28 ENCOUNTER — Encounter: Attending: Surgery | Primary: Family Medicine

## 2018-12-05 ENCOUNTER — Inpatient Hospital Stay: Admit: 2018-12-05 | Attending: Surgery | Primary: Family Medicine

## 2018-12-05 LAB — BASIC METABOLIC PANEL W/ REFLEX TO MG FOR LOW K
Anion Gap: 8 NA
BUN: 20 mg/dL (ref 7–20)
CO2: 28 mmol/L (ref 22–30)
Calcium: 9.1 mg/dL (ref 8.4–10.4)
Chloride: 104 mmol/L (ref 98–107)
Creatinine: 0.92 mg/dL (ref 0.52–1.25)
EGFR IF NonAfrican American: 63 mL/min (ref >60)
Glucose: 73 mg/dL (ref 70–100)
Potassium: 4.5 mmol/L (ref 3.5–5.1)
Sodium: 140 mmol/L (ref 135–145)
eGFR African American: 73 mL/min (ref >60)

## 2018-12-05 LAB — CBC
Hematocrit: 38.5 % (ref 35.0–47.0)
Hemoglobin: 12.7 g/dL (ref 11.7–16.0)
MCH: 34.2 pg — ABNORMAL HIGH (ref 26.0–34.0)
MCHC: 33.1 % (ref 32.0–36.0)
MCV: 103.4 fL — ABNORMAL HIGH (ref 79.0–98.0)
MPV: 8.5 fL (ref 7.4–10.4)
Platelets: 256 10*3/uL (ref 140–440)
RBC: 3.72 10*6/uL — ABNORMAL LOW (ref 3.80–5.20)
RDW: 15 % — ABNORMAL HIGH (ref 11.5–14.5)
WBC: 8.2 10*3/uL (ref 3.6–10.7)

## 2018-12-05 NOTE — Other (Unsigned)
Patient Acct Nbr: [id number]   Primary AUTH/CERT:   [place] Name: [employer] Plan name: [facility]  Primary Insurance Group Number: [facility]  Primary Insurance Plan Type: Health  Primary Insurance Policy Number: [id number]    Secondary AUTH/CERT:   [place] Name: [employer] Plan name: [facility] MyCrMcaid Second  Secondary Insurance Group Number: [employer]  Secondary Insurance Plan Type: Barrister's clerk Number: [id number]

## 2018-12-05 NOTE — H&P (Deleted)
Patient was initially registered under an incorrect medical record number for DOS [id number]    Please refer to MRN  [id number]    Chart correction in process.

## 2018-12-05 NOTE — Progress Notes (Signed)
Labs obtained on first attempt with 22 gauge needle at R WRIST site, patient tolerated well, site benign.

## 2018-12-05 NOTE — Telephone Encounter (Signed)
Disregard this telephone encounter.

## 2018-12-05 NOTE — Discharge Instructions (Signed)
Do NOT take the following medications on the morning of surgery LISINOPRIL AND FOSAMAX    TAKE the following medications the morning of your surgery TYLENOL IF NEEDED, PROAIR IF NEEDED, GABAPENTIN, PHENERGAN IF NEEDED, LIPITOR, HYDRALAZINE, TAMOXIFEN, METOPROLOL, PREDNISONE, FLONASE, AMLODIPINE, BENTYL IF NEEDED, AND PRILOSEC    You may take your prescription pain medications.  You may take Tylenol (Acetaminophen) if needed for pain.    No Motrin, Ibuprofen, or Advil 24 hours prior to surgery, or longer if instructed by your surgeon.     No Aleve or Naprosyn 3 days prior to surgery, or longer if instructed by your surgeon.    Additional instructions PLEASE FOLLOW ANY ADDITIONAL INSTRUCTIONS THAT DR. VAN FOSSEN MAY HAVE GIVEN YOU.     You will receive a reminder call the day before surgery with your Same Day Surgery arrival time.    If you have specific questions, please call your surgeon.     Please bring your [facility] Surgical Information folder on the day of surgery.    Please mark the last dose taken (date and time ) on your Daily Medications List provided in your After Visit Summary.     Please bring a photo ID and insurance information
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
## 2018-12-11 NOTE — Telephone Encounter (Signed)
Patient has received PCP clearance after the MRI. Reviewed and placed in chart. Patient may proceed with surgery.

## 2018-12-12 NOTE — Telephone Encounter (Signed)
Pt knows she is cleared for surgery?

## 2018-12-12 NOTE — Telephone Encounter (Signed)
Called and advised patient cleared for surgery

## 2018-12-15 ENCOUNTER — Inpatient Hospital Stay: Payer: PRIVATE HEALTH INSURANCE

## 2018-12-15 MED ORDER — LIDOCAINE HCL (PF) 2 % IJ SOLN
2 | INTRAMUSCULAR | Status: AC
Start: 2018-12-15 — End: 2018-12-15

## 2018-12-15 MED ORDER — DEXAMETHASONE SODIUM PHOSPHATE 10 MG/ML IJ SOLN
10 | INTRAMUSCULAR | Status: AC
Start: 2018-12-15 — End: 2018-12-15

## 2018-12-15 MED ORDER — FAMOTIDINE 20 MG PO TABS
20 MG | Freq: Once | ORAL | Status: AC
Start: 2018-12-15 — End: 2018-12-15
  Administered 2018-12-15: 14:00:00 20 mg via ORAL

## 2018-12-15 MED ORDER — LACTATED RINGERS IV SOLN
INTRAVENOUS | Status: DC
Start: 2018-12-15 — End: 2018-12-16
  Administered 2018-12-15: 21:00:00 via INTRAVENOUS

## 2018-12-15 MED ORDER — BUPIVACAINE-EPINEPHRINE (PF) 0.5% -1:200000 IJ SOLN
INTRAMUSCULAR | Status: AC
Start: 2018-12-15 — End: 2018-12-15

## 2018-12-15 MED ORDER — PHENYLEPHRINE 0.1 MG/ML ANESTHESIA IJ
Status: AC
Start: 2018-12-15 — End: 2018-12-15

## 2018-12-15 MED ORDER — PROPOFOL 1000 MG/100ML IV EMUL
1000 | INTRAVENOUS | Status: AC
Start: 2018-12-15 — End: 2018-12-15

## 2018-12-15 MED ORDER — GABAPENTIN 100 MG PO CAPS
100 MG | Freq: Once | ORAL | Status: AC
Start: 2018-12-15 — End: 2018-12-15
  Administered 2018-12-15: 14:00:00 100 mg via ORAL

## 2018-12-15 MED ORDER — ONDANSETRON HCL 4 MG/2ML IJ SOLN
4 MG/2ML | Freq: Once | INTRAMUSCULAR | Status: AC | PRN
Start: 2018-12-15 — End: 2018-12-15
  Administered 2018-12-15: 18:00:00 4 mg via INTRAVENOUS

## 2018-12-15 MED ORDER — CLINDAMYCIN PHOSPHATE 600 MG/4ML IJ SOLN
600 | INTRAMUSCULAR | Status: AC
Start: 2018-12-15 — End: 2018-12-15

## 2018-12-15 MED ORDER — DEXMEDETOMIDINE HCL 200 MCG/2ML IV SOLN
200 | INTRAVENOUS | Status: AC
Start: 2018-12-15 — End: 2018-12-15

## 2018-12-15 MED ORDER — ROCURONIUM BROMIDE 50 MG/5ML IV SOLN
50 | INTRAVENOUS | Status: AC
Start: 2018-12-15 — End: 2018-12-15

## 2018-12-15 MED ORDER — ESMOLOL HCL 100 MG/10ML IV SOLN
100 | INTRAVENOUS | Status: AC
Start: 2018-12-15 — End: 2018-12-15

## 2018-12-15 MED ORDER — NORMAL SALINE FLUSH 0.9 % IV SOLN
0.9 % | INTRAVENOUS | Status: DC | PRN
Start: 2018-12-15 — End: 2018-12-15

## 2018-12-15 MED ORDER — HYDRALAZINE HCL 20 MG/ML IJ SOLN
20 MG/ML | INTRAMUSCULAR | Status: DC | PRN
Start: 2018-12-15 — End: 2018-12-15

## 2018-12-15 MED ORDER — ACETAMINOPHEN 325 MG PO TABS
325 MG | Freq: Once | ORAL | Status: DC
Start: 2018-12-15 — End: 2018-12-15

## 2018-12-15 MED ORDER — NORMAL SALINE FLUSH 0.9 % IV SOLN
0.9 % | Freq: Two times a day (BID) | INTRAVENOUS | Status: DC
Start: 2018-12-15 — End: 2018-12-16

## 2018-12-15 MED ORDER — PREDNISONE 10 MG PO TABS
10 MG | Freq: Every day | ORAL | Status: DC
Start: 2018-12-15 — End: 2018-12-16
  Administered 2018-12-16 (×2): 10 mg via ORAL

## 2018-12-15 MED ORDER — METHYLENE BLUE (ANTIDOTE) 50 MG/10ML IV SOLN
50 | INTRAVENOUS | Status: AC
Start: 2018-12-15 — End: 2018-12-15

## 2018-12-15 MED ORDER — TRAMADOL HCL 50 MG PO TABS
50 MG | Freq: Four times a day (QID) | ORAL | Status: DC | PRN
Start: 2018-12-15 — End: 2018-12-16
  Administered 2018-12-15: 21:00:00 50 mg via ORAL

## 2018-12-15 MED ORDER — GABAPENTIN 100 MG PO CAPS
100 MG | Freq: Every evening | ORAL | Status: DC | PRN
Start: 2018-12-15 — End: 2018-12-16

## 2018-12-15 MED ORDER — PROPOFOL 200 MG/20ML IV EMUL
200 | INTRAVENOUS | Status: AC
Start: 2018-12-15 — End: 2018-12-15

## 2018-12-15 MED ORDER — ONDANSETRON HCL 4 MG/2ML IJ SOLN
4 | INTRAMUSCULAR | Status: AC
Start: 2018-12-15 — End: 2018-12-15

## 2018-12-15 MED ORDER — HEPARIN SODIUM (PORCINE) 5000 UNIT/ML IJ SOLN
5000 UNIT/ML | Freq: Three times a day (TID) | INTRAMUSCULAR | Status: DC
Start: 2018-12-15 — End: 2018-12-16
  Administered 2018-12-16: 10:00:00 5000 [IU] via SUBCUTANEOUS

## 2018-12-15 MED ORDER — HYDROMORPHONE HCL 1 MG/ML IJ SOLN
1 MG/ML | INTRAMUSCULAR | Status: DC | PRN
Start: 2018-12-15 — End: 2018-12-15

## 2018-12-15 MED ORDER — LACTATED RINGERS IV SOLN
INTRAVENOUS | Status: DC
Start: 2018-12-15 — End: 2018-12-15
  Administered 2018-12-15: 15:00:00 via INTRAVENOUS

## 2018-12-15 MED ORDER — LABETALOL HCL 5 MG/ML IV SOLN
5 MG/ML | INTRAVENOUS | Status: DC | PRN
Start: 2018-12-15 — End: 2018-12-15

## 2018-12-15 MED ORDER — NORMAL SALINE FLUSH 0.9 % IV SOLN
0.9 | INTRAVENOUS | Status: DC | PRN
Start: 2018-12-15 — End: 2018-12-16

## 2018-12-15 MED ORDER — SUCCINYLCHOLINE CHLORIDE 200 MG/10ML IV SOSY
200 | INTRAVENOUS | Status: AC
Start: 2018-12-15 — End: 2018-12-15

## 2018-12-15 MED ORDER — PANTOPRAZOLE SODIUM 40 MG PO TBEC
40 MG | Freq: Every day | ORAL | Status: DC
Start: 2018-12-15 — End: 2018-12-16
  Administered 2018-12-16: 10:00:00 40 mg via ORAL

## 2018-12-15 MED ORDER — TRAMADOL HCL 50 MG PO TABS
50 MG | Freq: Four times a day (QID) | ORAL | Status: DC | PRN
Start: 2018-12-15 — End: 2018-12-16
  Administered 2018-12-16 (×2): 100 mg via ORAL

## 2018-12-15 MED ORDER — KETAMINE HCL 50 MG/5ML IJ SOSY
50 | INTRAMUSCULAR | Status: AC
Start: 2018-12-15 — End: 2018-12-15

## 2018-12-15 MED ORDER — LIDOCAINE HCL (PF) 1 % IJ SOLN
1 % | Freq: Once | INTRAMUSCULAR | Status: DC | PRN
Start: 2018-12-15 — End: 2018-12-15

## 2018-12-15 MED ORDER — ALBUTEROL SULFATE HFA 108 (90 BASE) MCG/ACT IN AERS
108 | RESPIRATORY_TRACT | Status: AC
Start: 2018-12-15 — End: 2018-12-15

## 2018-12-15 MED ORDER — FENTANYL CITRATE (PF) 100 MCG/2ML IJ SOLN
100 MCG/2ML | INTRAMUSCULAR | Status: DC | PRN
Start: 2018-12-15 — End: 2018-12-15

## 2018-12-15 MED ORDER — GLYCOPYRROLATE 0.2 MG/ML IJ SOLN
0.2 | INTRAMUSCULAR | Status: AC
Start: 2018-12-15 — End: 2018-12-15

## 2018-12-15 MED ORDER — PROMETHAZINE HCL 25 MG/ML IJ SOLN
25 MG/ML | Freq: Once | INTRAMUSCULAR | Status: DC | PRN
Start: 2018-12-15 — End: 2018-12-15

## 2018-12-15 MED ORDER — ALPRAZOLAM 0.25 MG PO TBDP
0.25 MG | ORAL | Status: DC | PRN
Start: 2018-12-15 — End: 2018-12-15

## 2018-12-15 MED ORDER — NORMAL SALINE FLUSH 0.9 % IV SOLN
0.9 % | Freq: Two times a day (BID) | INTRAVENOUS | Status: DC
Start: 2018-12-15 — End: 2018-12-15

## 2018-12-15 MED ORDER — MIDAZOLAM HCL 2 MG/2ML IJ SOLN
2 MG/ML | INTRAMUSCULAR | Status: DC | PRN
Start: 2018-12-15 — End: 2018-12-15
  Administered 2018-12-15: 15:00:00 1 mg via INTRAVENOUS

## 2018-12-15 MED ORDER — ALBUTEROL SULFATE (2.5 MG/3ML) 0.083% IN NEBU
RESPIRATORY_TRACT | Status: DC | PRN
Start: 2018-12-15 — End: 2018-12-16

## 2018-12-15 MED ORDER — SUGAMMADEX SODIUM 200 MG/2ML IV SOLN
200 | INTRAVENOUS | Status: AC
Start: 2018-12-15 — End: 2018-12-15

## 2018-12-15 MED ORDER — CEFAZOLIN SODIUM-DEXTROSE 1-4 GM/50ML-% IV SOLN
1-4 GM/50ML-% | INTRAVENOUS | Status: AC
Start: 2018-12-15 — End: 2018-12-15
  Administered 2018-12-15: 16:00:00 1 g via INTRAVENOUS

## 2018-12-15 MED ORDER — SODIUM CHLORIDE (PF) 0.9 % IJ SOLN
0.9 | INTRAMUSCULAR | Status: AC
Start: 2018-12-15 — End: 2018-12-15

## 2018-12-15 MED ORDER — DIPHENHYDRAMINE HCL 50 MG/ML IJ SOLN
50 MG/ML | Freq: Once | INTRAMUSCULAR | Status: DC | PRN
Start: 2018-12-15 — End: 2018-12-15

## 2018-12-15 MED ORDER — METOPROLOL TARTRATE 100 MG PO TABS
100 MG | Freq: Every day | ORAL | Status: DC
Start: 2018-12-15 — End: 2018-12-16
  Administered 2018-12-16: 13:00:00 100 mg via ORAL

## 2018-12-15 MED ORDER — ONDANSETRON HCL 4 MG/2ML IJ SOLN
4 MG/2ML | Freq: Four times a day (QID) | INTRAMUSCULAR | Status: DC | PRN
Start: 2018-12-15 — End: 2018-12-16

## 2018-12-15 MED ORDER — BENZTROPINE MESYLATE 0.5 MG PO TABS
0.5 MG | Freq: Every evening | ORAL | Status: DC
Start: 2018-12-15 — End: 2018-12-16
  Administered 2018-12-16: 03:00:00 0.5 mg via ORAL

## 2018-12-15 MED ORDER — FLUTICASONE PROPIONATE 50 MCG/ACT NA SUSP
50 MCG/ACT | Freq: Every day | NASAL | Status: DC
Start: 2018-12-15 — End: 2018-12-16
  Administered 2018-12-16 (×2): 1 via NASAL

## 2018-12-15 MED ORDER — TAMOXIFEN CITRATE 10 MG PO TABS
10 MG | Freq: Every day | ORAL | Status: DC
Start: 2018-12-15 — End: 2018-12-16
  Administered 2018-12-16 (×2): 20 mg via ORAL

## 2018-12-15 MED ORDER — OXYCODONE HCL 5 MG PO TABS
5 MG | ORAL | Status: DC | PRN
Start: 2018-12-15 — End: 2018-12-15

## 2018-12-15 MED ORDER — MIDAZOLAM HCL 2 MG/2ML IJ SOLN
2 MG/ML | INTRAMUSCULAR | Status: DC | PRN
Start: 2018-12-15 — End: 2018-12-15

## 2018-12-15 MED ORDER — AMLODIPINE BESYLATE 5 MG PO TABS
5 MG | Freq: Every day | ORAL | Status: DC
Start: 2018-12-15 — End: 2018-12-16
  Administered 2018-12-16: 13:00:00 5 mg via ORAL

## 2018-12-15 MED ORDER — ACETAMINOPHEN 325 MG PO TABS
325 MG | Freq: Four times a day (QID) | ORAL | Status: DC
Start: 2018-12-15 — End: 2018-12-16
  Administered 2018-12-15 – 2018-12-16 (×3): 650 mg via ORAL

## 2018-12-15 MED ORDER — APREPITANT 40 MG PO CAPS
40 MG | Freq: Once | ORAL | Status: AC
Start: 2018-12-15 — End: 2018-12-15
  Administered 2018-12-15: 14:00:00 40 mg via ORAL

## 2018-12-15 MED ORDER — FENTANYL CITRATE (PF) 100 MCG/2ML IJ SOLN
100 MCG/2ML | INTRAMUSCULAR | Status: DC | PRN
Start: 2018-12-15 — End: 2018-12-15
  Administered 2018-12-15: 18:00:00 25 ug via INTRAVENOUS

## 2018-12-15 MED FILL — DEXAMETHASONE SODIUM PHOSPHATE 10 MG/ML IJ SOLN: 10 mg/mL | INTRAMUSCULAR | Qty: 1

## 2018-12-15 MED FILL — CEFAZOLIN SODIUM-DEXTROSE 1-4 GM/50ML-% IV SOLN: 1-4 GM/50ML-% | INTRAVENOUS | Qty: 50

## 2018-12-15 MED FILL — TRAMADOL HCL 50 MG PO TABS: 50 mg | ORAL | Qty: 1

## 2018-12-15 MED FILL — DEXMEDETOMIDINE HCL 200 MCG/2ML IV SOLN: 200 MCG/2ML | INTRAVENOUS | Qty: 2

## 2018-12-15 MED FILL — TAMOXIFEN CITRATE 10 MG PO TABS: 10 mg | ORAL | Qty: 2

## 2018-12-15 MED FILL — MIDAZOLAM HCL 2 MG/2ML IJ SOLN: 2 mg/mL | INTRAMUSCULAR | Qty: 2

## 2018-12-15 MED FILL — XYLOCAINE-MPF 2 % IJ SOLN: 2 % | INTRAMUSCULAR | Qty: 5

## 2018-12-15 MED FILL — ACETAMINOPHEN 325 MG PO TABS: 325 mg | ORAL | Qty: 2

## 2018-12-15 MED FILL — PROVAYBLUE 50 MG/10ML IV SOLN: 50 MG/10ML | INTRAVENOUS | Qty: 10

## 2018-12-15 MED FILL — KETAMINE HCL 50 MG/5ML IJ SOSY: 50 MG/5ML | INTRAMUSCULAR | Qty: 5

## 2018-12-15 MED FILL — FLUTICASONE PROPIONATE 50 MCG/ACT NA SUSP: 50 MCG/ACT | NASAL | Qty: 16

## 2018-12-15 MED FILL — GLYCOPYRROLATE 0.2 MG/ML IJ SOLN: 0.2 mg/mL | INTRAMUSCULAR | Qty: 1

## 2018-12-15 MED FILL — PROPOFOL 1000 MG/100ML IV EMUL: 1000 MG/100ML | INTRAVENOUS | Qty: 100

## 2018-12-15 MED FILL — ESMOLOL HCL 100 MG/10ML IV SOLN: 100 MG/10ML | INTRAVENOUS | Qty: 10

## 2018-12-15 MED FILL — SODIUM CHLORIDE (PF) 0.9 % IJ SOLN: 0.9 % | INTRAMUSCULAR | Qty: 10

## 2018-12-15 MED FILL — ONDANSETRON HCL 4 MG/2ML IJ SOLN: 4 MG/2ML | INTRAMUSCULAR | Qty: 2

## 2018-12-15 MED FILL — ROCURONIUM BROMIDE 50 MG/5ML IV SOLN: 50 MG/5ML | INTRAVENOUS | Qty: 5

## 2018-12-15 MED FILL — CLINDAMYCIN PHOSPHATE 600 MG/4ML IJ SOLN: 600 MG/4ML | INTRAMUSCULAR | Qty: 8

## 2018-12-15 MED FILL — PREDNISONE 10 MG PO TABS: 10 mg | ORAL | Qty: 1

## 2018-12-15 MED FILL — BRIDION 200 MG/2ML IV SOLN: 200 MG/2ML | INTRAVENOUS | Qty: 2

## 2018-12-15 MED FILL — PROPOFOL 200 MG/20ML IV EMUL: 200 MG/20ML | INTRAVENOUS | Qty: 20

## 2018-12-15 MED FILL — FENTANYL CITRATE (PF) 100 MCG/2ML IJ SOLN: 100 MCG/2ML | INTRAMUSCULAR | Qty: 2

## 2018-12-15 MED FILL — BENZTROPINE MESYLATE 0.5 MG PO TABS: 0.5 mg | ORAL | Qty: 1

## 2018-12-15 MED FILL — SUCCINYLCHOLINE CHLORIDE 200 MG/10ML IV SOSY: 200 MG/10ML | INTRAVENOUS | Qty: 10

## 2018-12-15 MED FILL — EMEND 40 MG PO CAPS: 40 mg | ORAL | Qty: 1

## 2018-12-15 MED FILL — PHENYLEPHRINE 0.1 MG/ML ANESTHESIA IJ: Qty: 10

## 2018-12-15 MED FILL — SENSORCAINE-MPF/EPINEPHRINE 0.5% -1:200000 IJ SOLN: INTRAMUSCULAR | Qty: 30

## 2018-12-15 MED FILL — ALBUTEROL SULFATE HFA 108 (90 BASE) MCG/ACT IN AERS: 108 (90 Base) MCG/ACT | RESPIRATORY_TRACT | Qty: 8.5

## 2018-12-15 MED FILL — GABAPENTIN 100 MG PO CAPS: 100 mg | ORAL | Qty: 1

## 2018-12-15 MED FILL — FAMOTIDINE 20 MG PO TABS: 20 mg | ORAL | Qty: 1

## 2018-12-15 NOTE — Progress Notes (Signed)
General Surgery Brief Progress Note    Pt seen and examined at the bedside. Pt states that her pain is well controlled, and she denies any n/v, f/c, or dyspnea. Pt confirms appetite, and states her UOP is good. Serosanguinous output is noted in the bulb.    Physical exam:  Inferior flap of the right breast appears well perfused. Incision is C/D/I w/o edema, or hematoma noted.

## 2018-12-15 NOTE — H&P (Signed)
I have examined the patient and reviewed the history and physical and find no relevant changes.    I have reviewed with the patient and/or family the risks, benefits, and alternatives to the procedure and they have agreed to proceed.    IMPRESSION:RIGHT breast DCIS    PLAN:RIGHT simple mastectomy with SLNB R axilla    Lennon Alstrom, MD

## 2018-12-15 NOTE — Procedures (Signed)
Department of Anesthesiology  Erector Spinae NERVE BLOCK  Procedure performed on 12/15/2018    Name: Dawn Padilla MRN: [id number] DOB: 12/09/1950    (Age-68 y.o.)       PRIMARY INDICATION: Block is completed at the request of the attending surgeon for post-op pain control    Informed Consent: Patient was identified, procedure risks, benefits, options and complications were explained, patient was given the opportunity to ask questions and have them answered and patient gives informed consent to proceed with the nerve block procedure.    Preprocedure Site Marking:  Right side marked.    Time out performed at: 1107 by Lu Duffel, APRN - CRNA Procedure Start Time:  1107   Procedure End Time:  1114       Monitors:    Pulse Ox, Blood Pressure, EKG and EtCO2   Oxygen applied to patient:   None   Sedation medications:    Fentanyl: None    Midazolam: 1 mg    Other: na   Position: Sitting  Level of sedation: na  Barrier precautions and sterile technique: Skin was prepped with chloroprep, face mask was worn and sterile gloves were worn    Procedure performed by: Lu Duffel, APRN - CRNA   Needle: 22g 71mm  Technique: Ultrasound guidance"  Local Anesthetic for block: Bupivicaine 0.375% with epinephrine 1:400,000 and Dexamethasone 0.1mg /ml utilizing a total of 34ml of the solution.  Level of injection: Thoracic 7  Procedure: relevant anatomy identified (ribs,pleura, muscles), Local anesthetic injected incrementally with intermittent aspiration every 48ml, no EKG changes, no symptoms of toxicity, Local anesthetic spread visualized, Negative for blood on aspiration, Normal resistance with injection and tip of the needle identified by ultrasound during injection  Ultrasound technique: In plane    Ultrasound procedure image placed in the patients record: Yes    Additional Notes: Patient tolerated procedure well.  No complications noted.    Electronically signed by: Lu Duffel, APRN - CRNA     Date: 12/15/2018 at 11:18  AM

## 2018-12-15 NOTE — Progress Notes (Signed)
DR. Noel Gerold PRESENT TO REVIEW PATIENT'S ALLERGIES. PERMISSION GIVEN TO GIVE PATIENT FENTANYL FOR PAIN.

## 2018-12-15 NOTE — Brief Op Note (Signed)
Brief Postoperative Note    SHIESHA JAHN  Date of Birth:  1950/04/08  [id number]    Pre-operative Diagnosis: DCIS RIGHT breast  Cancer Staging  Ductal carcinoma in situ (DCIS) of right breast  Staging form: Breast, AJCC 8th Edition  - Clinical stage from 11/11/2018: Stage 0 (cTis (DCIS), cN0, cM0, G3, ER+, PR+, HER2: Not Assessed) - Signed by Malachi Pro, MD on 11/11/2018    Malignant neoplasm of left breast [facility])  Staging form: Breast, AJCC 7th Edition  - Clinical stage from 06/30/2014: Stage IIA (T1c, N1, M0) - Signed by Lennon Alstrom, MD on 06/30/2014      Post-operative Diagnosis: Same    Procedure: RIGHT simple mastectomy with SLNB R axilla, injection lymphoseek/methylene blue and ID R axillary SLN    Anesthesia: General    Surgeons/Assistants: Marycarmen Hagey/[place]    Estimated Blood Loss: less than 50     Complications: None    Specimens: Was Obtained: RIGHT breast, R axillary SLNs    Findings: 2 hot blue LN    SENTINEL LYMPH NODE BIOPSY OPERATIVE NOTE TEMPLATE      NEOADJUVANT THERAPY GIVEN No      LYMPHATIC MAPPING AGENT USED TECHNETIUM 31 AND LYMPHAZURIN      ALL COLORED NODES OR NONCOLORED NODES AT THE END OF A COLORED LYPMHATIC CHANNEL WERE REMOVED Yes      ALL SIGNFIICANTLY RADIOACTIVE NODES WERE REMOVED Yes      ALL PALPABLY SUSPICIOUS NODES WERE REMOVED Yes      ALL PATHOLOGICALLY-INVOLVED NODES MARKED WITH BIOPSY CLIPS WERE IDENTIFIED AND REMOVED  NA       Electronically signed by Lennon Alstrom, MD on 12/15/2018 at 1:09 PM

## 2018-12-15 NOTE — Anesthesia Post-Procedure Evaluation (Signed)
Department of Anesthesiology                             Post Anesthesia Note    Name: Dawn Padilla MRN: [id number] DOB: 06/06/1950    (Age-68 y.o.)     This note was authored with review of the notes of the PACU and/or Same day stay and/or direct communication with the patient with an anesthesia care provider.     POST  ANESTHESIA  EVALUATION      Patient Vitals for the past 2 hrs:   BP Temp Temp src Pulse Resp SpO2   12/15/18 1400 (!) 143/96 -- -- 68 18 100 %   12/15/18 1345 (!) 142/100 -- -- 76 18 99 %   12/15/18 1330 (!) 144/83 -- -- 75 18 99 %   12/15/18 1313 130/77 97.6 ??F (36.4 ??C) Temporal 82 20 99 %        BP (!) 143/96    Pulse 68    Temp 97.6 ??F (36.4 ??C) (Temporal)    Resp 18    Ht 5\' 4"  (1.626 m)    Wt 101 lb (45.8 kg)    SpO2 100%    BMI 17.34 kg/m??      Pain Level: 6    Vital signs: Respiratory and Cardiovascular function within normal limits (See Nursing Record)  Level of consciousness: Patient awake/ Able to participate      Return to baseline mental status: Yes Airway status: Normal   Pain controlled: Yes Dental injury: No   Nausea/Vomiting controlled: Yes Complications: no   Well hydrated: Yes      Patient Experience comments: None expressed    Electronically signed by Landry Corporal, APRN - CRNA on 12/15/2018 at 2:06 PM

## 2018-12-15 NOTE — Other (Unsigned)
Patient Acct Nbr: [id number]   Primary AUTH/CERT:   [place] Name: [place] name: [facility] Dual Complete  Primary Insurance Group Number: [employer]  Primary Insurance Plan Type: Health  Primary Insurance Policy Number: [id number]    Secondary AUTH/CERT:   [place] Name: [employer] Plan name: [facility] MyCrMcaid Second  Secondary Insurance Group Number: [employer]  Secondary Insurance Plan Type: Barrister's clerk Number: [id number]

## 2018-12-15 NOTE — Op Note (Signed)
PATIENTARVILLA, HOMSEY  MEDICAL RECORD #:         [id number]  ADMISSION DATE:           12/15/2018  SURGERY DATE:             12/15/2018  ACCOUNT #:                [id number]  DATE OF BIRTH:            [birth year]  AGE:                      68  ADMITTING PHYSICIAN:      Nicholes Mango, MD  ATTENDING PHYSICIAN:      Nicholes Mango, MD  DICTATING PHYSICIAN:      Nicholes Mango, MD                               OPERATIVE RECORD      Procedure:  1. RIGHT SIMPLE MASTECTOMY WITH SENTINEL LYMPH NODE BIOPSY, RIGHT        AXILLA.  2. INJECTION OF [place] AND METHYLENE BLUE.  3. IDENTIFICATION OF RIGHT AXILLARY SENTINEL LYMPH NODES.    Preoperative Diagnosis:  Ductal carcinoma in situ, high-grade, right  breast.    Postoperative Diagnosis:  Ductal carcinoma in situ, high-grade, right  breast.    Anesthesia:  General.    Assistant:  Ivan Croft.    Fluids:  Crystalloid.    Estimated Blood Loss:  Minimal.    Drains Placed:  One 10 mm JP.    Clinical History:  This is a 68 year old [place] American female with  a history of left breast cancer and she had a mastectomy on that side  several years ago.  She has been on antihormone medication.  She was  found to have some indeterminate calcifications in the right breast,  underwent a stereotactic biopsy which showed high-grade DCIS, which  was ER positive PR positive.  She was given surgical options.  She did  have scattered calcifications over the right breast.  She elected to  have mastectomy.  She declined any type of reconstruction.  She does  understand that she can do that in a delayed fashion if she desires.  Risks and benefits of surgery were explained including but not limited  to bleeding, infection, possible seroma or lymphedema.  She understood  and agreed to proceed with surgery.    Description of Procedure:  The patient was brought to the operating  room and placed in a supine position.  General anesthesia was induced.  I  injected approximately 500 microcuries of technetium-71m in divided  doses around the nipple and also injected 2 cc of methylene blue  diluted in 2 cc of sterile saline.  The right breast was massaged for  5 minutes.  After prepping and draping in the usual sterile manner, an  elliptical incision was made around the nipple-areolar complex.  Skin  flaps were raised superiorly to the clavicle, medially to the sternum,  inferiorly to the edge of the breast tissue and laterally to  latissimus dorsi.  The breast and pectoralis fascia were removed in a  medial to lateral fashion.  The clavipectoral fascia was opened.  The  Neoprobe in the technetium setting was used to isolate a hot lymph  node.  She also had a blue lymph node with a blue lymphatic, which was  very hot with counts over 2000.  The second lymph node had counts  about 500.  Both of these were removed.  There was no other palpable  nodes or any suspicious nodes.  The right breast was removed.  Margins  were marked short superior, long lateral.  The wound was irrigated.  Hemostasis controlled.  A separate stab incision was made for a 10 mm  drain.  This was sewn in place.  The skin incision was closed in a  subcuticular fashion.  She tolerated the procedure well.    cc:  Dr. Lyda Kalata  Dr. Wyatt Mage    [employer] Job ID: [id number]        Nicholes Mango, MD    DOD:12/15/2018 01:04 P  VLV/dsk  DOT:12/15/2018 01:58 P  Job Number: [id number]  Document Number: [id number]  cc:   Nicholes Mango, MD        [place]. [place]        [place] [address]

## 2018-12-15 NOTE — Progress Notes (Signed)
Belongings taken to H5 5129    Annaliesa Blann P, MA

## 2018-12-16 LAB — BASIC METABOLIC PANEL
Anion Gap: 8 NA
BUN: 16 mg/dL (ref 7–20)
CO2: 24 mmol/L (ref 22–30)
Calcium: 8.7 mg/dL (ref 8.4–10.4)
Chloride: 102 mmol/L (ref 98–107)
Creatinine: 0.8 mg/dL (ref 0.52–1.25)
EGFR IF NonAfrican American: 74.6 mL/min (ref >60)
Glucose: 122 mg/dL — ABNORMAL HIGH (ref 70–100)
Potassium: 4.5 mmol/L (ref 3.5–5.1)
Sodium: 134 mmol/L — ABNORMAL LOW (ref 135–145)
eGFR African American: 86.5 mL/min (ref >60)

## 2018-12-16 LAB — CBC
Hematocrit: 38.6 % (ref 35.0–47.0)
Hemoglobin: 13.1 g/dL (ref 11.7–16.0)
MCH: 34.6 pg — ABNORMAL HIGH (ref 26.0–34.0)
MCHC: 33.9 % (ref 32.0–36.0)
MCV: 102 fL — ABNORMAL HIGH (ref 79.0–98.0)
MPV: 7.5 fL (ref 7.4–10.4)
Platelets: 303 10*3/uL (ref 140–440)
RBC: 3.79 10*6/uL — ABNORMAL LOW (ref 3.80–5.20)
RDW: 14.1 % (ref 11.5–14.5)
WBC: 20.6 10*3/uL — ABNORMAL HIGH (ref 3.6–10.7)

## 2018-12-16 MED ORDER — TRAMADOL HCL 50 MG PO TABS
50 MG | ORAL_TABLET | Freq: Four times a day (QID) | ORAL | 0 refills | Status: AC | PRN
Start: 2018-12-16 — End: 2018-12-21

## 2018-12-16 MED FILL — PREDNISONE 10 MG PO TABS: 10 mg | ORAL | Qty: 1

## 2018-12-16 MED FILL — ACETAMINOPHEN 325 MG PO TABS: 325 mg | ORAL | Qty: 2

## 2018-12-16 MED FILL — PANTOPRAZOLE SODIUM 40 MG PO TBEC: 40 mg | ORAL | Qty: 1

## 2018-12-16 MED FILL — AMLODIPINE BESYLATE 5 MG PO TABS: 5 mg | ORAL | Qty: 1

## 2018-12-16 MED FILL — TRAMADOL HCL 50 MG PO TABS: 50 mg | ORAL | Qty: 2

## 2018-12-16 MED FILL — BENZTROPINE MESYLATE 0.5 MG PO TABS: 0.5 mg | ORAL | Qty: 1

## 2018-12-16 MED FILL — TAMOXIFEN CITRATE 10 MG PO TABS: 10 mg | ORAL | Qty: 2

## 2018-12-16 MED FILL — METOPROLOL TARTRATE 100 MG PO TABS: 100 mg | ORAL | Qty: 1

## 2018-12-16 MED FILL — HEPARIN SODIUM (PORCINE) 5000 UNIT/ML IJ SOLN: 5000 [IU]/mL | INTRAMUSCULAR | Qty: 1

## 2018-12-16 NOTE — Progress Notes (Signed)
Department of Surgery - Progress Note - Surg 2    PATIENT NAME: Dawn Padilla     MRN:  [id number]  ADMIT DATE:  12/15/2018    TODAY'S DATE:  12/16/2018  CURRENT ROOM: 5129/512901     PAST 24 HOUR EVENTS / SUBJECTIVE  NAEO. Pt states she is doing well this morning. Pain well controlled w/prn pain control. Denies f/c, n/v, or dyspnea. Confirms flatus, but no BM as of yet. Appetite well, and tolerating CLD w/o issue.    OBJECTIVE    VITALS:  BP 120/61    Pulse 72    Temp 98.1 ??F (36.7 ??C) (Oral)    Resp 18    Ht 5\' 4"  (1.626 m)    Wt 101 lb (45.8 kg)    SpO2 95%    BMI 17.34 kg/m??     PHYSICAL EXAM:  CONSTITUTIONAL: NAD, A&O X3. Lying in bed comfortably.  EYES: No scleral icterus  CHEST: Resp effort easy and unlabored. Clear to auscultation bilaterally. RRR with no m/r/g  ABDOMEN: Soft, NT, ND, with no rebound or guarding  INCISION: C/D/I. Appropriately ttp. JP Drain w/~3-5cc's serosang output noted in bulb. Inferior flap well perfused. No edema, erythema, or drainage noted.  SKIN: Warm and dry. Well perfused    INTAKE/OUTPUT:    Date 12/16/18 0000 - 12/16/18 2359   Shift 0000-0759 0800-1559 1600-2359 24 Hour Total   INTAKE   Shift Total(mL/kg)       OUTPUT   Urine(mL/kg/hr) 800   800   Drains 60   60   Shift Total(mL/kg) 860(18.8)   860(18.8)   Weight (kg) 45.8 45.8 45.8 45.8     I/O last 3 completed shifts:  In: [id number] [P.O.:400; I.V.:873]  Out: 950 [Urine:900; Drains:40; Blood:10]  I/O this shift:  In: -   Out: 860 [Urine:800; Drains:60]    Data  No results for input(s): WBC, HGB, HCT, PLT in the last 72 hours.  No results for input(s): NA, K, CL, CO2, BUN, CREATININE, GLUCOSE in the last 72 hours.  No results for input(s): AST, ALT, ALB, BILITOT, ALKPHOS in the last 72 hours.    Current Inpatient Medications    Current Facility-Administered Medications: amLODIPine (NORVASC) tablet 5 mg, 5 mg, Oral, Daily  benztropine (COGENTIN) tablet 0.5 mg, 0.5 mg, Oral, Nightly  fluticasone (FLONASE) 50 MCG/ACT nasal spray 1  spray, 1 spray, Each Nostril, Daily  gabapentin (NEURONTIN) capsule 100 mg, 100 mg, Oral, Nightly PRN  metoprolol (LOPRESSOR) tablet 100 mg, 100 mg, Oral, Daily  pantoprazole (PROTONIX) tablet 40 mg, 40 mg, Oral, QAM AC  predniSONE (DELTASONE) tablet 10 mg, 10 mg, Oral, Daily  albuterol (PROVENTIL) nebulizer solution 2.5 mg, 2.5 mg, Nebulization, Q4H PRN  tamoxifen (NOLVADEX) tablet 20 mg, 20 mg, Oral, Daily  sodium chloride flush 0.9 % injection 10 mL, 10 mL, Intravenous, 2 times per day  sodium chloride flush 0.9 % injection 10 mL, 10 mL, Intravenous, PRN  ondansetron (ZOFRAN) injection 4 mg, 4 mg, Intravenous, Q6H PRN  lactated ringers infusion, , Intravenous, Continuous  traMADol (ULTRAM) tablet 50 mg, 50 mg, Oral, Q6H PRN **OR** traMADol (ULTRAM) tablet 100 mg, 100 mg, Oral, Q6H PRN  acetaminophen (TYLENOL) tablet 650 mg, 650 mg, Oral, 4 times per day  heparin (porcine) injection 5,000 Units, 5,000 Units, Subcutaneous, 3 times per day    ASSESSMENT AND PLAN    68 y.o. female status post R simple mastectomy with SLND for high grade DCIS on POD1. In pain this  morning but incision c/d/i with no palpable hematoma or seroma, mild tenderness to palpation. Serosanguinous drain output. Decreased sensation on L compared to R but no acute changes.     - PT evaluation before DC  - Drain education via nursing before discharge  - Advance diet to regular  - Continue prn pain management  - Ambulate with assistance of nursing staff  - Dispo: DC home this pm    Eldridge Dace, MS4  [place], PGY1  [place], [place]  12/16/2018 6:21 AM

## 2018-12-16 NOTE — Discharge Instructions (Signed)
Discharge Instructions    Call your surgeon in 1 to 2 days to schedule a follow-up appointment in 1-2 weeks.    OK to shower.  OK for activity as tolerated.  No lifting over 15 pounds.    Wound Care: keep wound clean and dry and no ice/heat packs to incision    If you have steri-strips, you may remove them 5 days after your surgery. If your steri-strips fall off sooner, you may leave them off.      If you have staples, they can be removed in 14 days after your surgery by your surgeon or PCP.     If you have a drain, please record daily output amounts and bring the recordings with you to your office follow up.    No driving while taking narcotic pain medications.      You may take an over the counter stool softener while on narcotics for constipation as needed (colace, miralax, etc).    Call your Physician or return to the Emergency Room if you experience:    -New or increased pain.    -New or increased bleeding.    -Nausea & vomitting.    -Fever & chills.    -Shortness of breath.    -Chest pain.    -Abdominal distention.

## 2018-12-16 NOTE — Progress Notes (Signed)
General Surgery Brief Progress Note    Pt seen and examined at the bedside. Discussed her going to a SNF for care, and pt really did not want this. Pt was agreeable to home care for physical therapy post-operatively. Discussed this with Shawnee Knapp, RN.     Still planning for D/C home today.

## 2018-12-16 NOTE — Discharge Summary (Signed)
Discharge Summary    Dawn Padilla  DOB:  1950-11-02  MRN:  [id number]    ADMIT DATE: 12/15/2018  DISCHARGE DATE:  12/16/2018    PRIMARY CARE PHYSICIAN: America Brown, DO    VISIT STATUS: OPB    CODE STATUS: Full Code    DISCHARGE DIAGNOSES: Active Problems:    Ductal carcinoma in situ of right breast    S/P right mastectomy  Resolved Problems:    * No resolved hospital problems. *      HOSPITAL COURSE:  Patient presented on 12/15/2018 for R. Simple mastectomy. Pt tolerated the procedure well and was transferred to PACU w/o issue. Overnight, pt states she tolerated her CLD well, and was ready to advance her diet in am. Pain well controlled with prn pain medicine. PT eval suggested SNF placement. Pt declined. Agreed to home health.     At the time of discharge, the patient's vital signs were within normal limits. Patient was voiding spontaneously, tolerating a diet, ambulating independently, and having bowel function. Patient's pain was controlled with PO pain meds. Pt was discharged with instructions as follows.     PROBLEM LIST:   Patient Active Problem List   Diagnosis   ??? Malignant neoplasm of left breast ([place])   ??? Encounter for follow-up surveillance of breast cancer   ??? Hypertension   ??? Asthma   ??? Norovirus   ??? Severe malnutrition ([place])   ??? Ductal carcinoma in situ of right breast   ??? S/P right mastectomy       DISCHARGE MEDICATIONS:    [place], [place]   Home Medication Instructions [id number]    Printed on:12/16/18 1253   Medication Information                      acetaminophen (TYLENOL) 500 MG tablet  Take 1,000 mg by mouth every 6 hours as needed for Pain             alendronate (FOSAMAX) 70 MG tablet  TAKE ONE TABLET BY MOUTH ONCE A WEEK ON MONDAY             amLODIPine (NORVASC) 5 MG tablet  Take 5 mg by mouth daily             atorvastatin (LIPITOR) 20 MG tablet  Take 20 mg by mouth daily             benztropine (COGENTIN) 0.5 MG tablet  Take 0.5 mg by mouth nightly              Calcium  Carb-Cholecalciferol (CALCIUM + D3) 600-200 MG-UNIT TABS  Take 1 tablet by mouth daily              cyclobenzaprine (FLEXERIL) 10 MG tablet  Take 10 mg by mouth every evening              dicyclomine (BENTYL) 20 MG tablet  Take 20 mg by mouth 4 times daily as needed             fluticasone (FLONASE) 50 MCG/ACT nasal spray  INTILL ONE SPRAY IN EACH nostril ONCE A DAY Nasally             gabapentin (NEURONTIN) 100 MG capsule  Take 100 mg by mouth nightly as needed.              hydrALAZINE (APRESOLINE) 25 MG tablet  Take 25 mg by mouth every 12 hours  lisinopril (PRINIVIL;ZESTRIL) 40 MG tablet  Take 40 mg by mouth daily             loperamide (IMODIUM) 2 MG capsule  Take 2 mg by mouth as needed for Diarrhea             metoprolol (LOPRESSOR) 100 MG tablet  Take 100 mg by mouth daily              NATURAL VITAMIN D-3 125 MCG (5000 UT) TABS tablet  TAKE ONE TABLET BY MOUTH EVERY DAY             predniSONE (DELTASONE) 10 MG tablet  TAKE ONE TABLET BY MOUTH EVERY DAY             PROAIR HFA 108 (90 Base) MCG/ACT inhaler  INHALE TWO PUFFS EVERY 4 HOURS AS NEEDED             promethazine (PHENERGAN) 25 MG tablet  every 4 hours as needed              tamoxifen (NOLVADEX) 20 MG tablet  Take 1 tablet by mouth daily             traMADol (ULTRAM) 50 MG tablet  Take 1 tablet by mouth every 6 hours as needed for Pain for up to 5 days. Intended supply: 3 days. Take lowest dose possible to manage pain                 DIET: DIET GENERAL;    ACTIVITY: No heavy lifting.    WOUND CARE: keep wound clean and dry and ice to area for comfort    OTHER INSTRUCTIONS: See discharge instructions  ______________________________________________________________________  BMI CLASSIFICATION:  - Body mass index is 17.34 kg/m??.  - Underweight (BMI <18.5)    DISPOSITION: Home    Follow up with America Brown, DO  [facility] [address] [place] [address]  [phone]          Lennon Alstrom, [place]  [place] [address]  [phone]            SIGNED:  Sander Radon, MD   12/16/2018, 12:53 PM

## 2018-12-16 NOTE — Care Coordination-Inpatient (Signed)
Patient refusing SNF per PT recommendations. Dr Roselyn Meier spoke with patient and she is agreeable to home PT OT.    Home care liaison, Tammy, aware.    Electronically signed by Anselm Lis, RN on 12/16/18 at 12:09 PM EDT
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
## 2018-12-16 NOTE — Progress Notes (Signed)
Discharge instructions given to pt with good understanding

## 2018-12-18 LAB — SURGICAL PATHOLOGY

## 2018-12-24 ENCOUNTER — Ambulatory Visit
Admit: 2018-12-24 | Discharge: 2018-12-24 | Payer: PRIVATE HEALTH INSURANCE | Attending: Surgery | Primary: Family Medicine

## 2018-12-24 DIAGNOSIS — D0511 Intraductal carcinoma in situ of right breast: Secondary | ICD-10-CM

## 2018-12-24 NOTE — Progress Notes (Signed)
HPI: s/p /RIGHT simple mastectomy with SLNB- doing well  Drainage 40 cc yesterday    Pathology:DIAGNOSIS:     A. RIGHT AXILLA SENTINEL LYMPH NODES, DISSECTION - FIVE LYMPH NODES,   NEGATIVE FOR MALIGNANCY       B. RIGHT BREAST, MASTECTOMY - DUCTAL CARCINOMA IN SITU, HIGH GRADE.   MARGINS NEGATIVE. SEE BELOW.       SPECIMEN   Procedure: Total mastectomy   Specimen Laterality: Right   TUMOR   Histologic Type: Ductal carcinoma in situ   Size (Extent) of DCIS: Estimated size (extent) of DCIS is at least   (Millimeters) - 2 mm   Number of Blocks with DCIS: 2   Number of Blocks Examined: 16   Nuclear Grade: Grade III (high)   Necrosis: Not identified   Microcalcifications: Present in nonneoplastic tissue   MARGINS   Margins: Uninvolved by DCIS   Distance from Closest Margin (Millimeters): 5 mm   Closest Margin(s): Posterior   LYMPH NODES   Regional Lymph Nodes: Uninvolved by tumor cells   Total Number of Lymph Nodes Examined: 5   Number of Sentinel Nodes Examined: 5   PATHOLOGIC STAGE CLASSIFICATION (pTNM, AJCC 8th Edition)   Note: Reporting of pT, pN, and (when applicable) pM categories is based   on information available to the pathologist at the time the report is   issued. Assignment of Pathologic Prognostic Stage Group is the   responsibility of the managing physician and not the pathologist.   Primary Tumor (pT): pTis (DCIS)   Regional Lymph Nodes Modifier: (sn): Sentinel node(s) evaluated   Regional Lymph Nodes (pN): pN0   ADDITIONAL FINDINGS   Additional Findings: Intraductal papilloma     Exam: well healed scar, no sign of infection or hematoma    Dawn Padilla was seen today for post-op check.    Diagnoses and all orders for this visit:    Ductal carcinoma in situ of right breast    FU next week for drain removal

## 2018-12-31 ENCOUNTER — Ambulatory Visit
Admit: 2018-12-31 | Discharge: 2018-12-31 | Payer: PRIVATE HEALTH INSURANCE | Attending: Surgery | Primary: Family Medicine

## 2018-12-31 DIAGNOSIS — D0511 Intraductal carcinoma in situ of right breast: Secondary | ICD-10-CM

## 2018-12-31 NOTE — Progress Notes (Signed)
HPI: s/p RIGHT mastectomy for DCIS  Drains less  Doing well    Pathology:  DIAGNOSIS:     A. RIGHT AXILLA SENTINEL LYMPH NODES, DISSECTION - FIVE LYMPH NODES,   NEGATIVE FOR MALIGNANCY       B. RIGHT BREAST, MASTECTOMY - DUCTAL CARCINOMA IN SITU, HIGH GRADE.   MARGINS NEGATIVE. SEE BELOW.       SPECIMEN   Procedure: Total mastectomy   Specimen Laterality: Right   TUMOR   Histologic Type: Ductal carcinoma in situ   Size (Extent) of DCIS: Estimated size (extent) of DCIS is at least   (Millimeters) - 2 mm   Number of Blocks with DCIS: 2   Number of Blocks Examined: 16   Nuclear Grade: Grade III (high)   Necrosis: Not identified   Microcalcifications: Present in nonneoplastic tissue   MARGINS   Margins: Uninvolved by DCIS   Distance from Closest Margin (Millimeters): 5 mm   Closest Margin(s): Posterior   LYMPH NODES   Regional Lymph Nodes: Uninvolved by tumor cells   Total Number of Lymph Nodes Examined: 5   Number of Sentinel Nodes Examined: 5   PATHOLOGIC STAGE CLASSIFICATION (pTNM, AJCC 8th Edition)   Note: Reporting of pT, pN, and (when applicable) pM categories is based   on information available to the pathologist at the time the report is   issued. Assignment of Pathologic Prognostic Stage Group is the   responsibility of the managing physician and not the pathologist.   Primary Tumor (pT): pTis (DCIS)   Regional Lymph Nodes Modifier: (sn): Sentinel node(s) evaluated   Regional Lymph Nodes (pN): pN0   ADDITIONAL FINDINGS   Additional Findings: Intraductal papilloma       Exam:  Tawan was seen today for post-op check.    Diagnoses and all orders for this visit:    Ductal carcinoma in situ of right breast    S/P right mastectomy    Malignant neoplasm of central portion of left breast in female, estrogen receptor positive ([place])    FU one week  Continues on tamoxifen

## 2019-01-07 ENCOUNTER — Ambulatory Visit
Admit: 2019-01-07 | Discharge: 2019-01-07 | Payer: PRIVATE HEALTH INSURANCE | Attending: Surgery | Primary: Family Medicine

## 2019-01-07 DIAGNOSIS — D0511 Intraductal carcinoma in situ of right breast: Secondary | ICD-10-CM

## 2019-01-07 NOTE — Progress Notes (Signed)
HPI: s/p RIGHT mastectomy, drain removed last week  Pathology: DCIS   Exam: small seroma. Aspirated 265 cc yellow fluid  Diagnoses and all orders for this visit:    Ductal carcinoma in situ of right breast    S/P right mastectomy    Malignant neoplasm of central portion of left breast in female, estrogen receptor positive ([place])    continue tamoxifen  Fu 2 weeks

## 2019-01-21 ENCOUNTER — Ambulatory Visit
Admit: 2019-01-21 | Discharge: 2019-01-21 | Payer: PRIVATE HEALTH INSURANCE | Attending: Surgery | Primary: Family Medicine

## 2019-01-21 DIAGNOSIS — D0511 Intraductal carcinoma in situ of right breast: Secondary | ICD-10-CM

## 2019-01-21 MED ORDER — SURGICAL BRA
0 refills | Status: DC
Start: 2019-01-21 — End: 2020-05-10

## 2019-01-21 MED ORDER — BREAST PROSTHESIS
0 refills | Status: DC
Start: 2019-01-21 — End: 2019-05-28

## 2019-01-21 NOTE — Progress Notes (Addendum)
HPI: s/p RIGHT mastectomy ( has had bilateral mastectomies now)  Some pain right arm  Exam:well healed scar, small seroma, no sign of infection  Bee was seen today for post-op check.    Diagnoses and all orders for this visit:    Ductal carcinoma in situ of right breast  -     [facility] (Upper Extremities ONLY) - [facility], [place]; by Does not apply route Bilateral mastectomies  -     Surgical Bra MISC; by Does not apply route S/p bilateral mastectomies    Malignant neoplasm of central portion of left breast in female, estrogen receptor positive ([place])  -     [facility] (Upper Extremities ONLY) - [facility] Center, [place]; by Does not apply route Bilateral mastectomies  -     Surgical Bra MISC; by Does not apply route S/p bilateral mastectomies    Right arm pain  -     [facility] (Upper Extremities ONLY) - Breast Center, [facility]    Cancer Staging  Ductal carcinoma in situ of right breast  Staging form: Breast, AJCC 8th Edition  - Clinical stage from 11/11/2018: Stage 0 (cTis (DCIS), cN0, cM0, G3, ER+, PR+, HER2: Not Assessed) - Signed by Malachi Pro, MD on 11/11/2018    Malignant neoplasm of left breast [facility])  Staging form: Breast, AJCC 7th Edition  - Clinical stage from 06/30/2014: Stage IIA (T1c, N1, M0) - Signed by Lennon Alstrom, MD on 06/30/2014  Continue tamoxifen  FU 4 mos for survivorship visit

## 2019-01-21 NOTE — Addendum Note (Signed)
Addended by: Edmund Hilda on: 01/21/2019 01:05 PM     Modules accepted: Orders

## 2019-02-03 NOTE — Other (Unsigned)
Patient Acct Nbr: [id number]   Primary AUTH/CERT:   [place] Name: [place] name: [facility] Dual Complete  Primary Insurance Group Number: [employer]  Primary Insurance Plan Type: Health  Primary Insurance Policy Number: [id number]    Secondary AUTH/CERT:   [place] Name: [employer] Plan name: [facility] MyCrMcaid Second  Secondary Insurance Group Number: [employer]  Secondary Insurance Plan Type: Barrister's clerk Number: [id number]

## 2019-02-06 ENCOUNTER — Inpatient Hospital Stay
Admit: 2019-02-06 | Discharge: 2019-02-08 | Disposition: A | Discharge status: Home Health | Payer: MEDICARE | Admitting: Internal Medicine

## 2019-02-06 ENCOUNTER — Encounter: Attending: Hematology & Oncology | Primary: Family Medicine

## 2019-02-06 DIAGNOSIS — R6889 Other general symptoms and signs: Secondary | ICD-10-CM

## 2019-02-06 LAB — CBC WITH AUTO DIFFERENTIAL
Absolute Baso #: 0.1 10*3/uL (ref 0.0–0.2)
Absolute Eos #: 0.2 10*3/uL (ref 0.0–0.5)
Absolute Lymph #: 1.2 10*3/uL (ref 1.0–4.3)
Absolute Mono #: 1.7 10*3/uL — ABNORMAL HIGH (ref 0.0–0.8)
Absolute Neut #: 11.8 10*3/uL — ABNORMAL HIGH (ref 1.8–7.0)
Basophils: 0.6 % (ref 0.0–2.0)
Eosinophils: 1.1 % (ref 1.0–6.0)
Granulocytes %: 78.7 % (ref 40.0–80.0)
Hematocrit: 40.2 % (ref 35.0–47.0)
Hemoglobin: 13.4 g/dL (ref 11.7–16.0)
Lymphocyte %: 8 % — ABNORMAL LOW (ref 20.0–40.0)
MCH: 33.7 pg (ref 26.0–34.0)
MCHC: 33.4 % (ref 32.0–36.0)
MCV: 100.9 fL — ABNORMAL HIGH (ref 79.0–98.0)
MPV: 8.8 fL (ref 7.4–10.4)
Monocytes: 11.6 % — ABNORMAL HIGH (ref 2.0–10.0)
Platelets: 225 10*3/uL (ref 140–440)
RBC: 3.99 10*6/uL (ref 3.80–5.20)
RDW: 13.7 % (ref 11.5–14.5)
WBC: 15 10*3/uL — ABNORMAL HIGH (ref 3.6–10.7)

## 2019-02-06 LAB — URINALYSIS
Bilirubin Urine: NEGATIVE mg/dL
Glucose, Ur: NORMAL mg/dL (ref <70)
Ketones, Urine: NEGATIVE mg/dL
LEUKOCYTES, UA: NEGATIVE Leu/uL
Nitrite, Urine: NEGATIVE NA
Occult Blood,Urine: NEGATIVE mg/dL
Specific Gravity, Urine: 1.018 NA (ref 1.005–1.030)
Total Protein, Urine: NEGATIVE mg/dL
Urobilinogen, Urine: NORMAL mg/dL (ref 0–1)
pH, Urine: 7.5 NA (ref 5.0–8.0)

## 2019-02-06 LAB — LACTATE, SEPSIS
Lactic Acid: 1.4 mmol/L (ref 0.7–2.0)
Lactic Acid: 0.9 mmol/L (ref 0.7–2.0)

## 2019-02-06 LAB — COMPREHENSIVE METABOLIC PANEL
ALT: 26 U/L (ref 0–34)
AST: 48 U/L — ABNORMAL HIGH (ref 15–46)
Albumin,Serum: 3.9 g/dL (ref 3.5–5.0)
Alkaline Phosphatase: 69 U/L (ref 38–126)
Anion Gap: 9 NA
BUN: 18 mg/dL (ref 7–20)
CO2: 28 mmol/L (ref 22–30)
Calcium: 8.9 mg/dL (ref 8.4–10.4)
Chloride: 100 mmol/L (ref 98–107)
Creatinine: 1.04 mg/dL (ref 0.52–1.25)
EGFR IF NonAfrican American: 54.3 mL/min — AB (ref >60)
Glucose: 83 mg/dL (ref 70–100)
Potassium: 4.2 mmol/L (ref 3.5–5.1)
Sodium: 137 mmol/L (ref 135–145)
Total Bilirubin: 0.4 mg/dL (ref 0.2–1.3)
Total Protein: 7 g/dL (ref 6.3–8.2)
eGFR African American: 62.9 mL/min (ref >60)

## 2019-02-06 LAB — RESPIRATORY VIRUS PCR PANEL: Respiratory Panel PCR: NEGATIVE

## 2019-02-06 LAB — LIPASE: Lipase: 46 U/L (ref 23–300)

## 2019-02-06 LAB — TROPONIN: Troponin I: <0.012 ng/mL (ref 0.000–0.034)

## 2019-02-06 MED ORDER — SODIUM CHLORIDE 0.9% BOLUS (FLUID RESUSCITATION)
0.9 % | Freq: Once | INTRAVENOUS | Status: AC
Start: 2019-02-06 — End: 2019-02-06
  Administered 2019-02-06: 16:00:00 1497 mL/kg via INTRAVENOUS

## 2019-02-06 MED ORDER — NORMAL SALINE FLUSH 0.9 % IV SOLN
0.9 | Freq: Two times a day (BID) | INTRAVENOUS | Status: DC
Start: 2019-02-06 — End: 2019-02-08
  Administered 2019-02-06: 17:00:00 10 mL via INTRAVENOUS

## 2019-02-06 MED ORDER — PIPERACILLIN-TAZOBACTAM IN DEX 4-0.5 GM/100ML IV SOLN
4-0.5100 GM per 100ML | Freq: Once | INTRAVENOUS | Status: AC
Start: 2019-02-06 — End: 2019-02-06
  Administered 2019-02-06: 17:00:00 4.5 g via INTRAVENOUS

## 2019-02-06 MED ORDER — ACETAMINOPHEN 500 MG PO TABS
500 MG | Freq: Once | ORAL | Status: AC
Start: 2019-02-06 — End: 2019-02-06
  Administered 2019-02-06: 17:00:00 1000 mg via ORAL

## 2019-02-06 MED ORDER — MORPHINE SULFATE 10 MG/ML IJ SOLN
10 MG/ML | Freq: Once | INTRAMUSCULAR | Status: AC
Start: 2019-02-06 — End: 2019-02-06
  Administered 2019-02-06: 17:00:00 2 mg via INTRAVENOUS

## 2019-02-06 MED ORDER — NORMAL SALINE FLUSH 0.9 % IV SOLN
0.9 | INTRAVENOUS | Status: DC | PRN
Start: 2019-02-06 — End: 2019-02-08

## 2019-02-06 MED ORDER — NORMAL SALINE FLUSH 0.9 % IV SOLN
0.9 | Freq: Three times a day (TID) | INTRAVENOUS | Status: DC
Start: 2019-02-06 — End: 2019-02-08
  Administered 2019-02-07: 11:00:00 3 mL via INTRAVENOUS

## 2019-02-06 MED FILL — MAPAP 500 MG PO TABS: 500 mg | ORAL | Qty: 2

## 2019-02-06 MED FILL — MORPHINE SULFATE 10 MG/ML IJ SOLN: 10 mg/mL | INTRAMUSCULAR | Qty: 1

## 2019-02-06 MED FILL — ZOSYN 4-0.5 GM/100ML IV SOLN: 4-0.5 GM/100ML | INTRAVENOUS | Qty: 100

## 2019-02-06 NOTE — ED Notes (Signed)
Repeat lactic drawn labeled and sent to cc lab on ice      Loistine Simas, RN  02/06/19 1156

## 2019-02-06 NOTE — ED Notes (Signed)
Provider informed of positive geri screen      Loistine Simas, RN  02/06/19 1002

## 2019-02-06 NOTE — ED Triage Notes (Signed)
Pt placed on heart monitor and ekg paged to bedside. PT able to speak in full sentences without distress, pt is pleasant and cooperative. Given warm blankets.

## 2019-02-06 NOTE — ED Notes (Signed)
Pt placed on bedpan - approxiamtely of dark clear yellow urine noted - sample  Collected and labeled and sent to cc lab. Pt repositioned in bed and given warm blankets. Safety measures maintained.      Loistine Simas, RN  02/06/19 1251

## 2019-02-06 NOTE — ED Notes (Signed)
Bed: 26  Expected date:   Expected time:   Means of arrival:   Comments:  EMS fever     Ronney Lion, RN  02/06/19 [phone]

## 2019-02-06 NOTE — Other (Signed)
Culture reviewed. Awaiting sensitivities.

## 2019-02-06 NOTE — ED Provider Notes (Signed)
This is Dr. Jeanella Cara supervisory note. I saw the patient with the PA/NP/resident and found:  This patient looks well but she is here with fever, tachycardia, cough, some shortness of breath.  Symptoms started yesterday.  On examination the lungs are clear, heart tones normal, no pitting edema noted, no JVD.  Has had a prior mastectomy on the right.  Plan is sepsis workup, consider flu, COVID, pneumonia, etc.  Labs ordered, will get x-ray electrocardiogram and 30/kg of fluids, consider initial antibiotics pending initial findings.    Electrocardiogram per my interpretation shows sinus tachycardia at a rate of 130 with no sign of acute ischemia, possible atrial enlargement, morphology similar to prior     Marcy Siren, MD  02/06/19 1020

## 2019-02-06 NOTE — Other (Signed)
waiting for sensitivities
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
## 2019-02-06 NOTE — ED Notes (Signed)
Geriatric Risk Stratification Tool   To be completed for patients 75 years and older  Name: Dawn Padilla  DOB: 1950/08/21  MRN:  [id number]  Patient Age:  68 y.o.  Date:  02/06/2019        Gender: female    The Following Questions were answered by:          [x]  Patient     []  Family    []  Caregiver          []  Other:  ____________________________________       []  Unable to complete screening due to patient condition.   []  Patient is from a Nursing Home     Yes No   1. Do you have problems with your memory that affect your day to day activities or that you or your family notice most days?    Does your loved one have problems with his/her memory that affect his/her day to day activities?    []     2 points  [x]    0 points   2. Do you live alone AND/OR Do you need more help at home that you have now?    Does your loved one live alone and/or need more help than currently available?     []    1 points  [x]    0 points   3. Have you been in the hospital in the last 3 months?    Has your loved one been in the hospital in the last 3 months?    [x]    1 points  []    0 points   4. Do you have trouble with your walking or balance? Or Have you fallen recently?    Does your loved one have trouble with walking or balance? Has he/she fallen recently?    [x]    1 points  []    0 point   5. Do you take more than 5 medications or vitamins every day?    Does your loved one take more than 5 medications or vitamins every day?    [x]    1 points  []    0 points   6. When compared to others your own age, are you in worse health?    When compared to others of similar age, is your loved one in worse health?    []    1 points  [x]    0 points       Please add the yes answers with corresponding point totals for an overall score.      If patient has a total Score of 3 or more. Notify Physician for Required Referral order.    Total Score:  3 Total Points        Loistine Simas, RN  02/06/19 1002

## 2019-02-06 NOTE — Other (Signed)
no further treatment

## 2019-02-06 NOTE — ED Notes (Signed)
Sepsis team called      Loistine Simas, RN  02/06/19 1003

## 2019-02-06 NOTE — ED Notes (Signed)
Patient c/o chest pain, EKG to bedside and MD aware     Joyce Copa, RN  02/06/19 2129

## 2019-02-06 NOTE — ED Notes (Signed)
Jason Trauma RN in to attempt IV [place] on pt - this rn and other rn attempted x2 without success of blood draw or IV access.      Loistine Simas, RN  02/06/19 1015

## 2019-02-07 LAB — BASIC METABOLIC PANEL W/ REFLEX TO MG FOR LOW K
Anion Gap: 9 NA
BUN: 15 mg/dL (ref 7–20)
CO2: 24 mmol/L (ref 22–30)
Calcium: 8.1 mg/dL — ABNORMAL LOW (ref 8.4–10.4)
Chloride: 103 mmol/L (ref 98–107)
Creatinine: 1.02 mg/dL (ref 0.52–1.25)
EGFR IF NonAfrican American: 55.6 mL/min — AB (ref >60)
Glucose: 77 mg/dL (ref 70–100)
Potassium: 4.3 mmol/L (ref 3.5–5.1)
Sodium: 136 mmol/L (ref 135–145)
eGFR African American: 64.4 mL/min (ref >60)

## 2019-02-07 LAB — CBC
Hematocrit: 39.9 % (ref 35.0–47.0)
Hemoglobin: 13.3 g/dL (ref 11.7–16.0)
MCH: 33.8 pg (ref 26.0–34.0)
MCHC: 33.3 % (ref 32.0–36.0)
MCV: 101.4 fL — ABNORMAL HIGH (ref 79.0–98.0)
MPV: 7.1 fL — ABNORMAL LOW (ref 7.4–10.4)
Platelets: 212 10*3/uL (ref 140–440)
RBC: 3.94 10*6/uL (ref 3.80–5.20)
RDW: 13.8 % (ref 11.5–14.5)
WBC: 7.6 10*3/uL (ref 3.6–10.7)

## 2019-02-07 LAB — D-DIMER, QUANTITATIVE: D-Dimer, Quant: 0.38 mg/L (ref 0.00–0.50)

## 2019-02-07 LAB — C-REACTIVE PROTEIN: CRP: 21.3 mg/L — ABNORMAL HIGH (ref 0.0–6.0)

## 2019-02-07 LAB — PROCALCITONIN: Procalcitonin: <0.1 ng/mL (ref <0.10)

## 2019-02-07 LAB — ADD ON LAB TEST

## 2019-02-07 LAB — COVID-19: SARS-CoV-2: DETECTED — AB

## 2019-02-07 LAB — LACTATE DEHYDROGENASE: LD: 223 U/L (ref 120–246)

## 2019-02-07 LAB — FERRITIN: Ferritin: 121 ng/mL (ref 8–252)

## 2019-02-07 MED ORDER — BENZTROPINE MESYLATE 0.5 MG PO TABS
0.5 MG | Freq: Every evening | ORAL | Status: DC
Start: 2019-02-07 — End: 2019-02-08
  Administered 2019-02-08: 01:00:00 0.5 mg via ORAL

## 2019-02-07 MED ORDER — LISINOPRIL 20 MG PO TABS
20 MG | Freq: Every day | ORAL | Status: DC
Start: 2019-02-07 — End: 2019-02-08
  Administered 2019-02-07 – 2019-02-08 (×2): 40 mg via ORAL

## 2019-02-07 MED ORDER — CALCIUM-VITAMIN D 500-200 MG-UNIT PO TABS
500-200 MG-UNIT | Freq: Every day | ORAL | Status: DC
Start: 2019-02-07 — End: 2019-02-08
  Administered 2019-02-07 – 2019-02-08 (×2): 1 via ORAL

## 2019-02-07 MED ORDER — VITAMIN D 50 MCG (2000 UT) PO TABS
50 MCG (2000 UT) | Freq: Every day | ORAL | Status: DC
Start: 2019-02-07 — End: 2019-02-08
  Administered 2019-02-07 – 2019-02-08 (×2): 5000 [IU] via ORAL

## 2019-02-07 MED ORDER — ALENDRONATE SODIUM 70 MG PO TABS
70 MG | ORAL | Status: DC
Start: 2019-02-07 — End: 2019-02-07

## 2019-02-07 MED ORDER — NORMAL SALINE FLUSH 0.9 % IV SOLN
0.9 % | Freq: Two times a day (BID) | INTRAVENOUS | Status: DC
Start: 2019-02-07 — End: 2019-02-08
  Administered 2019-02-07 – 2019-02-08 (×3): 10 mL via INTRAVENOUS

## 2019-02-07 MED ORDER — ONDANSETRON HCL 4 MG/2ML IJ SOLN
4 MG/2ML | Freq: Once | INTRAMUSCULAR | Status: AC
Start: 2019-02-07 — End: 2019-02-07
  Administered 2019-02-07: 07:00:00 4 mg via INTRAVENOUS

## 2019-02-07 MED ORDER — CYCLOBENZAPRINE HCL 10 MG PO TABS
10 MG | Freq: Every evening | ORAL | Status: DC
Start: 2019-02-07 — End: 2019-02-08
  Administered 2019-02-08: 01:00:00 10 mg via ORAL

## 2019-02-07 MED ORDER — DEXAMETHASONE SODIUM PHOSPHATE 4 MG/ML IJ SOLN
4 MG/ML | Freq: Every day | INTRAMUSCULAR | Status: DC
Start: 2019-02-07 — End: 2019-02-07
  Administered 2019-02-07: 11:00:00 6 mg via INTRAVENOUS

## 2019-02-07 MED ORDER — PROMETHAZINE HCL 25 MG/ML IJ SOLN
25 MG/ML | Freq: Four times a day (QID) | INTRAMUSCULAR | Status: DC | PRN
Start: 2019-02-07 — End: 2019-02-08

## 2019-02-07 MED ORDER — ONDANSETRON HCL 4 MG/2ML IJ SOLN
4 MG/2ML | Freq: Four times a day (QID) | INTRAMUSCULAR | Status: DC | PRN
Start: 2019-02-07 — End: 2019-02-08

## 2019-02-07 MED ORDER — BENZONATATE 100 MG PO CAPS
100 MG | Freq: Three times a day (TID) | ORAL | Status: DC | PRN
Start: 2019-02-07 — End: 2019-02-08
  Administered 2019-02-07 – 2019-02-08 (×2): 100 mg via ORAL

## 2019-02-07 MED ORDER — PREDNISONE 10 MG PO TABS
10 MG | Freq: Every day | ORAL | Status: DC
Start: 2019-02-07 — End: 2019-02-08
  Administered 2019-02-08: 13:00:00 10 mg via ORAL

## 2019-02-07 MED ORDER — GABAPENTIN 100 MG PO CAPS
100 MG | Freq: Every evening | ORAL | Status: DC
Start: 2019-02-07 — End: 2019-02-08
  Administered 2019-02-08: 01:00:00 100 mg via ORAL

## 2019-02-07 MED ORDER — MELATONIN 3 MG PO TABS
3 MG | Freq: Every evening | ORAL | Status: DC | PRN
Start: 2019-02-07 — End: 2019-02-08

## 2019-02-07 MED ORDER — HYDRALAZINE HCL 50 MG PO TABS
50 MG | Freq: Two times a day (BID) | ORAL | Status: DC
Start: 2019-02-07 — End: 2019-02-08
  Administered 2019-02-07 – 2019-02-08 (×3): 25 mg via ORAL

## 2019-02-07 MED ORDER — ATORVASTATIN CALCIUM 20 MG PO TABS
20 MG | Freq: Every evening | ORAL | Status: DC
Start: 2019-02-07 — End: 2019-02-08
  Administered 2019-02-08: 01:00:00 20 mg via ORAL

## 2019-02-07 MED ORDER — ACETAMINOPHEN 500 MG PO TABS
500 MG | Freq: Four times a day (QID) | ORAL | Status: DC | PRN
Start: 2019-02-07 — End: 2019-02-08
  Administered 2019-02-07 – 2019-02-08 (×2): 1000 mg via ORAL

## 2019-02-07 MED ORDER — LOPERAMIDE HCL 2 MG PO CAPS
2 MG | ORAL | Status: DC | PRN
Start: 2019-02-07 — End: 2019-02-08

## 2019-02-07 MED ORDER — NORMAL SALINE FLUSH 0.9 % IV SOLN
0.9 % | INTRAVENOUS | Status: DC | PRN
Start: 2019-02-07 — End: 2019-02-08

## 2019-02-07 MED ORDER — PREDNISONE 10 MG PO TABS
10 MG | Freq: Every day | ORAL | Status: DC
Start: 2019-02-07 — End: 2019-02-07

## 2019-02-07 MED ORDER — CEPHALEXIN 250 MG PO CAPS
250 MG | Freq: Two times a day (BID) | ORAL | Status: DC
Start: 2019-02-07 — End: 2019-02-08
  Administered 2019-02-07 – 2019-02-08 (×3): 250 mg via ORAL

## 2019-02-07 MED ORDER — LABETALOL HCL 5 MG/ML IV SOLN
5 MG/ML | INTRAVENOUS | Status: DC | PRN
Start: 2019-02-07 — End: 2019-02-08

## 2019-02-07 MED ORDER — ACETAMINOPHEN 500 MG PO TABS
500 MG | Freq: Once | ORAL | Status: AC
Start: 2019-02-07 — End: 2019-02-06
  Administered 2019-02-07: 03:00:00 1000 mg via ORAL

## 2019-02-07 MED ORDER — ACETAMINOPHEN 650 MG RE SUPP
650 MG | Freq: Four times a day (QID) | RECTAL | Status: DC | PRN
Start: 2019-02-07 — End: 2019-02-08

## 2019-02-07 MED ORDER — ACETAMINOPHEN 325 MG PO TABS
325 MG | Freq: Four times a day (QID) | ORAL | Status: DC | PRN
Start: 2019-02-07 — End: 2019-02-07

## 2019-02-07 MED ORDER — TAMOXIFEN CITRATE 10 MG PO TABS
10 MG | Freq: Every day | ORAL | Status: DC
Start: 2019-02-07 — End: 2019-02-08
  Administered 2019-02-08 (×2): 20 mg via ORAL

## 2019-02-07 MED ORDER — POLYETHYLENE GLYCOL 3350 17 G PO PACK
17 g | Freq: Every day | ORAL | Status: DC | PRN
Start: 2019-02-07 — End: 2019-02-08

## 2019-02-07 MED ORDER — PROMETHAZINE HCL 25 MG PO TABS
25 MG | Freq: Four times a day (QID) | ORAL | Status: DC | PRN
Start: 2019-02-07 — End: 2019-02-08

## 2019-02-07 MED ORDER — METOPROLOL TARTRATE 100 MG PO TABS
100 MG | Freq: Every day | ORAL | Status: DC
Start: 2019-02-07 — End: 2019-02-08
  Administered 2019-02-07 – 2019-02-08 (×2): 100 mg via ORAL

## 2019-02-07 MED ORDER — AMLODIPINE BESYLATE 5 MG PO TABS
5 MG | Freq: Every day | ORAL | Status: DC
Start: 2019-02-07 — End: 2019-02-08
  Administered 2019-02-07 – 2019-02-08 (×2): 5 mg via ORAL

## 2019-02-07 MED ORDER — HYDROCODONE-ACETAMINOPHEN 5-325 MG PO TABS
5-325 MG | Freq: Once | ORAL | Status: AC
Start: 2019-02-07 — End: 2019-02-07
  Administered 2019-02-07: 07:00:00 1 via ORAL

## 2019-02-07 MED ORDER — ENOXAPARIN SODIUM 40 MG/0.4ML SC SOLN
40 | Freq: Every day | SUBCUTANEOUS | Status: DC
Start: 2019-02-07 — End: 2019-02-08
  Administered 2019-02-07 – 2019-02-08 (×2): 40 mg via SUBCUTANEOUS

## 2019-02-07 MED ORDER — ONDANSETRON 4 MG PO TBDP
4 MG | Freq: Three times a day (TID) | ORAL | Status: DC | PRN
Start: 2019-02-07 — End: 2019-02-08

## 2019-02-07 MED ORDER — ALBUTEROL SULFATE (2.5 MG/3ML) 0.083% IN NEBU
RESPIRATORY_TRACT | Status: DC | PRN
Start: 2019-02-07 — End: 2019-02-08

## 2019-02-07 MED FILL — HYDRALAZINE HCL 50 MG PO TABS: 50 mg | ORAL | Qty: 1

## 2019-02-07 MED FILL — CEPHALEXIN 250 MG PO CAPS: 250 mg | ORAL | Qty: 1

## 2019-02-07 MED FILL — ONDANSETRON 4 MG PO TBDP: 4 mg | ORAL | Qty: 1

## 2019-02-07 MED FILL — PREDNISONE 10 MG PO TABS: 10 mg | ORAL | Qty: 1

## 2019-02-07 MED FILL — OYSTER SHELL CALCIUM/D 500-5 MG-MCG PO TABS: 500-5 | ORAL | Qty: 1

## 2019-02-07 MED FILL — HYDROCODONE-ACETAMINOPHEN 5-325 MG PO TABS: 5-325 mg | ORAL | Qty: 1

## 2019-02-07 MED FILL — DEXAMETHASONE SODIUM PHOSPHATE 4 MG/ML IJ SOLN: 4 mg/mL | INTRAMUSCULAR | Qty: 2

## 2019-02-07 MED FILL — CYCLOBENZAPRINE HCL 10 MG PO TABS: 10 mg | ORAL | Qty: 1

## 2019-02-07 MED FILL — AMLODIPINE BESYLATE 5 MG PO TABS: 5 mg | ORAL | Qty: 1

## 2019-02-07 MED FILL — TAMOXIFEN CITRATE 10 MG PO TABS: 10 mg | ORAL | Qty: 2

## 2019-02-07 MED FILL — VITAMIN D3 50 MCG (2000 UT) PO TABS: 50 MCG (2000 UT) | ORAL | Qty: 3

## 2019-02-07 MED FILL — MAPAP 500 MG PO TABS: 500 mg | ORAL | Qty: 2

## 2019-02-07 MED FILL — BENZTROPINE MESYLATE 0.5 MG PO TABS: 0.5 mg | ORAL | Qty: 1

## 2019-02-07 MED FILL — ONDANSETRON HCL 4 MG/2ML IJ SOLN: 4 MG/2ML | INTRAMUSCULAR | Qty: 2

## 2019-02-07 MED FILL — LISINOPRIL 20 MG PO TABS: 20 mg | ORAL | Qty: 2

## 2019-02-07 MED FILL — METOPROLOL TARTRATE 100 MG PO TABS: 100 mg | ORAL | Qty: 1

## 2019-02-07 MED FILL — ENOXAPARIN SODIUM 40 MG/0.4ML SC SOLN: 40 MG/0.4ML | SUBCUTANEOUS | Qty: 0.4

## 2019-02-07 NOTE — Care Coordination-Inpatient (Signed)
Discharge Planning  Current Residence: Private Residence  Living Arrangements: Family Members(Granddaughter)  Support Systems: Family Members  Current Services Prior To Admission: Emergency Call  System, ESP/Passport, Meals On Wheels(DH CM Ulice Dash ?   HHA through [facility])  Potential Assistance Needed: Home Care  Potential Assistance Purchasing Medications: No  Meds-to-Beds: Does the patient want to have any new prescriptions delivered to bedside prior to discharge?: Yes([facility] doc-u-dose if meds to beds not available)  Type of Home Care Services: [facility]  Patient expects to be discharged to:: home with [facility]  Expected Discharge Date: 02/09/19  Follow Up Appointment: Best Day/Time : Monday PM      Emergency Contacts  Contact 1: Name: Janett Billow  Contact 1: Number: [phone]  Contact 1: Relationship: grandaughter  Contact 2: Name: Celene Skeen 2: Number: [phone]  Contact 2: Relationship: grandson    Social/Functional History  Lives With: Family  Type of Home: House  Home Layout: Two level, Able to Live on Main level with bedroom/bathroom  Home Access: Stairs to enter with rails  Entrance Stairs - Number of Steps: 2  Bathroom Shower/Tub: Administrator, Civil Service: Soil scientist: Grab bars in shower, Scientist, physiological Accessibility: Accessible  Home Equipment: Wellsite geologist, [place] Help From: Family, Home health  ADL Assistance: Needs assistance  Bath: Minimal assistance  Dressing: Minimal assistance  Homemaking Assistance: (HHA and family manage)  Homemaking Responsibilities: No  Ambulation Assistance: Artist)  Transfer Assistance: Independent  Active Driver: Yes  Occupation: Retired      68 y/o female admitted on Tachycardia after initially presenting with SOB, H/A, fever, and cough. Positive COVID. Received IV Zosyn X1. PT pending. Spoke with pt via telephone due to isolation status, introduced self and explained role. PTA lived home, her  granddaughter lives with her. Min assist with ADL's. Active with DH. CM is Jay-did not know last name. Unable to verify services due to [facility] closed over the weekend. Per pt has a HHA 3X week for 3hts through [facility]. Was recently discharged from skilled home care and was set up to start outpt therapy. Has insurance with prescription coverage. Has not been in a facility in the last 14 days. Pt interested in [facility] at discharge as will not be able to go to outpt with COVID-referral made to [facility]. Will need to await therapy recommendations to determine if will have other [place] needs. Electronically signed by Elisabeth Cara, RN on 02/07/19 at 10:43 AM EST

## 2019-02-07 NOTE — ED Notes (Signed)
N-95 and appropriate PPE worn during entire pt encounter per [facility] Covid 19 policy. Pt taken to 4W 434 on tele monitor by this RN with pt's chart and all pt belongings. Pt wearing mask for transfer. No acute distress upon departure from ED.      Cheri Rous, RN  02/07/19 [phone]

## 2019-02-07 NOTE — ED Notes (Signed)
Patient medicated for pain and nausea per MD order     Joyce Copa, RN  02/07/19 0200
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
## 2019-02-07 NOTE — Progress Notes (Signed)
Kadajah Wilbourn Ruegg was ordered  Alendronate 70 mg once weekly. Oral bisphosphonates have been shown to increase the risk of serious GI events if the strict administration instructions are not followed. Because it is often difficult to follow these strict measures in hospitalized patients, the [employer] Committee has determined the risk of administration to hospitalized patients outweighs the potential benefit.  Therefore, all orders for oral bisphosphonate are automatically discontinued per [place] [id number].     The medication remains on the Home Medication List for resumption at discharge unless specifically discontinued by the prescriber.      Nance Pear, PharmD

## 2019-02-07 NOTE — IP Home Care (Signed)
Home care liaison following case peripherally for discharge needs.  Electronically signed by Milas Kocher, LPN on [phone] at 579FGE AM

## 2019-02-08 LAB — LACTATE DEHYDROGENASE: LD: 284 U/L — ABNORMAL HIGH (ref 120–246)

## 2019-02-08 LAB — FERRITIN: Ferritin: 238 ng/mL (ref 8–252)

## 2019-02-08 LAB — CBC
Hematocrit: 41.8 % (ref 35.0–47.0)
Hemoglobin: 14 g/dL (ref 11.7–16.0)
MCH: 33.8 pg (ref 26.0–34.0)
MCHC: 33.4 % (ref 32.0–36.0)
MCV: 101.2 fL — ABNORMAL HIGH (ref 79.0–98.0)
MPV: 8.6 fL (ref 7.4–10.4)
Platelets: 244 10*3/uL (ref 140–440)
RBC: 4.14 10*6/uL (ref 3.80–5.20)
RDW: 13.5 % (ref 11.5–14.5)
WBC: 14.8 10*3/uL — ABNORMAL HIGH (ref 3.6–10.7)

## 2019-02-08 LAB — BASIC METABOLIC PANEL
Anion Gap: 10 NA
BUN: 19 mg/dL (ref 7–20)
CO2: 26 mmol/L (ref 22–30)
Calcium: 9 mg/dL (ref 8.4–10.4)
Chloride: 103 mmol/L (ref 98–107)
Creatinine: 0.98 mg/dL (ref 0.52–1.25)
EGFR IF NonAfrican American: 58.3 mL/min — AB (ref >60)
Glucose: 105 mg/dL — ABNORMAL HIGH (ref 70–100)
Potassium: 4.2 mmol/L (ref 3.5–5.1)
Sodium: 138 mmol/L (ref 135–145)
eGFR African American: 67.6 mL/min (ref >60)

## 2019-02-08 LAB — D-DIMER, QUANTITATIVE: D-Dimer, Quant: 0.69 mg/L — ABNORMAL HIGH (ref 0.00–0.50)

## 2019-02-08 LAB — C-REACTIVE PROTEIN: CRP: 14.4 mg/L — ABNORMAL HIGH (ref 0.0–6.0)

## 2019-02-08 MED FILL — MAPAP 500 MG PO TABS: 500 mg | ORAL | Qty: 2

## 2019-02-08 MED FILL — BENZONATATE 100 MG PO CAPS: 100 mg | ORAL | Qty: 1

## 2019-02-08 MED FILL — AMLODIPINE BESYLATE 5 MG PO TABS: 5 mg | ORAL | Qty: 1

## 2019-02-08 MED FILL — ATORVASTATIN CALCIUM 20 MG PO TABS: 20 mg | ORAL | Qty: 1

## 2019-02-08 MED FILL — METOPROLOL TARTRATE 100 MG PO TABS: 100 mg | ORAL | Qty: 1

## 2019-02-08 MED FILL — HYDRALAZINE HCL 50 MG PO TABS: 50 mg | ORAL | Qty: 1

## 2019-02-08 MED FILL — VITAMIN D3 50 MCG (2000 UT) PO TABS: 50 MCG (2000 UT) | ORAL | Qty: 3

## 2019-02-08 MED FILL — LISINOPRIL 20 MG PO TABS: 20 mg | ORAL | Qty: 2

## 2019-02-08 MED FILL — GABAPENTIN 100 MG PO CAPS: 100 mg | ORAL | Qty: 1

## 2019-02-08 MED FILL — ENOXAPARIN SODIUM 40 MG/0.4ML SC SOLN: 40 MG/0.4ML | SUBCUTANEOUS | Qty: 0.4

## 2019-02-08 NOTE — Other (Signed)
IV and tele DCed. Paperwork reviewed. Pt dressed and waiting for transport.

## 2019-02-08 NOTE — IP Home Care (Signed)
Home care liaison following case peripherally for discharge needs. Pt is active with [facility] for HHA.  New orders for home care  faxed to [phone]  Electronically signed by Milas Kocher, LPN on 075-GRM at 624THL PM

## 2019-02-08 NOTE — Other (Unsigned)
Patient Acct Nbr: [id number]   Primary AUTH/CERT:   [place] Name: [place] name: [employer] Dual Complete  Primary Insurance Group Number: [employer]  Primary Insurance Plan Type: Health  Primary Insurance Policy Number: [id number]    Secondary AUTH/CERT:   [place] Name: [employer]  Secondary Insurance Plan name: [employer] IME  Secondary Insurance Group Number:   Secondary Insurance Plan Type: IME  Secondary Insurance Policy Number: [id number]    Tertiary AUTH/CERT:   [place] Name: [employer] Plan name: [facility] MyCrMcaid Second  [employer] Group Number: [employer] Insurance Plan Type: [employer] Number: [id number]

## 2019-02-08 NOTE — Telephone Encounter (Signed)
[facility] Discharge Summary with   Discharge DayProgress Note and Transition Note        Dawn Padilla  DOB: 1951-01-12  MRN:  [id number]    ADMIT DATE: 02/06/2019  DISCHARGE DATE:  02/08/2019    PRIMARYCARE PHYSICIAN:  America Brown, DO    VISIT STATUS: Admission    CODE STATUS:  Full Code    DISCHARGE DIAGNOSES:    Principal Problem:    Acute bronchitis due to COVID-19 virus  Active Problems:    Sinus tachycardia    Sepsis due to COVID-19 [facility])  Resolved Problems:    * No resolved hospital problems. *      HOSPITAL COURSE:  69 yr old F with PMHx breast cancer and asthma who presents to the ED with cough, fever, SOB and headache. Her symptoms started 11/5-11/6. Fever was as high as 100.4 at home.     In ED, she had a temp of 100.3 and HR 121. SpO2 was 98% on RA. Initial lab work was significant for WBC count 15, but otherwise unremarkable. CTA chest revealed no PE or pneumonia. SARS-CoV-2 PCR was obtained and she was admitted.     PCR subsequently returned positive. She did not require supplemental oxygen or COVID specific therapy. Her symptoms improved quickly and she was discharged back to home care. Due to her being early on in her symptoms, it was explained to her that she may get worse at home, and should return to the hospital if that happens.     Assessment:   1. Acute bronchitis 2/2 COVID-19  2. Sepsis 2/2 COVID-19 bronchitis, POA (HR >90, WBC 15.0)  3. Sinus tachycardia   4. Acute on chronic low back pain     DAY OF DISCHARGE:  Review of Systems   Constitutional: Negative for appetite change.   Respiratory: Negative for shortness of breath.    Musculoskeletal: Positive for back pain.   feels much better today   Feels stronger than yesterday     Patient Vitals for the past 24 hrs:   BP Temp Temp src Pulse Resp SpO2   02/08/19 0825 124/64 97.9 F (36.6 C) Temporal 115 18 98 %   02/08/19 0435 111/62 98.3 F (36.8 C) Temporal 87 18 96 %   02/08/19 0018 110/62 97.4 F (36.3 C) Oral 92 20  98 %   02/07/19 1943 (!) 105/53 98.1 F (36.7 C) Temporal 91 30 96 %   02/07/19 1448 (!) 112/57 97.5 F (36.4 C) Temporal 73 20 99 %       Average, Min, and Max forlast 24 hours Vitals:  TEMPERATURE:  Temp  Avg: 97.8 F (36.6 C)  Min: 97.4 F (36.3 C)  Max: 98.3 F (36.8 C)    RESPIRATIONS RANGE: Resp  Avg: 21.2  Min: 18  Max: 30    PULSE RANGE: Pulse  Avg: 91.6  Min: 73  Max: 115    BLOOD PRESSURE RANGE:  Systolic (123XX123), 99991111 , Min:105 , 123XX123   ; Diastolic (123XX123), 0000000, Min:53, Max:64      PULSE OXIMETRYRANGE: SpO2  Avg: 97.4 %  Min: 96 %  Max: 99 %    I/O last 3 completed shifts:  In: -   Out: 800 [Urine:800]    Physical Exam  Constitutional:       General: She is not in acute distress.     Appearance: She is not toxic-appearing or diaphoretic.      Comments: Thin, frail  Eyes:      General: No scleral icterus.     Conjunctiva/sclera: Conjunctivae normal.   Cardiovascular:      Rate and Rhythm: Normal rate and regular rhythm.      Heart sounds: Normal heart sounds. No murmur.   Pulmonary:      Effort: Pulmonary effort is normal. No respiratory distress.      Breath sounds: Examination of the right-lower field reveals decreased breath sounds. Examination of the left-lower field reveals decreased breath sounds. Decreased breath sounds present. No wheezing or rales.   Abdominal:      General: Bowel sounds are normal. There is no distension.      Palpations: Abdomen is soft.      Tenderness: There is no abdominal tenderness. There is no guarding.   Skin:     General: Skin is warm and dry.   Psychiatric:         Behavior: Behavior normal.         PROCEDURES:  none    CONSULTANTS:  none    DISCHARGE MEDICATIONS:        Significant Medication Changes: n/a     Lauree, Olszewski   Home Medication Instructions [id number]    Printed on:02/08/19 1210   Medication Information                      acetaminophen (TYLENOL) 500 MG tablet  Take 1,000 mg by mouth every 6 hours as needed for Pain              alendronate (FOSAMAX) 70 MG tablet  TAKE ONE TABLET BY MOUTH ONCE A WEEK ON MONDAY             amLODIPine (NORVASC) 5 MG tablet  Take 5 mg by mouth daily             atorvastatin (LIPITOR) 20 MG tablet  Take 20 mg by mouth daily             benztropine (COGENTIN) 0.5 MG tablet  Take 0.5 mg by mouth nightly              Breast Prosthesis MISC  by Does not apply route Bilateral mastectomies             Calcium Carb-Cholecalciferol (CALCIUM + D3) 600-200 MG-UNIT TABS  Take 1 tablet by mouth daily              cephALEXin (KEFLEX) 250 MG capsule  TAKE TWO CAPSULES BY MOUTH EVERY TWELVE HOURS             cyclobenzaprine (FLEXERIL) 10 MG tablet  Take 10 mg by mouth every evening              dicyclomine (BENTYL) 20 MG tablet  Take 20 mg by mouth 4 times daily as needed             fluticasone (FLONASE) 50 MCG/ACT nasal spray  INTILL ONE SPRAY IN EACH nostril ONCE A DAY Nasally             gabapentin (NEURONTIN) 100 MG capsule  Take 100 mg by mouth nightly as needed.              hydrALAZINE (APRESOLINE) 25 MG tablet  Take 25 mg by mouth every 12 hours              lisinopril (PRINIVIL;ZESTRIL) 40 MG tablet  Take 40 mg by mouth daily  loperamide (IMODIUM) 2 MG capsule  Take 2 mg by mouth as needed for Diarrhea             metoprolol (LOPRESSOR) 100 MG tablet  Take 100 mg by mouth daily              NATURAL VITAMIN D-3 125 MCG (5000 UT) TABS tablet  TAKE ONE TABLET BY MOUTH EVERY DAY             predniSONE (DELTASONE) 10 MG tablet  TAKE ONE TABLET BY MOUTH EVERY DAY             PROAIR HFA 108 (90 Base) MCG/ACT inhaler  INHALE TWO PUFFS EVERY 4 HOURS AS NEEDED             promethazine (PHENERGAN) 25 MG tablet  every 4 hours as needed              Surgical Bra MISC  by Does not apply route S/p bilateral mastectomies             tamoxifen (NOLVADEX) 20 MG tablet  Take 1 tablet by mouth daily                 DIET: regular    ACTIVITY: resume regular activity    SIGNIFICANT DIAGNOSTIC STUDIES:  COVID+    COMPLEXITY  OF FOLLOW UP:   [x]  Moderate Complexity: follow up within 7-14 calendar days [id number])   []  Severe Complexity: follow up within 7 calendar days [id number])    FOLLOW UP TESTING, PENDING RESULTS ORREFERRALS AT TRANSITIONAL CARE VISIT:    []   Yes    [x]   No    DISPOSITION:  Home care     Follow up with America Brown, DO to be scheduled by office    Notification (telephone encounter) to PCP initiated:   [x]   Yes    []   No    INSTRUCTIONS TO MA/SW: Please call patient on day after discharge (must document patient  contacted within 2 business days of discharge).    FOLLOW UP QUESTIONS FOR MA/SW:  1. Did you get medications filled and takingthem as instructed from discharge?  2. Are you following your discharge instructions from your hospital stay?  3. Please confirm patient is scheduled for a follow up appointment within the above time frame.

## 2019-02-08 NOTE — Discharge Summary (Signed)
[facility] Discharge Summary with   Discharge DayProgress Note and Transition Note        Dawn Padilla  DOB: 22-Aug-1950  MRN:  [id number]    ADMIT DATE: 02/06/2019  DISCHARGE DATE:  02/08/2019    PRIMARYCARE PHYSICIAN:  America Brown, DO    VISIT STATUS: Admission    CODE STATUS:  Full Code    DISCHARGE DIAGNOSES:    Principal Problem:    Acute bronchitis due to COVID-19 virus  Active Problems:    Sinus tachycardia    Sepsis due to COVID-19 [facility])  Resolved Problems:    * No resolved hospital problems. *      HOSPITAL COURSE:  68 yr old F with PMHx breast cancer and asthma who presents to the ED with cough, fever, SOB and headache. Her symptoms started 11/5-11/6. Fever was as high as 100.4 at home.   ??  In ED, she had a temp of 100.3 and HR 121. SpO2 was 98% on RA. Initial lab work was significant for WBC count 15, but otherwise unremarkable. CTA chest revealed no PE or pneumonia. SARS-CoV-2 PCR was obtained and she was admitted.   ??  PCR subsequently returned positive. She did not require supplemental oxygen or COVID specific therapy. Her symptoms improved quickly and she was discharged back to home care. Due to her being early on in her symptoms, it was explained to her that she may get worse at home, and should return to the hospital if that happens.     Assessment:   1. Acute bronchitis 2/2 COVID-19  2. Sepsis 2/2 COVID-19 bronchitis, POA (HR >90, WBC 15.0)  3. Sinus tachycardia   4. Acute on chronic low back pain     DAY OF DISCHARGE:  Review of Systems   Constitutional: Negative for appetite change.   Respiratory: Negative for shortness of breath.    Musculoskeletal: Positive for back pain.   feels much better today   Feels stronger than yesterday     Patient Vitals for the past 24 hrs:   BP Temp Temp src Pulse Resp SpO2   02/08/19 0825 124/64 97.9 ??F (36.6 ??C) Temporal 115 18 98 %   02/08/19 0435 111/62 98.3 ??F (36.8 ??C) Temporal 87 18 96 %   02/08/19 0018 110/62 97.4 ??F (36.3 ??C) Oral 92 20  98 %   02/07/19 1943 (!) 105/53 98.1 ??F (36.7 ??C) Temporal 91 30 96 %   02/07/19 1448 (!) 112/57 97.5 ??F (36.4 ??C) Temporal 73 20 99 %       Average, Min, and Max forlast 24 hours Vitals:  TEMPERATURE:  Temp  Avg: 97.8 ??F (36.6 ??C)  Min: 97.4 ??F (36.3 ??C)  Max: 98.3 ??F (36.8 ??C)    RESPIRATIONS RANGE: Resp  Avg: 21.2  Min: 18  Max: 30    PULSE RANGE: Pulse  Avg: 91.6  Min: 73  Max: 115    BLOOD PRESSURE RANGE:  Systolic (123XX123), 99991111 , Min:105 , 123XX123   ; Diastolic (123XX123), 0000000, Min:53, Max:64      PULSE OXIMETRYRANGE: SpO2  Avg: 97.4 %  Min: 96 %  Max: 99 %    I/O last 3 completed shifts:  In: -   Out: 800 [Urine:800]    Physical Exam  Constitutional:       General: She is not in acute distress.     Appearance: She is not toxic-appearing or diaphoretic.      Comments: Thin, frail  Eyes:      General: No scleral icterus.     Conjunctiva/sclera: Conjunctivae normal.   Cardiovascular:      Rate and Rhythm: Normal rate and regular rhythm.      Heart sounds: Normal heart sounds. No murmur.   Pulmonary:      Effort: Pulmonary effort is normal. No respiratory distress.      Breath sounds: Examination of the right-lower field reveals decreased breath sounds. Examination of the left-lower field reveals decreased breath sounds. Decreased breath sounds present. No wheezing or rales.   Abdominal:      General: Bowel sounds are normal. There is no distension.      Palpations: Abdomen is soft.      Tenderness: There is no abdominal tenderness. There is no guarding.   Skin:     General: Skin is warm and dry.   Psychiatric:         Behavior: Behavior normal.         PROCEDURES:  none    CONSULTANTS:  none    DISCHARGE MEDICATIONS:        Significant Medication Changes: n/a     Kaliyha, Zamarripa   Home Medication Instructions [id number]    Printed on:02/08/19 1210   Medication Information                      acetaminophen (TYLENOL) 500 MG tablet  Take 1,000 mg by mouth every 6 hours as needed for Pain              alendronate (FOSAMAX) 70 MG tablet  TAKE ONE TABLET BY MOUTH ONCE A WEEK ON MONDAY             amLODIPine (NORVASC) 5 MG tablet  Take 5 mg by mouth daily             atorvastatin (LIPITOR) 20 MG tablet  Take 20 mg by mouth daily             benztropine (COGENTIN) 0.5 MG tablet  Take 0.5 mg by mouth nightly              Breast Prosthesis MISC  by Does not apply route Bilateral mastectomies             Calcium Carb-Cholecalciferol (CALCIUM + D3) 600-200 MG-UNIT TABS  Take 1 tablet by mouth daily              cephALEXin (KEFLEX) 250 MG capsule  TAKE TWO CAPSULES BY MOUTH EVERY TWELVE HOURS             cyclobenzaprine (FLEXERIL) 10 MG tablet  Take 10 mg by mouth every evening              dicyclomine (BENTYL) 20 MG tablet  Take 20 mg by mouth 4 times daily as needed             fluticasone (FLONASE) 50 MCG/ACT nasal spray  INTILL ONE SPRAY IN EACH nostril ONCE A DAY Nasally             gabapentin (NEURONTIN) 100 MG capsule  Take 100 mg by mouth nightly as needed.              hydrALAZINE (APRESOLINE) 25 MG tablet  Take 25 mg by mouth every 12 hours              lisinopril (PRINIVIL;ZESTRIL) 40 MG tablet  Take 40 mg by mouth daily  loperamide (IMODIUM) 2 MG capsule  Take 2 mg by mouth as needed for Diarrhea             metoprolol (LOPRESSOR) 100 MG tablet  Take 100 mg by mouth daily              NATURAL VITAMIN D-3 125 MCG (5000 UT) TABS tablet  TAKE ONE TABLET BY MOUTH EVERY DAY             predniSONE (DELTASONE) 10 MG tablet  TAKE ONE TABLET BY MOUTH EVERY DAY             PROAIR HFA 108 (90 Base) MCG/ACT inhaler  INHALE TWO PUFFS EVERY 4 HOURS AS NEEDED             promethazine (PHENERGAN) 25 MG tablet  every 4 hours as needed              Surgical Bra MISC  by Does not apply route S/p bilateral mastectomies             tamoxifen (NOLVADEX) 20 MG tablet  Take 1 tablet by mouth daily                 DIET: regular    ACTIVITY: resume regular activity    SIGNIFICANT DIAGNOSTIC STUDIES:  COVID+    COMPLEXITY  OF FOLLOW UP:   [x]  Moderate Complexity: follow up within 7-14 calendar days [id number])   []  Severe Complexity: follow up within 7 calendar days [id number])    FOLLOW UP TESTING, PENDING RESULTS ORREFERRALS AT TRANSITIONAL CARE VISIT:    []   Yes    [x]   No    DISPOSITION:  Home care     Follow up with America Brown, DO to be scheduled by office    Notification (telephone encounter) to PCP initiated:   [x]   Yes    []   No    INSTRUCTIONS TO MA/SW: Please call patient on day after discharge (must document patient  contacted within 2 business days of discharge).    FOLLOW UP QUESTIONS FOR MA/SW:  1. Did you get medications filled and takingthem as instructed from discharge?  2. Are you following your discharge instructions from your hospital stay?  3. Please confirm patient is scheduled for a follow up appointment within the above time frame.    DISCHARGE TIME: > 30 minutes    *d/w  RN     Electronically signed by Antonieta Loveless, DO on 02/08/19 at 12:10 PM EST

## 2019-02-09 ENCOUNTER — Inpatient Hospital Stay
Admit: 2019-02-09 | Discharge: 2019-02-16 | Disposition: A | Discharge status: Home Health | Payer: PRIVATE HEALTH INSURANCE | Admitting: Internal Medicine

## 2019-02-09 DIAGNOSIS — R519 Headache, unspecified: Secondary | ICD-10-CM

## 2019-02-09 LAB — TROPONIN: Troponin I: 0.023 ng/mL (ref 0.000–0.034)

## 2019-02-09 MED ORDER — METOCLOPRAMIDE HCL 5 MG/ML IJ SOLN
5 MG/ML | Freq: Once | INTRAMUSCULAR | Status: DC
Start: 2019-02-09 — End: 2019-02-09

## 2019-02-09 MED ORDER — LOPERAMIDE HCL 2 MG PO CAPS
2 MG | ORAL | Status: DC | PRN
Start: 2019-02-09 — End: 2019-02-09

## 2019-02-09 MED ORDER — CYCLOBENZAPRINE HCL 10 MG PO TABS
10 MG | Freq: Every evening | ORAL | Status: DC
Start: 2019-02-09 — End: 2019-02-16
  Administered 2019-02-10 – 2019-02-16 (×6): 10 mg via ORAL

## 2019-02-09 MED ORDER — METOCLOPRAMIDE HCL 5 MG/ML IJ SOLN
5 MG/ML | Freq: Once | INTRAMUSCULAR | Status: AC
Start: 2019-02-09 — End: 2019-02-09
  Administered 2019-02-09: 15:00:00 10 mg via INTRAMUSCULAR

## 2019-02-09 MED ORDER — POTASSIUM CHLORIDE 20 MEQ PO PACK
20 MEQ | ORAL | Status: DC | PRN
Start: 2019-02-09 — End: 2019-02-13

## 2019-02-09 MED ORDER — DIPHENHYDRAMINE HCL 50 MG/ML IJ SOLN
50 MG/ML | Freq: Once | INTRAMUSCULAR | Status: DC
Start: 2019-02-09 — End: 2019-02-09

## 2019-02-09 MED ORDER — POTASSIUM CHLORIDE CRYS ER 10 MEQ PO TBCR
10 MEQ | ORAL | Status: DC | PRN
Start: 2019-02-09 — End: 2019-02-13

## 2019-02-09 MED ORDER — BENZTROPINE MESYLATE 0.5 MG PO TABS
0.5 MG | Freq: Every evening | ORAL | Status: DC
Start: 2019-02-09 — End: 2019-02-16
  Administered 2019-02-10 – 2019-02-16 (×7): 0.5 mg via ORAL

## 2019-02-09 MED ORDER — POTASSIUM CHLORIDE 10 MEQ/100ML IV SOLN
10 MEQ/0ML | INTRAVENOUS | Status: DC | PRN
Start: 2019-02-09 — End: 2019-02-13

## 2019-02-09 MED ORDER — PROMETHAZINE HCL 25 MG PO TABS
25 MG | Freq: Four times a day (QID) | ORAL | Status: DC | PRN
Start: 2019-02-09 — End: 2019-02-16

## 2019-02-09 MED ORDER — NORMAL SALINE FLUSH 0.9 % IV SOLN
0.9 % | Freq: Three times a day (TID) | INTRAVENOUS | Status: DC
Start: 2019-02-09 — End: 2019-02-16
  Administered 2019-02-09 – 2019-02-16 (×7): 3 mL via INTRAVENOUS

## 2019-02-09 MED ORDER — GABAPENTIN 100 MG PO CAPS
100 MG | Freq: Every evening | ORAL | Status: DC | PRN
Start: 2019-02-09 — End: 2019-02-16

## 2019-02-09 MED ORDER — LOPERAMIDE HCL 2 MG PO CAPS
2 MG | Freq: Four times a day (QID) | ORAL | Status: DC | PRN
Start: 2019-02-09 — End: 2019-02-16

## 2019-02-09 MED ORDER — DICYCLOMINE HCL 20 MG PO TABS
20 MG | Freq: Four times a day (QID) | ORAL | Status: DC | PRN
Start: 2019-02-09 — End: 2019-02-16
  Administered 2019-02-10: 01:00:00 20 mg via ORAL

## 2019-02-09 MED ORDER — ENOXAPARIN SODIUM 40 MG/0.4ML SC SOLN
40 MG/0.4ML | Freq: Every day | SUBCUTANEOUS | Status: DC
Start: 2019-02-09 — End: 2019-02-16
  Administered 2019-02-10 – 2019-02-16 (×8): 40 mg via SUBCUTANEOUS

## 2019-02-09 MED ORDER — ONDANSETRON 4 MG PO TBDP
4 MG | Freq: Once | ORAL | Status: AC
Start: 2019-02-09 — End: 2019-02-09

## 2019-02-09 MED ORDER — PREDNISONE 10 MG PO TABS
10 MG | Freq: Every day | ORAL | Status: DC
Start: 2019-02-09 — End: 2019-02-16
  Administered 2019-02-10 (×2): 10 mg via ORAL

## 2019-02-09 MED ORDER — DEXTROSE 5 % IV SOLN (MINI-BAG)
5 % | Freq: Once | INTRAVENOUS | Status: AC
Start: 2019-02-09 — End: 2019-02-09
  Administered 2019-02-09: 21:00:00 1 g via INTRAVENOUS

## 2019-02-09 MED ORDER — LACTATED RINGERS IV BOLUS
Freq: Once | INTRAVENOUS | Status: AC
Start: 2019-02-09 — End: 2019-02-09
  Administered 2019-02-09: 20:00:00 1000 mL via INTRAVENOUS

## 2019-02-09 MED ORDER — LISINOPRIL 20 MG PO TABS
20 MG | Freq: Every day | ORAL | Status: DC
Start: 2019-02-09 — End: 2019-02-16
  Administered 2019-02-10 (×2): 40 mg via ORAL

## 2019-02-09 MED ORDER — POLYETHYLENE GLYCOL 3350 17 G PO PACK
17 g | Freq: Every day | ORAL | Status: DC | PRN
Start: 2019-02-09 — End: 2019-02-16

## 2019-02-09 MED ORDER — FLUTICASONE PROPIONATE 50 MCG/ACT NA SUSP
50 MCG/ACT | Freq: Every day | NASAL | Status: DC
Start: 2019-02-09 — End: 2019-02-16
  Administered 2019-02-10 – 2019-02-16 (×8): 1 via NASAL

## 2019-02-09 MED ORDER — TAMOXIFEN CITRATE 10 MG PO TABS
10 MG | Freq: Every day | ORAL | Status: DC
Start: 2019-02-09 — End: 2019-02-16
  Administered 2019-02-10 – 2019-02-16 (×8): 20 mg via ORAL

## 2019-02-09 MED ORDER — LACTATED RINGERS IV BOLUS
Freq: Once | INTRAVENOUS | Status: AC
Start: 2019-02-09 — End: 2019-02-09
  Administered 2019-02-09: 16:00:00 1000 mL via INTRAVENOUS

## 2019-02-09 MED ORDER — HYDRALAZINE HCL 50 MG PO TABS
50 MG | Freq: Two times a day (BID) | ORAL | Status: DC
Start: 2019-02-09 — End: 2019-02-16
  Administered 2019-02-10 (×2): 25 mg via ORAL

## 2019-02-09 MED ORDER — DIPHENHYDRAMINE HCL 50 MG/ML IJ SOLN
50 MG/ML | Freq: Once | INTRAMUSCULAR | Status: AC
Start: 2019-02-09 — End: 2019-02-09
  Administered 2019-02-09: 15:00:00 25 mg via INTRAMUSCULAR

## 2019-02-09 MED ORDER — ONDANSETRON HCL 4 MG/2ML IJ SOLN
4 MG/2ML | Freq: Four times a day (QID) | INTRAMUSCULAR | Status: DC | PRN
Start: 2019-02-09 — End: 2019-02-16

## 2019-02-09 MED ORDER — AMLODIPINE BESYLATE 5 MG PO TABS
5 MG | Freq: Every day | ORAL | Status: DC
Start: 2019-02-09 — End: 2019-02-16
  Administered 2019-02-10 – 2019-02-16 (×8): 5 mg via ORAL

## 2019-02-09 MED ORDER — ACETAMINOPHEN 650 MG RE SUPP
650 MG | Freq: Four times a day (QID) | RECTAL | Status: DC | PRN
Start: 2019-02-09 — End: 2019-02-16

## 2019-02-09 MED ORDER — ALBUTEROL SULFATE HFA 108 (90 BASE) MCG/ACT IN AERS
108 (90 Base) MCG/ACT | RESPIRATORY_TRACT | Status: DC | PRN
Start: 2019-02-09 — End: 2019-02-16
  Administered 2019-02-13 – 2019-02-14 (×2): 2 via RESPIRATORY_TRACT

## 2019-02-09 MED ORDER — ACETAMINOPHEN 325 MG PO TABS
325 MG | Freq: Four times a day (QID) | ORAL | Status: DC | PRN
Start: 2019-02-09 — End: 2019-02-16
  Administered 2019-02-10 – 2019-02-16 (×6): 650 mg via ORAL

## 2019-02-09 MED ORDER — ACETAMINOPHEN 500 MG PO TABS
500 MG | Freq: Once | ORAL | Status: AC
Start: 2019-02-09 — End: 2019-02-09
  Administered 2019-02-09: 14:00:00 1000 mg via ORAL

## 2019-02-09 MED ORDER — ATORVASTATIN CALCIUM 20 MG PO TABS
20 MG | Freq: Every day | ORAL | Status: DC
Start: 2019-02-09 — End: 2019-02-16
  Administered 2019-02-10 – 2019-02-16 (×7): 20 mg via ORAL

## 2019-02-09 MED ORDER — ONDANSETRON 4 MG PO TBDP
4 MG | ORAL | Status: AC
Start: 2019-02-09 — End: 2019-02-09
  Administered 2019-02-09: 15:00:00 4 via ORAL

## 2019-02-09 MED ORDER — METOPROLOL TARTRATE 100 MG PO TABS
100 MG | Freq: Every day | ORAL | Status: DC
Start: 2019-02-09 — End: 2019-02-11
  Administered 2019-02-10 (×2): 100 mg via ORAL

## 2019-02-09 MED FILL — METOPROLOL TARTRATE 100 MG PO TABS: 100 mg | ORAL | Qty: 1

## 2019-02-09 MED FILL — HYDRALAZINE HCL 50 MG PO TABS: 50 mg | ORAL | Qty: 1

## 2019-02-09 MED FILL — DICYCLOMINE HCL 20 MG PO TABS: 20 mg | ORAL | Qty: 1

## 2019-02-09 MED FILL — METOCLOPRAMIDE HCL 5 MG/ML IJ SOLN: 5 mg/mL | INTRAMUSCULAR | Qty: 2

## 2019-02-09 MED FILL — DIPHENHYDRAMINE HCL 50 MG/ML IJ SOLN: 50 mg/mL | INTRAMUSCULAR | Qty: 1

## 2019-02-09 MED FILL — AMLODIPINE BESYLATE 5 MG PO TABS: 5 mg | ORAL | Qty: 1

## 2019-02-09 MED FILL — ONDANSETRON 4 MG PO TBDP: 4 mg | ORAL | Qty: 1

## 2019-02-09 MED FILL — FLUTICASONE PROPIONATE 50 MCG/ACT NA SUSP: 50 MCG/ACT | NASAL | Qty: 16

## 2019-02-09 MED FILL — PREDNISONE 10 MG PO TABS: 10 mg | ORAL | Qty: 1

## 2019-02-09 MED FILL — MAPAP 500 MG PO TABS: 500 mg | ORAL | Qty: 2

## 2019-02-09 MED FILL — LISINOPRIL 20 MG PO TABS: 20 mg | ORAL | Qty: 2

## 2019-02-09 MED FILL — BENZTROPINE MESYLATE 0.5 MG PO TABS: 0.5 mg | ORAL | Qty: 1

## 2019-02-09 MED FILL — TAMOXIFEN CITRATE 10 MG PO TABS: 10 mg | ORAL | Qty: 2

## 2019-02-09 MED FILL — CEFTRIAXONE SODIUM 1 G IJ SOLR: 1 g | INTRAMUSCULAR | Qty: 1

## 2019-02-09 MED FILL — CYCLOBENZAPRINE HCL 10 MG PO TABS: 10 mg | ORAL | Qty: 1

## 2019-02-09 MED FILL — ENOXAPARIN SODIUM 40 MG/0.4ML SC SOLN: 40 MG/0.4ML | SUBCUTANEOUS | Qty: 0.4

## 2019-02-09 NOTE — ED Notes (Signed)
Dr Allena Katz now at bs attempting USGIV. Pt states h/a is improving     Verlon Au A. Micah Noel, RN  02/09/19 1358

## 2019-02-09 NOTE — ED Notes (Addendum)
Pt to CT via cart. Pt states nausea is improving.      Verlon Au A. Micah Noel, RN  02/09/19 1001       Aleyssa Pike A. Micah Noel, RN  02/09/19 1001

## 2019-02-09 NOTE — ED Notes (Signed)
Pt transported on covid precautions. IVF LR still infusing and [employer] IVPB continued. Pt is alert and oriented and cane plus belonging bag transferred with pt      Clemon Chambers. Mare Loan, RN  02/09/19 1601

## 2019-02-09 NOTE — ED Notes (Signed)
Dr Allena Katz able to establish USGIV after 2 attempts     Verlon Au A. Micah Noel, RN  02/09/19 1108

## 2019-02-09 NOTE — ED Notes (Signed)
RN spoke with care manager  Clifton Custard [phone]     Clemon Chambers. Mare Loan, RN  02/09/19 1549

## 2019-02-09 NOTE — ED Notes (Signed)
IVF started. Pt resting calm and watching TV. resps nonlabored and denies SOB. Skin pink/warm/dry. States h/a is 2/10. SRs remain up x2. Call light in reach.bed low.      Verlon Au A. Micah Noel, RN  02/09/19 1114

## 2019-02-09 NOTE — ED Notes (Signed)
rec'd report per RN Doreatha Lew Medic John currently attempting [employer] A. Micah Noel, RN  02/09/19 [phone]

## 2019-02-09 NOTE — ED Provider Notes (Signed)
Emergency Department Encounter  [facility] EMERGENCY DEPT    Patient: [name]  MRN: [id number]  DOB: [birth year]  Date of Evaluation: 02/09/2019  ED Supervising Physician: Winona Legato, MD    I independently examined and evaluated Dawn Padilla.    I wore a n95 mask for the entirety of this encounter.    In brief, Dawn Padilla is a 68 y.o. female that presents to the emergency department complaining of headache, fever 102.6.  Patient was discharged from the hospital yesterday    Focused exam: Heart mildly tachycardic, lungs clear to auscultation bilaterally    Brief ED course/MDM: Electrocardiogram interpreted by me shows sinus tachycardia 122 bpm, QTC 415, QRS 76, no acute ST or T-wave changes, no old electrocardiogram immediately available for comparison  patient has history of breast cancer and can only have IV access on one side, multiple attempts at trying to get an IV on the side including peripheral ultrasound-guided attempts without success.  patient given headache cocktail intramuscular, and plan for IV fluid resuscitation if we are able to get a line  CT head pending    Diagnosis/Plan: covid19, dehydration, tachycardia, headache    All diagnostic, treatment, and disposition decisions were made by myself in conjunction with the APP/Resident. For all further details of the patient's emergency department visit, please see their documentation.    Comment: Please note this report has been produced using speech recognition software and may contain errors related to that system including errors in grammar, punctuation, and spelling, as well as words and phrases that may be inappropriate.  If there are any questions or concerns please feel free to contact the dictating provider for clarification.    Winona Legato, MD  [employer]       Winona Legato, MD  02/09/19 [phone]

## 2019-02-09 NOTE — ED Notes (Signed)
J rongus medic at bedside to do US guided IV access. Dr. Orville Govern notified of medication delay due to no IV access at this time.      Dawn Padilla A. Dawn Padilla, [place]  02/09/19 [phone]

## 2019-02-09 NOTE — ED Notes (Signed)
Per [employer], unsuccessful IV attempts x2 to [facility] but able to obtain labs. Dr Allena Katz and [facility] notified and state they will attempt USGIV     Verlon Au A. Micah Noel, RN  02/09/19 1003

## 2019-02-09 NOTE — ED Notes (Signed)
Initial attempt t0 initiate IV using [place] unsuccessful. Pt medicated with po meds per order. Pt complaining of nausea. Report to Art Buff RN     Vonda Harth A. Adriana Simas, [place]  02/09/19 [phone]
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
## 2019-02-09 NOTE — ED Notes (Signed)
Pt now c/o tenderness to L.arm near IV site, upon assessing, noted swelling to area at IV site. IVF clamped right away. Will notify Dr Allena Katz that USGIV has infiltrated.     Verlon Au A. Micah Noel, RN  02/09/19 1440

## 2019-02-09 NOTE — ED Notes (Signed)
This RN wore all PPE required to enter room.  Assisted pt to/from bedside commode with standby assist.  Pt very short of breath with activity from moving from bed to bedside commode.  Pt on room air, 100% at rest, pt dropped to 94% with movement.  Respiratory rate increased to 38.  Primary RN notified     Talmadge Chad. Marcelline Mates, RN  02/09/19 1300

## 2019-02-09 NOTE — ED Notes (Signed)
RN entered room in appropriate PPE for [phone] exposure. Pt complains to being cold with visible chills. Pts temp 102.6 orally. Pt informed RN "they used the ultrasound to find my vein for the last IV". Pt discharged yesterday from admission to hospital. RN with one unsuccessful IV start attempt to L wrist. Pt had recent R mastectomy so R arm unusable, limb alert bracelent placed on R arm. J [place] medic notified to attempt [place] IV. Dr. Orville Govern notified of patients temp. Pt placed on continuous cardiac monitor.      Greidys Deland A. Adriana Simas, [place]  02/09/19 [phone]

## 2019-02-09 NOTE — IP Home Care (Signed)
Patient is in an active home care episode with [facility]. Will follow.  Electronically signed by Tobie Poet, RN on 02/09/19 at 5:11 PM EST

## 2019-02-09 NOTE — ED Triage Notes (Signed)
Arrives via EMS

## 2019-02-09 NOTE — ED Provider Notes (Signed)
I did not participate in the evaluation or treatment of this patient.     Louann Sjogren, PA-C  02/09/19 1538
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
## 2019-02-09 NOTE — ED Notes (Signed)
Bed: 17  Expected date:   Expected time:   Means of arrival:   Comments:  EMS     Danella Penton, RN  02/09/19 [phone]

## 2019-02-10 LAB — PROCALCITONIN: Procalcitonin: <0.1 ng/mL (ref <0.10)

## 2019-02-10 LAB — COMPREHENSIVE METABOLIC PANEL W/ REFLEX TO MG FOR LOW K
ALT: 58 U/L — ABNORMAL HIGH (ref 0–34)
AST: 74 U/L — ABNORMAL HIGH (ref 15–46)
Albumin,Serum: 3.1 g/dL — ABNORMAL LOW (ref 3.5–5.0)
Alkaline Phosphatase: 43 U/L (ref 38–126)
Anion Gap: 6 NA
BUN: 15 mg/dL (ref 7–20)
CO2: 25 mmol/L (ref 22–30)
Calcium: 8 mg/dL — ABNORMAL LOW (ref 8.4–10.4)
Chloride: 106 mmol/L (ref 98–107)
Creatinine: 0.84 mg/dL (ref 0.52–1.25)
EGFR IF NonAfrican American: 70.2 mL/min (ref >60)
Glucose: 98 mg/dL (ref 70–100)
Potassium: 4.2 mmol/L (ref 3.5–5.1)
Sodium: 137 mmol/L (ref 135–145)
Total Bilirubin: 0.3 mg/dL (ref 0.2–1.3)
Total Protein: 5.8 g/dL — ABNORMAL LOW (ref 6.3–8.2)
eGFR African American: 81.4 mL/min (ref >60)

## 2019-02-10 LAB — CBC WITH AUTO DIFFERENTIAL
Absolute Baso #: 0 10*3/uL (ref 0.0–0.2)
Absolute Eos #: 0 10*3/uL (ref 0.0–0.5)
Absolute Lymph #: 1 10*3/uL (ref 1.0–4.3)
Absolute Mono #: 0.5 10*3/uL (ref 0.0–0.8)
Absolute Neut #: 4.5 10*3/uL (ref 1.8–7.0)
Basophils: 0.5 % (ref 0.0–2.0)
Eosinophils: 0.1 % — ABNORMAL LOW (ref 1.0–6.0)
Granulocytes %: 74.6 % (ref 40.0–80.0)
Hematocrit: 38.6 % (ref 35.0–47.0)
Hemoglobin: 12.9 g/dL (ref 11.7–16.0)
Lymphocyte %: 17 % — ABNORMAL LOW (ref 20.0–40.0)
MCH: 33.4 pg (ref 26.0–34.0)
MCHC: 33.3 % (ref 32.0–36.0)
MCV: 100.2 fL — ABNORMAL HIGH (ref 79.0–98.0)
MPV: 7.4 fL (ref 7.4–10.4)
Monocytes: 7.8 % (ref 2.0–10.0)
Platelets: 194 10*3/uL (ref 140–440)
RBC: 3.85 10*6/uL (ref 3.80–5.20)
RDW: 13.8 % (ref 11.5–14.5)
WBC: 6 10*3/uL (ref 3.6–10.7)

## 2019-02-10 LAB — STREP PNEUMONIAE ANTIGEN: STREP PNEUMONIAE ANTIGEN, URINE: NOT DETECTED

## 2019-02-10 LAB — FERRITIN: Ferritin: 208 ng/mL (ref 8–252)

## 2019-02-10 LAB — ADD ON LAB TEST

## 2019-02-10 LAB — LEGIONELLA ANTIGEN, URINE: LEGIONELLA ANTIGEN: NOT DETECTED

## 2019-02-10 LAB — D-DIMER, QUANTITATIVE: D-Dimer, Quant: 0.35 mg/L (ref 0.00–0.50)

## 2019-02-10 LAB — LACTATE DEHYDROGENASE: LD: 279 U/L — ABNORMAL HIGH (ref 120–246)

## 2019-02-10 LAB — C-REACTIVE PROTEIN: CRP: 24.3 mg/L — ABNORMAL HIGH (ref 0.0–6.0)

## 2019-02-10 MED ORDER — SODIUM CHLORIDE 0.9 % IV BOLUS
0.9 % | Freq: Once | INTRAVENOUS | Status: AC
Start: 2019-02-10 — End: 2019-02-10
  Administered 2019-02-10: 22:00:00 500 mL via INTRAVENOUS

## 2019-02-10 MED ORDER — HYDROCORTISONE NA SUCCINATE PF 100 MG IJ SOLR
100 MG | Freq: Three times a day (TID) | INTRAMUSCULAR | Status: DC
Start: 2019-02-10 — End: 2019-02-11
  Administered 2019-02-10 – 2019-02-11 (×4): 100 mg via INTRAVENOUS

## 2019-02-10 MED ORDER — CEFTRIAXONE SODIUM 1 G IV SOLR
1 g | INTRAVENOUS | Status: DC
Start: 2019-02-10 — End: 2019-02-10

## 2019-02-10 MED ORDER — DOXYCYCLINE MONOHYDRATE 100 MG PO CAPS
100 MG | Freq: Two times a day (BID) | ORAL | Status: DC
Start: 2019-02-10 — End: 2019-02-10
  Administered 2019-02-10 (×2): 100 mg via ORAL

## 2019-02-10 MED ORDER — SODIUM CHLORIDE 0.9 % IV SOLN
0.9 % | Freq: Once | INTRAVENOUS | Status: AC
Start: 2019-02-10 — End: 2019-02-11
  Administered 2019-02-10: via INTRAVENOUS

## 2019-02-10 MED ORDER — LACTATED RINGERS IV SOLN
Freq: Once | INTRAVENOUS | Status: AC
Start: 2019-02-10 — End: 2019-02-10
  Administered 2019-02-10: 01:00:00 via INTRAVENOUS

## 2019-02-10 MED ORDER — DOCUSATE SODIUM 100 MG PO CAPS
100 MG | Freq: Two times a day (BID) | ORAL | Status: DC
Start: 2019-02-10 — End: 2019-02-16
  Administered 2019-02-11 – 2019-02-16 (×12): 100 mg via ORAL

## 2019-02-10 MED FILL — PREDNISONE 10 MG PO TABS: 10 mg | ORAL | Qty: 1

## 2019-02-10 MED FILL — TAMOXIFEN CITRATE 10 MG PO TABS: 10 mg | ORAL | Qty: 2

## 2019-02-10 MED FILL — METOPROLOL TARTRATE 100 MG PO TABS: 100 mg | ORAL | Qty: 1

## 2019-02-10 MED FILL — CYCLOBENZAPRINE HCL 10 MG PO TABS: 10 mg | ORAL | Qty: 1

## 2019-02-10 MED FILL — DOXYCYCLINE MONOHYDRATE 100 MG PO CAPS: 100 mg | ORAL | Qty: 1

## 2019-02-10 MED FILL — AMLODIPINE BESYLATE 5 MG PO TABS: 5 mg | ORAL | Qty: 1

## 2019-02-10 MED FILL — SOLU-CORTEF 100 MG IJ SOLR: 100 mg | INTRAMUSCULAR | Qty: 2

## 2019-02-10 MED FILL — BENZTROPINE MESYLATE 0.5 MG PO TABS: 0.5 mg | ORAL | Qty: 1

## 2019-02-10 MED FILL — ENOXAPARIN SODIUM 40 MG/0.4ML SC SOLN: 40 MG/0.4ML | SUBCUTANEOUS | Qty: 0.4

## 2019-02-10 MED FILL — HYDRALAZINE HCL 50 MG PO TABS: 50 mg | ORAL | Qty: 1

## 2019-02-10 MED FILL — ATORVASTATIN CALCIUM 20 MG PO TABS: 20 mg | ORAL | Qty: 1

## 2019-02-10 MED FILL — CEFTRIAXONE SODIUM 1 G IV SOLR: 1 g | INTRAVENOUS | Qty: 1

## 2019-02-10 MED FILL — LISINOPRIL 20 MG PO TABS: 20 mg | ORAL | Qty: 2

## 2019-02-10 MED FILL — ACETAMINOPHEN 325 MG PO TABS: 325 mg | ORAL | Qty: 2

## 2019-02-10 NOTE — Telephone Encounter (Signed)
Discharge Summary faxed to [facility] office. PCP is an outside doctor. Thanks!

## 2019-02-10 NOTE — Care Coordination-Inpatient (Signed)
Discharge Planning  Current Residence: Private Residence  Living Arrangements: Other (Comment)(Granddaughter)  Support Systems: ESP/Passport  Current Services Prior To Admission: Meals On Wheels, ESP/Passport, Emergency Call  System  Potential Assistance Needed: N/A  Potential Assistance Purchasing Medications: No  Meds-to-Beds: Does the patient want to have any new prescriptions delivered to bedside prior to discharge?: Not Assessed  Type of Home Care Services: Aide Services, Meals on Wheels  Patient expects to be discharged to:: TBD  Expected Discharge Date: (TBD)  Follow Up Appointment: Best Day/Time : (na)      Emergency Contacts  Contact 1: Name: Janett Billow  Contact 1: Number: [phone]  Contact 1: Relationship: granddaughter  Contact 2: Name: Celene Skeen 2: Number: [phone]      [place] History  Lives With: Other (comment)(Granddaughter)  Type of Home: House  Home Layout: One level  Home Access: Level entry  Bathroom Shower/Tub: Administrator, Civil Service: Standard  Bathroom Equipment: Grab bars in Banker: Accessible  Home Equipment: [employer], Set designer Help From: Family, Home health  ADL Assistance: Needs assistance  Bath: Minimal assistance  Homemaking Assistance: (na)  Ambulation Assistance: Needs assistance  Transfer Assistance: Independent  Active Driver: Yes  Mode of Transportation: (na)  Education: na  Occupation: (na)  Type of occupation: na  Leisure & Hobbies: na  IADL Comments: na  Additional Comments: na        COVID +. Recent discharge. No facility stays. From home with Granddaughter. Active with [facility] Passport services. HHA 3 hrs 3 days per week. Electronically signed by Adrian Saran, RN on 02/10/2019 at 11:25 AM  ]

## 2019-02-10 NOTE — Progress Notes (Addendum)
[facility] Progress Note        Dawn Padilla  DOB:  1950/09/26(68 y.o.)  MRN:  [id number]    Date: 02/10/19   Subjective:    HPI  The patient complains of HA and fever  fiO2 RA  Tmax102.43F  Became ill 11/6  Initial symptom HA  Came back to ED due to high fevers and chills  Felt SOB this morning while in bathroom  + dry cough  Appetite not bad  +constipation  Not ill appearing    Scheduled Meds:  ??? sodium chloride flush  3 mL Intravenous Q8H   ??? amLODIPine  5 mg Oral Daily   ??? atorvastatin  20 mg Oral Daily   ??? benztropine  0.5 mg Oral Nightly   ??? cyclobenzaprine  10 mg Oral QPM   ??? fluticasone  1 spray Each Nostril Daily   ??? hydrALAZINE  25 mg Oral Q12H   ??? lisinopril  40 mg Oral Daily   ??? metoprolol  100 mg Oral Daily   ??? predniSONE  10 mg Oral Daily   ??? tamoxifen  20 mg Oral Daily   ??? enoxaparin  40 mg Subcutaneous Daily   ??? cefTRIAXone (ROCEPHIN) IV  1 g Intravenous Q24H   ??? doxycycline monohydrate  100 mg Oral 2 times per day     Continuous Infusions:  PRN Meds:dicyclomine, gabapentin, albuterol sulfate HFA, potassium chloride **OR** potassium alternative oral replacement **OR** potassium chloride, acetaminophen **OR** acetaminophen, polyethylene glycol, promethazine **OR** ondansetron, loperamide    Review of Systems   Respiratory: Positive for cough and shortness of breath.    Cardiovascular: Negative for chest pain and palpitations.       Interval Pertinent History:  Social History     Tobacco Use   ??? Smoking status: Never Smoker   ??? Smokeless tobacco: Never Used   Substance Use Topics   ??? Alcohol use: No       Objective:     Patient Vitals for the past 24 hrs:   BP Temp Temp src Pulse Resp SpO2   02/10/19 1132 (!) 99/45 97.3 ??F (36.3 ??C) Temporal 75 16 96 %   02/10/19 0957 115/65 99.1 ??F (37.3 ??C) Temporal 103 20 97 %   02/10/19 0303 103/68 98.5 ??F (36.9 ??C) Temporal 78 18 93 %   02/09/19 2346 (!) 101/51 98.2 ??F (36.8 ??C) Temporal 82 16 96 %   02/09/19 2007 124/64 100.4 ??F (38 ??C) Oral 119 20  95 %   02/09/19 1559 107/70 -- -- 114 18 98 %       Average, Min, and Max for last 24 hours Vitals:  TEMPERATURE:  Temp  Avg: 98.7 ??F (37.1 ??C)  Min: 97.3 ??F (36.3 ??C)  Max: 100.4 ??F (38 ??C)    RESPIRATIONS RANGE: Resp  Avg: 18  Min: 16  Max: 20    PULSE RANGE: Pulse  Avg: 95.2  Min: 75  Max: 119    BLOOD PRESSURE RANGE:  Systolic (123XX123), [id number] , Min:99 , 123XX123   ; Diastolic (123XX123), 0000000, Min:45, Max:70      PULSE OXIMETRY RANGE: SpO2  Avg: 95.8 %  Min: 93 %  Max: 98 %    I/O last 3 completed shifts:  In: 1000 [IV Piggyback:1000]  Out: -     Physical Exam  Constitutional:       Appearance: She is well-developed.   HENT:      Head: Normocephalic.   Eyes:  General: No scleral icterus.     Conjunctiva/sclera: Conjunctivae normal.   Neck:      Musculoskeletal: Neck supple.      Vascular: No JVD.   Cardiovascular:      Rate and Rhythm: Normal rate and regular rhythm.      Heart sounds: No murmur. No gallop.    Pulmonary:      Effort: Pulmonary effort is normal.      Breath sounds: No wheezing or rales.   Chest:      Chest wall: No tenderness.   Abdominal:      General: Bowel sounds are normal. There is no distension.      Palpations: Abdomen is soft.      Tenderness: There is no abdominal tenderness.   Musculoskeletal:         General: No tenderness.   Skin:     General: Skin is warm and dry.   Neurological:      Mental Status: She is alert and oriented to person, place, and time.      Cranial Nerves: No cranial nerve deficit.      Coordination: Coordination normal.   Psychiatric:         Judgment: Judgment normal.         Lab Results   Component Value Date    WBC 6.0 02/10/2019    HGB 12.9 02/10/2019    HCT 38.6 02/10/2019    MCV 100.2 (H) 02/10/2019    PLT 194 02/10/2019     Lab Results   Component Value Date    NA 137 02/10/2019    K 4.2 02/10/2019    CL 106 02/10/2019    CO2 25 02/10/2019    BUN 15 02/10/2019    CREATININE 0.84 02/10/2019    GLUCOSE 98 02/10/2019    CALCIUM 8.0 02/10/2019        No  results found for: LABA1C   Additional results of the last 24 hours have been reviewed.    Assessment and Plan:         Principal Problem:    Sepsis due to COVID-19 [facility])  Active Problems:    Sinus tachycardia    Acute bronchitis due to COVID-19 virus    Shortness of breath  Resolved Problems:    * No resolved hospital problems. *      1. COVID-19 pneumonia  2. Sepsis POA  3. Hx essential HTN now with soft BP  4. Transaminitis  5. Normal d dimer  6. Chronic use of prednisone  7. Constipation     Symptom onset 11/6    D/C CAP antibiotics  Continue to monitor spO2  Hold home hydralazine & lisinopril due to soft BP  Start colace  If BP remains soft will add stress dose steroids    DVTProphylaxis: lovenox 40 q 24hr - creatinine clearance >30    I spent over 51% of total time providing counseling or incoordination of care: > 35 minutes   discussed with nurse, discussed with TCC/SW, discussed with Dr. Edd Arbour, patient and family updated, I personally examined the patient and I personally reviewed chart, data, labs radiology reports    6AM-6PM please page:   Electronically signed by Smith Robert, MD on 02/10/19 at 2:40 PM EST    6PM-6AM please page:  [facility] Internal Medicine

## 2019-02-11 LAB — CULTURE, BLOOD 2: Blood Culture, Routine: NO GROWTH

## 2019-02-11 LAB — COMPREHENSIVE METABOLIC PANEL
ALT: 47 U/L — ABNORMAL HIGH (ref 0–34)
AST: 55 U/L — ABNORMAL HIGH (ref 15–46)
Albumin,Serum: 2.9 g/dL — ABNORMAL LOW (ref 3.5–5.0)
Alkaline Phosphatase: 48 U/L (ref 38–126)
Anion Gap: 6 NA
BUN: 15 mg/dL (ref 7–20)
CO2: 24 mmol/L (ref 22–30)
Calcium: 7.5 mg/dL — ABNORMAL LOW (ref 8.4–10.4)
Chloride: 109 mmol/L — ABNORMAL HIGH (ref 98–107)
Creatinine: 0.72 mg/dL (ref 0.52–1.25)
EGFR IF NonAfrican American: 84.6 mL/min (ref >60)
Glucose: 149 mg/dL — ABNORMAL HIGH (ref 70–100)
Potassium: 3.9 mmol/L (ref 3.5–5.1)
Sodium: 138 mmol/L (ref 135–145)
Total Bilirubin: 0.4 mg/dL (ref 0.2–1.3)
Total Protein: 5.8 g/dL — ABNORMAL LOW (ref 6.3–8.2)
eGFR African American: >90 mL/min (ref >60)

## 2019-02-11 LAB — MAGNESIUM: Magnesium: 1.7 mg/dL (ref 1.6–2.3)

## 2019-02-11 LAB — CBC
Hematocrit: 38 % (ref 35.0–47.0)
Hemoglobin: 12.7 g/dL (ref 11.7–16.0)
MCH: 33.5 pg (ref 26.0–34.0)
MCHC: 33.4 % (ref 32.0–36.0)
MCV: 100.2 fL — ABNORMAL HIGH (ref 79.0–98.0)
MPV: 7.7 fL (ref 7.4–10.4)
Platelets: 192 10*3/uL (ref 140–440)
RBC: 3.8 10*6/uL (ref 3.80–5.20)
RDW: 13.4 % (ref 11.5–14.5)
WBC: 4.1 10*3/uL (ref 3.6–10.7)

## 2019-02-11 LAB — CULTURE, BLOOD 1: Blood Culture, Routine: NO GROWTH

## 2019-02-11 LAB — D-DIMER, QUANTITATIVE: D-Dimer, Quant: 0.41 mg/L (ref 0.00–0.50)

## 2019-02-11 LAB — PHOSPHORUS: Phosphorus: 3.1 mg/dL (ref 2.5–4.5)

## 2019-02-11 LAB — LACTATE DEHYDROGENASE: LD: 335 U/L — ABNORMAL HIGH (ref 120–246)

## 2019-02-11 LAB — FERRITIN: Ferritin: 230 ng/mL (ref 8–252)

## 2019-02-11 LAB — C-REACTIVE PROTEIN: CRP: 15.8 mg/L — ABNORMAL HIGH (ref 0.0–6.0)

## 2019-02-11 MED ORDER — HYDROCORTISONE NA SUCCINATE PF 100 MG IJ SOLR
100 MG | Freq: Four times a day (QID) | INTRAMUSCULAR | Status: DC
Start: 2019-02-11 — End: 2019-02-12
  Administered 2019-02-12 (×3): 50 mg via INTRAVENOUS

## 2019-02-11 MED ORDER — METOPROLOL TARTRATE 100 MG PO TABS
100 MG | Freq: Every day | ORAL | Status: DC
Start: 2019-02-11 — End: 2019-02-16
  Administered 2019-02-11 – 2019-02-16 (×6): 100 mg via ORAL

## 2019-02-11 MED ORDER — SODIUM CHLORIDE 0.9 % IV SOLN
0.9 % | Freq: Once | INTRAVENOUS | Status: AC
Start: 2019-02-11 — End: 2019-02-12
  Administered 2019-02-11: 18:00:00 via INTRAVENOUS

## 2019-02-11 MED FILL — AMLODIPINE BESYLATE 5 MG PO TABS: 5 mg | ORAL | Qty: 1

## 2019-02-11 MED FILL — SOLU-CORTEF 100 MG IJ SOLR: 100 mg | INTRAMUSCULAR | Qty: 1

## 2019-02-11 MED FILL — DOK 100 MG PO CAPS: 100 mg | ORAL | Qty: 1

## 2019-02-11 MED FILL — SOLU-CORTEF 100 MG IJ SOLR: 100 mg | INTRAMUSCULAR | Qty: 2

## 2019-02-11 MED FILL — BENZTROPINE MESYLATE 0.5 MG PO TABS: 0.5 mg | ORAL | Qty: 1

## 2019-02-11 MED FILL — METOPROLOL TARTRATE 100 MG PO TABS: 100 mg | ORAL | Qty: 1

## 2019-02-11 MED FILL — TAMOXIFEN CITRATE 10 MG PO TABS: 10 mg | ORAL | Qty: 2

## 2019-02-11 MED FILL — ATORVASTATIN CALCIUM 20 MG PO TABS: 20 mg | ORAL | Qty: 1

## 2019-02-11 MED FILL — ENOXAPARIN SODIUM 40 MG/0.4ML SC SOLN: 40 MG/0.4ML | SUBCUTANEOUS | Qty: 0.4

## 2019-02-11 MED FILL — ACETAMINOPHEN 325 MG PO TABS: 325 mg | ORAL | Qty: 2

## 2019-02-11 MED FILL — CYCLOBENZAPRINE HCL 10 MG PO TABS: 10 mg | ORAL | Qty: 1

## 2019-02-11 NOTE — Progress Notes (Signed)
Nutrition rescreen completed. Chart reviewed. Patient to be monitored and followed by the diet technician. Dietitian available upon request.  Nigel Ericsson, DT

## 2019-02-11 NOTE — Progress Notes (Signed)
[facility] Progress Note        Dawn Padilla  DOB:  [birth year](68 y.o.)  MRN:  [id number]    Date: 02/11/19   Subjective:    HPI  The patient complains of fever  fiO2 remains RA --> spO2 has been excellent  No fever since 11/9  Hemodynamics much better  Overall feels 25% better  HA less severe  Still dry cough  Appetite poor  No smell or taste  Feels tired and SOB when ambulating  No diarrhea  + orthostatic dizziness  Chronic prednisone every since 10/2018 --> prescribed by PCP for back pain/sciatica     Scheduled Meds:  ??? metoprolol tartrate  100 mg Oral Daily   ??? hydrocortisone sodium succinate PF  50 mg Intravenous Q6H   ??? docusate sodium  100 mg Oral BID   ??? sodium chloride flush  3 mL Intravenous Q8H   ??? amLODIPine  5 mg Oral Daily   ??? atorvastatin  20 mg Oral Daily   ??? benztropine  0.5 mg Oral Nightly   ??? cyclobenzaprine  10 mg Oral QPM   ??? fluticasone  1 spray Each Nostril Daily   ??? [Held by provider] hydrALAZINE  25 mg Oral Q12H   ??? [Held by provider] lisinopril  40 mg Oral Daily   ??? [Held by provider] predniSONE  10 mg Oral Daily   ??? tamoxifen  20 mg Oral Daily   ??? enoxaparin  40 mg Subcutaneous Daily     Continuous Infusions:  PRN Meds:dicyclomine, gabapentin, albuterol sulfate HFA, potassium chloride **OR** potassium alternative oral replacement **OR** potassium chloride, acetaminophen **OR** acetaminophen, polyethylene glycol, promethazine **OR** ondansetron, loperamide    Review of Systems    Interval Pertinent History:  Social History     Tobacco Use   ??? Smoking status: Never Smoker   ??? Smokeless tobacco: Never Used   Substance Use Topics   ??? Alcohol use: No       Objective:     Patient Vitals for the past 24 hrs:   BP Temp Temp src Pulse Resp SpO2   02/11/19 1539 136/73 97.6 ??F (36.4 ??C) Temporal 119 20 95 %   02/11/19 1139 128/71 98.8 ??F (37.1 ??C) Oral 109 20 97 %   02/11/19 0752 121/64 97 ??F (36.1 ??C) Temporal 92 20 96 %   02/11/19 0313 116/62 98 ??F (36.7 ??C) Temporal 82 20 96 %    02/11/19 0052 (!) 112/54 97 ??F (36.1 ??C) Temporal 75 20 97 %   02/10/19 2102 (!) 105/56 98.2 ??F (36.8 ??C) Temporal 83 20 97 %       Average, Min, and Max for last 24 hours Vitals:  TEMPERATURE:  Temp  Avg: 97.8 ??F (36.6 ??C)  Min: 97 ??F (36.1 ??C)  Max: 98.8 ??F (37.1 ??C)    RESPIRATIONS RANGE: Resp  Avg: 20  Min: 20  Max: 20    PULSE RANGE: Pulse  Avg: 93.3  Min: 75  Max: 119    BLOOD PRESSURE RANGE:  Systolic (123XX123), 99991111 , Min:105 , [id number]   ; Diastolic (123XX123), 99991111, Min:54, Max:73      PULSE OXIMETRY RANGE: SpO2  Avg: 96.3 %  Min: 95 %  Max: 97 %    No intake/output data recorded.    Physical Exam  Constitutional:       Appearance: She is well-developed.   HENT:      Head: Normocephalic.   Eyes:  General: No scleral icterus.     Conjunctiva/sclera: Conjunctivae normal.   Neck:      Musculoskeletal: Neck supple.      Vascular: No JVD.   Cardiovascular:      Rate and Rhythm: Normal rate and regular rhythm.      Heart sounds: No murmur. No gallop.    Pulmonary:      Effort: Pulmonary effort is normal.      Breath sounds: No wheezing or rales.   Chest:      Chest wall: No tenderness.   Abdominal:      General: Bowel sounds are normal. There is no distension.      Palpations: Abdomen is soft.      Tenderness: There is no abdominal tenderness.   Musculoskeletal:         General: No tenderness.   Skin:     General: Skin is warm and dry.   Neurological:      Mental Status: She is alert and oriented to person, place, and time.      Cranial Nerves: No cranial nerve deficit.      Coordination: Coordination normal.   Psychiatric:         Judgment: Judgment normal.         Lab Results   Component Value Date    WBC 4.1 02/11/2019    HGB 12.7 02/11/2019    HCT 38.0 02/11/2019    MCV 100.2 (H) 02/11/2019    PLT 192 02/11/2019     Lab Results   Component Value Date    NA 138 02/11/2019    K 3.9 02/11/2019    CL 109 02/11/2019    CO2 24 02/11/2019    BUN 15 02/11/2019    CREATININE 0.72 02/11/2019    GLUCOSE 149  02/11/2019    CALCIUM 7.5 02/11/2019        No results found for: LABA1C   Additional results of the last 24 hours have been reviewed.    Assessment and Plan:         Principal Problem:    Sepsis due to COVID-19 [facility])  Active Problems:    Sinus tachycardia    Acute bronchitis due to COVID-19 virus    Shortness of breath  Resolved Problems:    * No resolved hospital problems. *      1. COVID-19 pneumonia  2. Sepsis POA  3. Hx essential   4. HTN now with soft BP  5. Transaminitis  6. Normal d dimer  7. Chronic use of prednisone with relative AI --> hemodynamics much better with steroids, holding home antihypertensives and volume expansion  8. Constipation     Symptom onset 11/6    Continue IV steroids for relative AI but start to wean  Give an additional 1L NS due to persistent orthostatic dizziness + poor PO intake  Resume home metoprolol  PT evaluation    Continue supportive care alone for COVID, relative AI and volume depletion     DVTProphylaxis: lovenox 40 q 24hr - creatinine clearance >30    I spent over 51% of total time providing counseling or incoordination of care: > 35 minutes   discussed with nurse, discussed with TCC/SW, discussed with Dr. Edd Arbour, patient and family updated, I personally examined the patient and I personally reviewed chart, data, labs radiology reports    6AM-6PM please page:   Electronically signed by Smith Robert, MD on 02/11/19 at 5:25 PM EST    6PM-6AM please page:  [facility] Internal Medicine

## 2019-02-12 LAB — CBC
Hematocrit: 38.2 % (ref 35.0–47.0)
Hemoglobin: 12.8 g/dL (ref 11.7–16.0)
MCH: 33.3 pg (ref 26.0–34.0)
MCHC: 33.5 % (ref 32.0–36.0)
MCV: 99.5 fL — ABNORMAL HIGH (ref 79.0–98.0)
MPV: 7.8 fL (ref 7.4–10.4)
Platelets: 225 10*3/uL (ref 140–440)
RBC: 3.84 10*6/uL (ref 3.80–5.20)
RDW: 13.2 % (ref 11.5–14.5)
WBC: 14 10*3/uL — ABNORMAL HIGH (ref 3.6–10.7)

## 2019-02-12 LAB — TYPE AND SCREEN
Antibody Screen: NEGATIVE NA
Rh Type: POSITIVE NA

## 2019-02-12 LAB — COMPREHENSIVE METABOLIC PANEL
ALT: 38 U/L — ABNORMAL HIGH (ref 0–34)
AST: 53 U/L — ABNORMAL HIGH (ref 15–46)
Albumin,Serum: 3 g/dL — ABNORMAL LOW (ref 3.5–5.0)
Alkaline Phosphatase: 53 U/L (ref 38–126)
Anion Gap: 5 NA
BUN: 19 mg/dL (ref 7–20)
CO2: 25 mmol/L (ref 22–30)
Calcium: 7.9 mg/dL — ABNORMAL LOW (ref 8.4–10.4)
Chloride: 108 mmol/L — ABNORMAL HIGH (ref 98–107)
Creatinine: 0.65 mg/dL (ref 0.52–1.25)
EGFR IF NonAfrican American: 89.7 mL/min (ref >60)
Glucose: 114 mg/dL — ABNORMAL HIGH (ref 70–100)
Potassium: 4.3 mmol/L (ref 3.5–5.1)
Sodium: 138 mmol/L (ref 135–145)
Total Bilirubin: 0.6 mg/dL (ref 0.2–1.3)
Total Protein: 6 g/dL — ABNORMAL LOW (ref 6.3–8.2)
eGFR African American: >90 mL/min (ref >60)

## 2019-02-12 LAB — FERRITIN: Ferritin: 238 ng/mL (ref 8–252)

## 2019-02-12 LAB — PHOSPHORUS: Phosphorus: 2.6 mg/dL (ref 2.5–4.5)

## 2019-02-12 LAB — MAGNESIUM: Magnesium: 1.9 mg/dL (ref 1.6–2.3)

## 2019-02-12 LAB — LACTATE DEHYDROGENASE: LD: 434 U/L — ABNORMAL HIGH (ref 120–246)

## 2019-02-12 LAB — D-DIMER, QUANTITATIVE: D-Dimer, Quant: 0.47 mg/L (ref 0.00–0.50)

## 2019-02-12 LAB — C-REACTIVE PROTEIN: CRP: 7.5 mg/L — ABNORMAL HIGH (ref 0.0–6.0)

## 2019-02-12 MED ORDER — SALINE SPRAY 0.65 % NA SOLN
0.65 % | NASAL | Status: DC | PRN
Start: 2019-02-12 — End: 2019-02-16

## 2019-02-12 MED ORDER — SODIUM CHLORIDE 0.9 % IV BOLUS
0.9 % | Freq: Once | INTRAVENOUS | Status: AC
Start: 2019-02-12 — End: 2019-02-12
  Administered 2019-02-13: 02:00:00 20 mL via INTRAVENOUS

## 2019-02-12 MED ORDER — SODIUM CHLORIDE 0.9 % IV SOLN
0.9 % | Freq: Once | INTRAVENOUS | Status: AC
Start: 2019-02-12 — End: 2019-02-12
  Administered 2019-02-12: 23:00:00 200 mg via INTRAVENOUS

## 2019-02-12 MED ORDER — REMDESIVIR 100 MG/20 ML (MIXTURES ONLY)
100 MG | INTRAVENOUS | Status: AC
Start: 2019-02-12 — End: 2019-02-16
  Administered 2019-02-13 – 2019-02-16 (×4): 100 mg via INTRAVENOUS

## 2019-02-12 MED ORDER — HYDROCORTISONE NA SUCCINATE PF 100 MG IJ SOLR
100 MG | Freq: Three times a day (TID) | INTRAMUSCULAR | Status: DC
Start: 2019-02-12 — End: 2019-02-13
  Administered 2019-02-13: 05:00:00 50 mg via INTRAVENOUS

## 2019-02-12 MED ORDER — SODIUM CHLORIDE 0.9 % IV BOLUS
0.9 % | INTRAVENOUS | Status: DC | PRN
Start: 2019-02-12 — End: 2019-02-16

## 2019-02-12 MED FILL — SOLU-CORTEF 100 MG IJ SOLR: 100 mg | INTRAMUSCULAR | Qty: 1

## 2019-02-12 MED FILL — DEEP SEA NASAL SPRAY 0.65 % NA SOLN: 0.65 % | NASAL | Qty: 44

## 2019-02-12 MED FILL — BENZTROPINE MESYLATE 0.5 MG PO TABS: 0.5 mg | ORAL | Qty: 1

## 2019-02-12 MED FILL — ACETAMINOPHEN 325 MG PO TABS: 325 mg | ORAL | Qty: 2

## 2019-02-12 MED FILL — ENOXAPARIN SODIUM 40 MG/0.4ML SC SOLN: 40 MG/0.4ML | SUBCUTANEOUS | Qty: 0.4

## 2019-02-12 MED FILL — DOK 100 MG PO CAPS: 100 mg | ORAL | Qty: 1

## 2019-02-12 MED FILL — VEKLURY 100 MG IV SOLR: 100 mg | INTRAVENOUS | Qty: 200

## 2019-02-12 MED FILL — TAMOXIFEN CITRATE 10 MG PO TABS: 10 mg | ORAL | Qty: 2

## 2019-02-12 MED FILL — CYCLOBENZAPRINE HCL 10 MG PO TABS: 10 mg | ORAL | Qty: 1

## 2019-02-12 MED FILL — METOPROLOL TARTRATE 100 MG PO TABS: 100 mg | ORAL | Qty: 1

## 2019-02-12 MED FILL — ATORVASTATIN CALCIUM 20 MG PO TABS: 20 mg | ORAL | Qty: 1

## 2019-02-12 MED FILL — AMLODIPINE BESYLATE 5 MG PO TABS: 5 mg | ORAL | Qty: 1

## 2019-02-12 NOTE — Care Coordination-Inpatient (Signed)
Length of Stay: 1     GMLOS: 4.8     Anticipated date of discharge: Possible discharge today.     Medical Plan of Care: RA Monitoring labs and VS    DC Disposition: other: Plan is home. Per [facility] CM It will be up to patients HHA if they want to continue services while patient is covid +.     Discharge Barriers: No     I tried calling patient in room. No answer. I let bedside Nurse aware of situation with HHA. Electronically signed by Adrian Saran, RN on 02/12/2019 at 11:05 AM

## 2019-02-12 NOTE — Progress Notes (Signed)
[facility] Progress Note        ALLA BRANDENBERGER  DOB:  20-May-1950(68 y.o.)  MRN:  [id number]    Date: 02/12/19   Subjective:    HPI  The patient complains of fever  fiO2 remains RA --> spO2 92% today at 0800 and at 1440  No fevers  SBP 120s, HR 80s  Complains feels a bit worse today --> increased cough (some sputum) and "like I have a sinus infection"  + normal BM  Feeling more steady with ambulation and orthostatic dizziness improved  CXR personally reviewed    Scheduled Meds:  ??? metoprolol tartrate  100 mg Oral Daily   ??? hydrocortisone sodium succinate PF  50 mg Intravenous Q6H   ??? docusate sodium  100 mg Oral BID   ??? sodium chloride flush  3 mL Intravenous Q8H   ??? amLODIPine  5 mg Oral Daily   ??? atorvastatin  20 mg Oral Daily   ??? benztropine  0.5 mg Oral Nightly   ??? cyclobenzaprine  10 mg Oral QPM   ??? fluticasone  1 spray Each Nostril Daily   ??? [Held by provider] hydrALAZINE  25 mg Oral Q12H   ??? [Held by provider] lisinopril  40 mg Oral Daily   ??? [Held by provider] predniSONE  10 mg Oral Daily   ??? tamoxifen  20 mg Oral Daily   ??? enoxaparin  40 mg Subcutaneous Daily     Continuous Infusions:  PRN Meds:dicyclomine, gabapentin, albuterol sulfate HFA, potassium chloride **OR** potassium alternative oral replacement **OR** potassium chloride, acetaminophen **OR** acetaminophen, polyethylene glycol, promethazine **OR** ondansetron, loperamide    Review of Systems    Interval Pertinent History:  Social History     Tobacco Use   ??? Smoking status: Never Smoker   ??? Smokeless tobacco: Never Used   Substance Use Topics   ??? Alcohol use: No       Objective:     Patient Vitals for the past 24 hrs:   BP Temp Temp src Pulse Resp SpO2   02/12/19 0302 121/65 98.5 ??F (36.9 ??C) Temporal 77 16 96 %   02/11/19 2314 (!) 124/57 98 ??F (36.7 ??C) Temporal 80 16 95 %   02/11/19 1953 (!) 108/55 98.2 ??F (36.8 ??C) Temporal 84 16 97 %   02/11/19 1539 136/73 97.6 ??F (36.4 ??C) Temporal 119 20 95 %   02/11/19 1139 128/71 98.8 ??F (37.1  ??C) Oral 109 20 97 %   02/11/19 0752 121/64 97 ??F (36.1 ??C) Temporal 92 20 96 %       Average, Min, and Max for last 24 hours Vitals:  TEMPERATURE:  Temp  Avg: 98 ??F (36.7 ??C)  Min: 97 ??F (36.1 ??C)  Max: 98.8 ??F (37.1 ??C)    RESPIRATIONS RANGE: Resp  Avg: 18  Min: 16  Max: 20    PULSE RANGE: Pulse  Avg: 93.5  Min: 77  Max: 119    BLOOD PRESSURE RANGE:  Systolic (123XX123), 99991111 , Min:108 , Q000111Q   ; Diastolic (123XX123), A999333, Min:55, Max:73      PULSE OXIMETRY RANGE: SpO2  Avg: 96 %  Min: 95 %  Max: 97 %    No intake/output data recorded.    Physical Exam  Constitutional:       Appearance: She is well-developed.   HENT:      Head: Normocephalic.   Eyes:      General: No scleral icterus.     Conjunctiva/sclera:  Conjunctivae normal.   Neck:      Musculoskeletal: Neck supple.      Vascular: No JVD.   Cardiovascular:      Rate and Rhythm: Normal rate and regular rhythm.      Heart sounds: No murmur. No gallop.    Pulmonary:      Effort: Pulmonary effort is normal.      Breath sounds: No wheezing or rales.   Chest:      Chest wall: No tenderness.   Abdominal:      General: Bowel sounds are normal. There is no distension.      Palpations: Abdomen is soft.      Tenderness: There is no abdominal tenderness.   Musculoskeletal:         General: No tenderness.   Skin:     General: Skin is warm and dry.   Neurological:      Mental Status: She is alert and oriented to person, place, and time.      Cranial Nerves: No cranial nerve deficit.      Coordination: Coordination normal.   Psychiatric:         Judgment: Judgment normal.         Lab Results   Component Value Date    WBC 14.0 (H) 02/12/2019    HGB 12.8 02/12/2019    HCT 38.2 02/12/2019    MCV 99.5 (H) 02/12/2019    PLT 225 02/12/2019     Lab Results   Component Value Date    NA 138 02/12/2019    K 4.3 02/12/2019    CL 108 02/12/2019    CO2 25 02/12/2019    BUN 19 02/12/2019    CREATININE 0.65 02/12/2019    GLUCOSE 114 02/12/2019    CALCIUM 7.9 02/12/2019        No results  found for: LABA1C   Additional results of the last 24 hours have been reviewed.    Assessment and Plan:         Principal Problem:    Sepsis due to COVID-19 [facility])  Active Problems:    Sinus tachycardia    Acute bronchitis due to COVID-19 virus    Shortness of breath  Resolved Problems:    * No resolved hospital problems. *      1. COVID-19 pneumonia  2. Sepsis POA  3. New leukocytosis --> suspect reactive following stress dose steroids, though new cough and new infiltrate need consideration --> check PCT   4. New acute respiratory insufficiency  5. Hx essential HTN now with soft(ish) BP  6. Transaminitis  7. Normal d dimer  8. Chronic use of prednisone with relative AI --> hemodynamics much better with steroids, holding home antihypertensives and volume expansion  9. Relative adrenal insufficiency  10. Constipation??--> colace started 11/10 and now having adequate BM    Symptom onset 11/6 --> spO2 newly down to 92% today, patient feeling worse clinically and new infiltrate on CXR --> will start remdesivir and convalescent plasma    Information regarding convalescent plasma was communicated to patient consistent with "Fact Sheet for Patients and Parents/Caregivers" prior to patient receiving convalescent plasma (to the extent practicable given the circumstances of the emergency) and Fact [place] for Patients and Parents/Caregivers given to patient, patient was informed of alternatives to receiving convalescent plasma, and patient informed that convalescent is an unapproved treatment that is authorized for use under emergency use authorization (EUA).    I spent over 51% of total time providing  counseling or incoordination of care: > 35 minutes   discussed with nurse, discussed with TCC/SW, discussed with ID, patient and family updated, I personally examined the patient and I personally reviewed chart, data, labs radiology reports    6AM-6PM please page:   Electronically signed by Smith Robert, MD on 02/12/19 at 6:26 AM  EST    6PM-6AM please page:  [facility] Internal Medicine
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
## 2019-02-13 LAB — COMPREHENSIVE METABOLIC PANEL
ALT: 36 U/L — ABNORMAL HIGH (ref 0–34)
AST: 57 U/L — ABNORMAL HIGH (ref 15–46)
Albumin,Serum: 3.3 g/dL — ABNORMAL LOW (ref 3.5–5.0)
Alkaline Phosphatase: 56 U/L (ref 38–126)
Anion Gap: 8 NA
BUN: 21 mg/dL — ABNORMAL HIGH (ref 7–20)
CO2: 28 mmol/L (ref 22–30)
Calcium: 8.1 mg/dL — ABNORMAL LOW (ref 8.4–10.4)
Chloride: 105 mmol/L (ref 98–107)
Creatinine: 0.7 mg/dL (ref 0.52–1.25)
EGFR IF NonAfrican American: 87.6 mL/min (ref >60)
Glucose: 104 mg/dL — ABNORMAL HIGH (ref 70–100)
Potassium: 3.9 mmol/L (ref 3.5–5.1)
Sodium: 141 mmol/L (ref 135–145)
Total Bilirubin: 0.6 mg/dL (ref 0.2–1.3)
Total Protein: 6.5 g/dL (ref 6.3–8.2)
eGFR African American: >90 mL/min (ref >60)

## 2019-02-13 LAB — ADD ON LAB TEST

## 2019-02-13 LAB — CBC
Hematocrit: 36.8 % (ref 35.0–47.0)
Hemoglobin: 12.4 g/dL (ref 11.7–16.0)
MCH: 33.4 pg (ref 26.0–34.0)
MCHC: 33.5 % (ref 32.0–36.0)
MCV: 99.7 fL — ABNORMAL HIGH (ref 79.0–98.0)
MPV: 7.9 fL (ref 7.4–10.4)
Platelets: 255 10*3/uL (ref 140–440)
RBC: 3.69 10*6/uL — ABNORMAL LOW (ref 3.80–5.20)
RDW: 13.2 % (ref 11.5–14.5)
WBC: 13.3 10*3/uL — ABNORMAL HIGH (ref 3.6–10.7)

## 2019-02-13 LAB — PROCALCITONIN
Procalcitonin: <0.1 ng/mL (ref <0.10)
Procalcitonin: <0.1 ng/mL (ref <0.10)

## 2019-02-13 LAB — PREPARE COVID-19 CONVALESCENT PLASMA
Blood Type: 6200 NA
Dispense Status Blood Bank: TRANSFUSED NA
Expiration Date: 202011172138 NA

## 2019-02-13 LAB — MAGNESIUM: Magnesium: 2 mg/dL (ref 1.6–2.3)

## 2019-02-13 LAB — C-REACTIVE PROTEIN: CRP: <5 mg/L (ref 0.0–6.0)

## 2019-02-13 LAB — D-DIMER, QUANTITATIVE: D-Dimer, Quant: 0.67 mg/L — ABNORMAL HIGH (ref 0.00–0.50)

## 2019-02-13 LAB — LACTATE DEHYDROGENASE: LD: 452 U/L — ABNORMAL HIGH (ref 120–246)

## 2019-02-13 LAB — PHOSPHORUS: Phosphorus: 2.4 mg/dL — ABNORMAL LOW (ref 2.5–4.5)

## 2019-02-13 LAB — FERRITIN: Ferritin: 250 ng/mL (ref 8–252)

## 2019-02-13 MED ORDER — CEFTRIAXONE SODIUM 1 G IV SOLR
1 g | INTRAVENOUS | Status: DC
Start: 2019-02-13 — End: 2019-02-16
  Administered 2019-02-13 – 2019-02-16 (×4): 1 g via INTRAVENOUS

## 2019-02-13 MED ORDER — K PHOS MONO-SOD PHOS DI & MONO 155-852-130 MG PO TABS
155-852-130 MG | Freq: Three times a day (TID) | ORAL | Status: DC
Start: 2019-02-13 — End: 2019-02-16
  Administered 2019-02-13 – 2019-02-16 (×11): 1 mg via ORAL

## 2019-02-13 MED ORDER — BENZONATATE 100 MG PO CAPS
100 MG | Freq: Three times a day (TID) | ORAL | Status: DC | PRN
Start: 2019-02-13 — End: 2019-02-16
  Administered 2019-02-13 – 2019-02-16 (×5): 100 mg via ORAL

## 2019-02-13 MED ORDER — DEXAMETHASONE SODIUM PHOSPHATE 4 MG/ML IJ SOLN
4 MG/ML | Freq: Every day | INTRAMUSCULAR | Status: DC
Start: 2019-02-13 — End: 2019-02-16
  Administered 2019-02-13 – 2019-02-16 (×4): 6 mg via INTRAVENOUS

## 2019-02-13 MED FILL — PHOSPHA 250 NEUTRAL 155-852-130 MG PO TABS: 155-852-130 mg | ORAL | Qty: 1

## 2019-02-13 MED FILL — CYCLOBENZAPRINE HCL 10 MG PO TABS: 10 mg | ORAL | Qty: 1

## 2019-02-13 MED FILL — DOK 100 MG PO CAPS: 100 mg | ORAL | Qty: 1

## 2019-02-13 MED FILL — BENZONATATE 100 MG PO CAPS: 100 mg | ORAL | Qty: 1

## 2019-02-13 MED FILL — AMLODIPINE BESYLATE 5 MG PO TABS: 5 mg | ORAL | Qty: 1

## 2019-02-13 MED FILL — DEXAMETHASONE SODIUM PHOSPHATE 4 MG/ML IJ SOLN: 4 mg/mL | INTRAMUSCULAR | Qty: 2

## 2019-02-13 MED FILL — ATORVASTATIN CALCIUM 20 MG PO TABS: 20 mg | ORAL | Qty: 1

## 2019-02-13 MED FILL — VEKLURY 100 MG IV SOLR: 100 mg | INTRAVENOUS | Qty: 100

## 2019-02-13 MED FILL — ALBUTEROL SULFATE HFA 108 (90 BASE) MCG/ACT IN AERS: 108 (90 Base) MCG/ACT | RESPIRATORY_TRACT | Qty: 1.2

## 2019-02-13 MED FILL — ENOXAPARIN SODIUM 40 MG/0.4ML SC SOLN: 40 MG/0.4ML | SUBCUTANEOUS | Qty: 0.4

## 2019-02-13 MED FILL — METOPROLOL TARTRATE 100 MG PO TABS: 100 mg | ORAL | Qty: 1

## 2019-02-13 MED FILL — TAMOXIFEN CITRATE 10 MG PO TABS: 10 mg | ORAL | Qty: 2

## 2019-02-13 MED FILL — BENZTROPINE MESYLATE 0.5 MG PO TABS: 0.5 mg | ORAL | Qty: 1

## 2019-02-13 MED FILL — CEFTRIAXONE SODIUM 1 G IV SOLR: 1 g | INTRAVENOUS | Qty: 1

## 2019-02-13 NOTE — Care Coordination-Inpatient (Signed)
Length of Stay: 2     GMLOS: 4.8     Anticipated date of discharge: 4-5 days    Medical Plan of Care: Remdesivir and Steroid course.     DC Disposition: Home with home care: home care liaison following    Discharge Barriers: No   Per therapy evaluation recommendation is rehab vs HH PT. I called patient in room. Patient declines rehab. Agreeable to [facility] PT. Patient is aware to call her [facility] CM when she gets home so her HHA can resumes services if the [facility] feel comfortable with coming to patients home. Will need HH PT order. Electronically signed by Creed Copper, RN on 02/13/2019 at 11:02 AM

## 2019-02-13 NOTE — Progress Notes (Signed)
[facility] Progress Note        Dawn Padilla  DOB:  1951-03-06(68 y.o.)  MRN:  [id number]    Date: 02/12/19   Subjective:    HPI  The patient complains of fever  fiO2 RA --> spO2 94-96%  No fevers  Hemodynamics adequate and stable  Coughing up more sputum today    Scheduled Meds:  ??? phosphorus  250 mg Oral TID   ??? dexamethasone  6 mg Intravenous Daily   ??? remdesivir IVPB  100 mg Intravenous Q24H   ??? metoprolol tartrate  100 mg Oral Daily   ??? docusate sodium  100 mg Oral BID   ??? sodium chloride flush  3 mL Intravenous Q8H   ??? amLODIPine  5 mg Oral Daily   ??? atorvastatin  20 mg Oral Daily   ??? benztropine  0.5 mg Oral Nightly   ??? cyclobenzaprine  10 mg Oral QPM   ??? fluticasone  1 spray Each Nostril Daily   ??? [Held by provider] hydrALAZINE  25 mg Oral Q12H   ??? [Held by provider] lisinopril  40 mg Oral Daily   ??? [Held by provider] predniSONE  10 mg Oral Daily   ??? tamoxifen  20 mg Oral Daily   ??? enoxaparin  40 mg Subcutaneous Daily     Continuous Infusions:  PRN Meds:sodium chloride, sodium chloride, dicyclomine, gabapentin, albuterol sulfate HFA, acetaminophen **OR** acetaminophen, polyethylene glycol, promethazine **OR** ondansetron, loperamide    Review of Systems   Cardiovascular: Negative for chest pain and palpitations.   Neurological: Negative for light-headedness and headaches.       Interval Pertinent History:  Social History     Tobacco Use   ??? Smoking status: Never Smoker   ??? Smokeless tobacco: Never Used   Substance Use Topics   ??? Alcohol use: No       Objective:     Patient Vitals for the past 24 hrs:   BP Temp Temp src Pulse Resp SpO2   02/13/19 0336 138/70 97.6 ??F (36.4 ??C) Temporal 85 18 95 %   02/13/19 0006 115/69 96.9 ??F (36.1 ??C) Temporal 65 18 94 %   02/12/19 2308 (!) 126/59 96.9 ??F (36.1 ??C) Temporal 66 18 96 %   02/12/19 2306 -- -- Temporal 68 -- 96 %   02/12/19 2233 (!) 120/57 96.7 ??F (35.9 ??C) Temporal 67 18 94 %   02/12/19 2055 134/63 97.6 ??F (36.4 ??C) Temporal 71 18 96 %   02/12/19  1440 (!) 105/51 97.7 ??F (36.5 ??C) Temporal 63 22 92 %   02/12/19 1055 (!) 113/52 98.3 ??F (36.8 ??C) Temporal 77 20 95 %   02/12/19 0815 124/75 97.9 ??F (36.6 ??C) Temporal 83 20 92 %       Average, Min, and Max for last 24 hours Vitals:  TEMPERATURE:  Temp  Avg: 97.5 ??F (36.4 ??C)  Min: 96.7 ??F (35.9 ??C)  Max: 98.3 ??F (36.8 ??C)    RESPIRATIONS RANGE: Resp  Avg: 19  Min: 18  Max: 22    PULSE RANGE: Pulse  Avg: 71.7  Min: 63  Max: 85    BLOOD PRESSURE RANGE:  Systolic (123XX123), XX123456 , Min:105 , 0000000   ; Diastolic (123XX123), 123XX123, Min:51, Max:75      PULSE OXIMETRY RANGE: SpO2  Avg: 94.4 %  Min: 92 %  Max: 96 %    No intake/output data recorded.    Physical Exam  Constitutional:  Appearance: She is well-developed.   HENT:      Head: Normocephalic.   Eyes:      General: No scleral icterus.     Conjunctiva/sclera: Conjunctivae normal.   Neck:      Musculoskeletal: Neck supple.      Vascular: No JVD.   Cardiovascular:      Rate and Rhythm: Normal rate and regular rhythm.      Heart sounds: No murmur. No gallop.    Pulmonary:      Effort: Pulmonary effort is normal.      Breath sounds: No wheezing or rales.   Chest:      Chest wall: No tenderness.   Abdominal:      General: Bowel sounds are normal. There is no distension.      Palpations: Abdomen is soft.      Tenderness: There is no abdominal tenderness.   Musculoskeletal:         General: No tenderness.   Skin:     General: Skin is warm and dry.   Neurological:      Mental Status: She is alert and oriented to person, place, and time.      Cranial Nerves: No cranial nerve deficit.      Coordination: Coordination normal.   Psychiatric:         Judgment: Judgment normal.         Lab Results   Component Value Date    WBC 13.3 (H) 02/13/2019    HGB 12.4 02/13/2019    HCT 36.8 02/13/2019    MCV 99.7 (H) 02/13/2019    PLT 255 02/13/2019     Lab Results   Component Value Date    NA 141 02/13/2019    K 3.9 02/13/2019    CL 105 02/13/2019    CO2 28 02/13/2019    BUN 21  02/13/2019    CREATININE 0.70 02/13/2019    GLUCOSE 104 02/13/2019    CALCIUM 8.1 02/13/2019        No results found for: LABA1C   Additional results of the last 24 hours have been reviewed.    Assessment and Plan:         Principal Problem:    Sepsis due to COVID-19 [facility])  Active Problems:    Sinus tachycardia    Acute bronchitis due to COVID-19 virus    Shortness of breath  Resolved Problems:    * No resolved hospital problems. *      1. COVID-19 pneumonia --> remdesivir since 11/12, s/p plasma 11/12, steroids since 11/10  2. Sepsis POA due to #1  3. New leukocytosis --> now trending down, suspect reactive, procalcitonin <0.10  4. Acute respiratory insufficiency  5. Hx essential HTN now with soft(ish) BP  6. Transaminitis  7. Chronic use of prednisone with relative AI --> hemodynamics much better with steroids, holding home antihypertensives and volume expansion  8. Relative adrenal insufficiency  9. Constipation??--> colace started 11/10 and now having adequate BM  10. Hypophosphatemia     Symptom onset 11/6    Rotate hydrocortisone to dexamethasone  Start kphos PO  With new productive cough and interval infiltrates will cover with ceftriaxone until sputum is obtained    I spent over 51% of total time providing counseling or incoordination of care: > 35 minutes   discussed with nurse, discussed with TCC/SW, discussed with ID, patient and family updated, I personally examined the patient and I personally reviewed chart, data, labs radiology reports  6AM-6PM please page:   Electronically signed by Smith Robert, MD on 02/12/19 at 6:26 AM EST    6PM-6AM please page:  [facility] Internal Medicine

## 2019-02-14 LAB — CBC
Hematocrit: 38.8 % (ref 35.0–47.0)
Hemoglobin: 12.9 g/dL (ref 11.7–16.0)
MCH: 33.1 pg (ref 26.0–34.0)
MCHC: 33.4 % (ref 32.0–36.0)
MCV: 99.1 fL — ABNORMAL HIGH (ref 79.0–98.0)
MPV: 7.5 fL (ref 7.4–10.4)
Platelets: 285 10*3/uL (ref 140–440)
RBC: 3.91 10*6/uL (ref 3.80–5.20)
RDW: 13.2 % (ref 11.5–14.5)
WBC: 12.5 10*3/uL — ABNORMAL HIGH (ref 3.6–10.7)

## 2019-02-14 LAB — COMPREHENSIVE METABOLIC PANEL
ALT: 42 U/L — ABNORMAL HIGH (ref 0–34)
AST: 52 U/L — ABNORMAL HIGH (ref 15–46)
Albumin,Serum: 2.9 g/dL — ABNORMAL LOW (ref 3.5–5.0)
Alkaline Phosphatase: 63 U/L (ref 38–126)
Anion Gap: 6 NA
BUN: 25 mg/dL — ABNORMAL HIGH (ref 7–20)
CO2: 28 mmol/L (ref 22–30)
Calcium: 7.8 mg/dL — ABNORMAL LOW (ref 8.4–10.4)
Chloride: 103 mmol/L (ref 98–107)
Creatinine: 0.68 mg/dL (ref 0.52–1.25)
EGFR IF NonAfrican American: 88.4 mL/min (ref >60)
Glucose: 88 mg/dL (ref 70–100)
Potassium: 3.7 mmol/L (ref 3.5–5.1)
Sodium: 138 mmol/L (ref 135–145)
Total Bilirubin: 0.6 mg/dL (ref 0.2–1.3)
Total Protein: 6 g/dL — ABNORMAL LOW (ref 6.3–8.2)
eGFR African American: >90 mL/min (ref >60)

## 2019-02-14 LAB — D-DIMER, QUANTITATIVE: D-Dimer, Quant: 0.79 mg/L — ABNORMAL HIGH (ref 0.00–0.50)

## 2019-02-14 LAB — C-REACTIVE PROTEIN: CRP: 5.5 mg/L (ref 0.0–6.0)

## 2019-02-14 LAB — PHOSPHORUS: Phosphorus: 3.2 mg/dL (ref 2.5–4.5)

## 2019-02-14 LAB — LACTATE DEHYDROGENASE: LD: 406 U/L — ABNORMAL HIGH (ref 120–246)

## 2019-02-14 LAB — MAGNESIUM: Magnesium: 2 mg/dL (ref 1.6–2.3)

## 2019-02-14 LAB — FERRITIN: Ferritin: 227 ng/mL (ref 8–252)

## 2019-02-14 MED ORDER — GUAIFENESIN ER 600 MG PO TB12
600 MG | Freq: Two times a day (BID) | ORAL | Status: DC
Start: 2019-02-14 — End: 2019-02-16
  Administered 2019-02-14 – 2019-02-16 (×5): 600 mg via ORAL

## 2019-02-14 MED ORDER — ALBUTEROL SULFATE HFA 108 (90 BASE) MCG/ACT IN AERS
108 (90 Base) MCG/ACT | RESPIRATORY_TRACT | Status: DC
Start: 2019-02-14 — End: 2019-02-16
  Administered 2019-02-14 – 2019-02-16 (×8): 2 via RESPIRATORY_TRACT

## 2019-02-14 MED FILL — ACETAMINOPHEN 325 MG PO TABS: 325 mg | ORAL | Qty: 2

## 2019-02-14 MED FILL — BENZTROPINE MESYLATE 0.5 MG PO TABS: 0.5 mg | ORAL | Qty: 1

## 2019-02-14 MED FILL — PHOSPHA 250 NEUTRAL 155-852-130 MG PO TABS: 155-852-130 mg | ORAL | Qty: 1

## 2019-02-14 MED FILL — CYCLOBENZAPRINE HCL 10 MG PO TABS: 10 mg | ORAL | Qty: 1

## 2019-02-14 MED FILL — ALBUTEROL SULFATE HFA 108 (90 BASE) MCG/ACT IN AERS: 108 (90 Base) MCG/ACT | RESPIRATORY_TRACT | Qty: 1.2

## 2019-02-14 MED FILL — DEXAMETHASONE SODIUM PHOSPHATE 4 MG/ML IJ SOLN: 4 mg/mL | INTRAMUSCULAR | Qty: 2

## 2019-02-14 MED FILL — ENOXAPARIN SODIUM 40 MG/0.4ML SC SOLN: 40 MG/0.4ML | SUBCUTANEOUS | Qty: 0.4

## 2019-02-14 MED FILL — CEFTRIAXONE SODIUM 1 G IV SOLR: 1 g | INTRAVENOUS | Qty: 1

## 2019-02-14 MED FILL — AMLODIPINE BESYLATE 5 MG PO TABS: 5 mg | ORAL | Qty: 1

## 2019-02-14 MED FILL — DOK 100 MG PO CAPS: 100 mg | ORAL | Qty: 1

## 2019-02-14 MED FILL — BENZONATATE 100 MG PO CAPS: 100 mg | ORAL | Qty: 1

## 2019-02-14 MED FILL — ATORVASTATIN CALCIUM 20 MG PO TABS: 20 mg | ORAL | Qty: 1

## 2019-02-14 MED FILL — METOPROLOL TARTRATE 100 MG PO TABS: 100 mg | ORAL | Qty: 1

## 2019-02-14 MED FILL — TAMOXIFEN CITRATE 10 MG PO TABS: 10 mg | ORAL | Qty: 2

## 2019-02-14 MED FILL — VEKLURY 100 MG IV SOLR: 100 mg | INTRAVENOUS | Qty: 100

## 2019-02-14 MED FILL — MUCUS RELIEF ER 600 MG PO TB12: 600 mg | ORAL | Qty: 1

## 2019-02-14 NOTE — Progress Notes (Signed)
[facility] Progress Note        TINIE FIELD  DOB:  04/11/1950(68 y.o.)  MRN:  [id number]    Date: 02/12/19   Subjective:      The patient complains of fever  fiO2 RA  No fever  Hemodynamics adequate and stable  Feels incrementally improved today  HA better  Hard for her to expectorate   +BM    Scheduled Meds:  ??? phosphorus  250 mg Oral TID   ??? dexamethasone  6 mg Intravenous Daily   ??? cefTRIAXone (ROCEPHIN) IV  1 g Intravenous Q24H   ??? remdesivir IVPB  100 mg Intravenous Q24H   ??? metoprolol tartrate  100 mg Oral Daily   ??? docusate sodium  100 mg Oral BID   ??? sodium chloride flush  3 mL Intravenous Q8H   ??? amLODIPine  5 mg Oral Daily   ??? atorvastatin  20 mg Oral Daily   ??? benztropine  0.5 mg Oral Nightly   ??? cyclobenzaprine  10 mg Oral QPM   ??? fluticasone  1 spray Each Nostril Daily   ??? [Held by provider] hydrALAZINE  25 mg Oral Q12H   ??? [Held by provider] lisinopril  40 mg Oral Daily   ??? [Held by provider] predniSONE  10 mg Oral Daily   ??? tamoxifen  20 mg Oral Daily   ??? enoxaparin  40 mg Subcutaneous Daily     Continuous Infusions:  PRN Meds:benzonatate, sodium chloride, sodium chloride, dicyclomine, gabapentin, albuterol sulfate HFA, acetaminophen **OR** acetaminophen, polyethylene glycol, promethazine **OR** ondansetron, loperamide    Review of Systems   Cardiovascular: Negative for chest pain and palpitations.   Neurological: Negative for light-headedness and headaches.       Interval Pertinent History:  Social History     Tobacco Use   ??? Smoking status: Never Smoker   ??? Smokeless tobacco: Never Used   Substance Use Topics   ??? Alcohol use: No       Objective:     Patient Vitals for the past 24 hrs:   BP Temp Temp src Pulse Resp SpO2   02/14/19 0749 132/74 98.3 ??F (36.8 ??C) Oral 80 24 90 %   02/14/19 0426 124/70 97.6 ??F (36.4 ??C) Temporal 88 14 96 %   02/13/19 2250 (!) 149/75 97.4 ??F (36.3 ??C) Oral 85 14 96 %   02/13/19 1949 118/66 97.3 ??F (36.3 ??C) Oral 75 14 100 %   02/13/19 1454 (!) 106/56 98.3  ??F (36.8 ??C) Temporal 79 18 94 %   02/13/19 1132 135/67 96.6 ??F (35.9 ??C) Temporal 69 18 98 %       Average, Min, and Max for last 24 hours Vitals:  TEMPERATURE:  Temp  Avg: 97.6 ??F (36.4 ??C)  Min: 96.6 ??F (35.9 ??C)  Max: 98.3 ??F (36.8 ??C)    RESPIRATIONS RANGE: Resp  Avg: 17  Min: 14  Max: 24    PULSE RANGE: Pulse  Avg: 79.3  Min: 69  Max: 88    BLOOD PRESSURE RANGE:  Systolic (123XX123), [id number] , Min:106 , 123XX123   ; Diastolic (123XX123), [id number], Min:56, Max:75      PULSE OXIMETRY RANGE: SpO2  Avg: 95.7 %  Min: 90 %  Max: 100 %    No intake/output data recorded.    Physical Exam  Constitutional:       Appearance: She is well-developed.   HENT:      Head: Normocephalic.   Eyes:  General: No scleral icterus.     Conjunctiva/sclera: Conjunctivae normal.   Neck:      Musculoskeletal: Neck supple.      Vascular: No JVD.   Cardiovascular:      Rate and Rhythm: Normal rate and regular rhythm.      Heart sounds: No murmur. No gallop.    Pulmonary:      Effort: Pulmonary effort is normal.      Breath sounds: No wheezing or rales.   Chest:      Chest wall: No tenderness.   Abdominal:      General: Bowel sounds are normal. There is no distension.      Palpations: Abdomen is soft.      Tenderness: There is no abdominal tenderness.   Musculoskeletal:         General: No tenderness.   Skin:     General: Skin is warm and dry.   Neurological:      Mental Status: She is alert and oriented to person, place, and time.      Cranial Nerves: No cranial nerve deficit.      Coordination: Coordination normal.   Psychiatric:         Judgment: Judgment normal.         Lab Results   Component Value Date    WBC 12.5 (H) 02/14/2019    HGB 12.9 02/14/2019    HCT 38.8 02/14/2019    MCV 99.1 (H) 02/14/2019    PLT 285 02/14/2019     Lab Results   Component Value Date    NA 138 02/14/2019    K 3.7 02/14/2019    CL 103 02/14/2019    CO2 28 02/14/2019    BUN 25 02/14/2019    CREATININE 0.68 02/14/2019    GLUCOSE 88 02/14/2019    CALCIUM 7.8  02/14/2019        No results found for: LABA1C   Additional results of the last 24 hours have been reviewed.    Assessment and Plan:         Principal Problem:    Sepsis due to COVID-19 [facility])  Active Problems:    Sinus tachycardia    Acute bronchitis due to COVID-19 virus    Shortness of breath  Resolved Problems:    * No resolved hospital problems. *      1. COVID-19 pneumonia --> remdesivir since 11/12, s/p plasma 11/12, steroids since 11/10  2. Sepsis POA due to #1  3. Possible secondary bacterial pneumonia --> empiric ceftriaxone started 11/13  4. Acute respiratory insufficiency  5. Hx essential HTN now with soft(ish) BP --> holding home hydralazine and lisinopril  6. Transaminitis  7. Chronic use of prednisone with relative AI --> hemodynamics much better with steroids, holding home antihypertensives and volume expansion  8. Relative adrenal insufficiency  9. Constipation??--> colace started 11/10 and now having adequate BM  10. Hypophosphatemia --> improving on supplement    Symptom onset 11/6    Add scheduled SABA, mucinex, IS  Otherwise continue current care    I spent over 51% of total time providing counseling or incoordination of care: > 35 minutes   discussed with nurse, discussed with TCC/SW, discussed with ID, patient and family updated, I personally examined the patient and I personally reviewed chart, data, labs radiology reports    6AM-6PM please page:   Electronically signed by Smith Robert, MD on 02/12/19 at 6:26 AM EST    6PM-6AM please page:  [facility] Internal  Medicine

## 2019-02-15 LAB — CBC
Hematocrit: 38.4 % (ref 35.0–47.0)
Hemoglobin: 13 g/dL (ref 11.7–16.0)
MCH: 33.4 pg (ref 26.0–34.0)
MCHC: 33.9 % (ref 32.0–36.0)
MCV: 98.6 fL — ABNORMAL HIGH (ref 79.0–98.0)
MPV: 7.3 fL — ABNORMAL LOW (ref 7.4–10.4)
Platelets: 296 10*3/uL (ref 140–440)
RBC: 3.9 10*6/uL (ref 3.80–5.20)
RDW: 13.4 % (ref 11.5–14.5)
WBC: 14.5 10*3/uL — ABNORMAL HIGH (ref 3.6–10.7)

## 2019-02-15 LAB — COMPREHENSIVE METABOLIC PANEL
ALT: 47 U/L — ABNORMAL HIGH (ref 0–34)
AST: 51 U/L — ABNORMAL HIGH (ref 15–46)
Albumin,Serum: 2.9 g/dL — ABNORMAL LOW (ref 3.5–5.0)
Alkaline Phosphatase: 61 U/L (ref 38–126)
Anion Gap: 5 NA
BUN: 23 mg/dL — ABNORMAL HIGH (ref 7–20)
CO2: 30 mmol/L (ref 22–30)
Calcium: 7.8 mg/dL — ABNORMAL LOW (ref 8.4–10.4)
Chloride: 104 mmol/L (ref 98–107)
Creatinine: 0.68 mg/dL (ref 0.52–1.25)
EGFR IF NonAfrican American: 88.4 mL/min (ref >60)
Glucose: 87 mg/dL (ref 70–100)
Potassium: 3.9 mmol/L (ref 3.5–5.1)
Sodium: 139 mmol/L (ref 135–145)
Total Bilirubin: 0.5 mg/dL (ref 0.2–1.3)
Total Protein: 5.8 g/dL — ABNORMAL LOW (ref 6.3–8.2)
eGFR African American: >90 mL/min (ref >60)

## 2019-02-15 LAB — D-DIMER, QUANTITATIVE: D-Dimer, Quant: 0.87 mg/L — ABNORMAL HIGH (ref 0.00–0.50)

## 2019-02-15 LAB — FERRITIN: Ferritin: 211 ng/mL (ref 8–252)

## 2019-02-15 LAB — MAGNESIUM: Magnesium: 2 mg/dL (ref 1.6–2.3)

## 2019-02-15 LAB — PHOSPHORUS: Phosphorus: 3.7 mg/dL (ref 2.5–4.5)

## 2019-02-15 LAB — LACTATE DEHYDROGENASE: LD: 416 U/L — ABNORMAL HIGH (ref 120–246)

## 2019-02-15 LAB — C-REACTIVE PROTEIN: CRP: 6.4 mg/L — ABNORMAL HIGH (ref 0.0–6.0)

## 2019-02-15 MED FILL — PHOSPHA 250 NEUTRAL 155-852-130 MG PO TABS: 155-852-130 mg | ORAL | Qty: 1

## 2019-02-15 MED FILL — CYCLOBENZAPRINE HCL 10 MG PO TABS: 10 mg | ORAL | Qty: 1

## 2019-02-15 MED FILL — CEFTRIAXONE SODIUM 1 G IV SOLR: 1 g | INTRAVENOUS | Qty: 1

## 2019-02-15 MED FILL — DOK 100 MG PO CAPS: 100 mg | ORAL | Qty: 1

## 2019-02-15 MED FILL — DEXAMETHASONE SODIUM PHOSPHATE 4 MG/ML IJ SOLN: 4 mg/mL | INTRAMUSCULAR | Qty: 2

## 2019-02-15 MED FILL — MUCUS RELIEF ER 600 MG PO TB12: 600 mg | ORAL | Qty: 1

## 2019-02-15 MED FILL — METOPROLOL TARTRATE 100 MG PO TABS: 100 mg | ORAL | Qty: 1

## 2019-02-15 MED FILL — TAMOXIFEN CITRATE 10 MG PO TABS: 10 mg | ORAL | Qty: 2

## 2019-02-15 MED FILL — ACETAMINOPHEN 325 MG PO TABS: 325 mg | ORAL | Qty: 2

## 2019-02-15 MED FILL — ENOXAPARIN SODIUM 40 MG/0.4ML SC SOLN: 40 MG/0.4ML | SUBCUTANEOUS | Qty: 0.4

## 2019-02-15 MED FILL — BENZONATATE 100 MG PO CAPS: 100 mg | ORAL | Qty: 1

## 2019-02-15 MED FILL — AMLODIPINE BESYLATE 5 MG PO TABS: 5 mg | ORAL | Qty: 1

## 2019-02-15 MED FILL — ATORVASTATIN CALCIUM 20 MG PO TABS: 20 mg | ORAL | Qty: 1

## 2019-02-15 MED FILL — BENZTROPINE MESYLATE 0.5 MG PO TABS: 0.5 mg | ORAL | Qty: 1

## 2019-02-15 MED FILL — VEKLURY 100 MG IV SOLR: 100 mg | INTRAVENOUS | Qty: 100

## 2019-02-15 NOTE — Progress Notes (Addendum)
[facility] Progress Note        Dawn Padilla  DOB:  1950-06-25(68 y.o.)  MRN:  [id number]    Date: 02/12/19   Subjective:      The patient complains of fever  fiO2 RA  No fever  Hemodynamics adequate and stable  Continues to feel better  +ambulating   Chest congestion beginning to loosen     Scheduled Meds:  ??? guaiFENesin  600 mg Oral BID   ??? albuterol sulfate HFA  2 puff Inhalation Q4H WA   ??? phosphorus  250 mg Oral TID   ??? dexamethasone  6 mg Intravenous Daily   ??? cefTRIAXone (ROCEPHIN) IV  1 g Intravenous Q24H   ??? remdesivir IVPB  100 mg Intravenous Q24H   ??? metoprolol tartrate  100 mg Oral Daily   ??? docusate sodium  100 mg Oral BID   ??? sodium chloride flush  3 mL Intravenous Q8H   ??? amLODIPine  5 mg Oral Daily   ??? atorvastatin  20 mg Oral Daily   ??? benztropine  0.5 mg Oral Nightly   ??? cyclobenzaprine  10 mg Oral QPM   ??? fluticasone  1 spray Each Nostril Daily   ??? [Held by provider] hydrALAZINE  25 mg Oral Q12H   ??? [Held by provider] lisinopril  40 mg Oral Daily   ??? [Held by provider] predniSONE  10 mg Oral Daily   ??? tamoxifen  20 mg Oral Daily   ??? enoxaparin  40 mg Subcutaneous Daily     Continuous Infusions:  PRN Meds:benzonatate, sodium chloride, sodium chloride, dicyclomine, gabapentin, albuterol sulfate HFA, acetaminophen **OR** acetaminophen, polyethylene glycol, promethazine **OR** ondansetron, loperamide    Review of Systems   Cardiovascular: Negative for chest pain and palpitations.   Neurological: Negative for light-headedness and headaches.       Interval Pertinent History:  Social History     Tobacco Use   ??? Smoking status: Never Smoker   ??? Smokeless tobacco: Never Used   Substance Use Topics   ??? Alcohol use: No       Objective:     Patient Vitals for the past 24 hrs:   BP Temp Temp src Pulse Resp SpO2   02/15/19 0359 116/65 99 ??F (37.2 ??C) Temporal 85 18 93 %   02/15/19 0045 130/80 97.6 ??F (36.4 ??C) Temporal 87 17 98 %   02/14/19 2000 116/60 97.6 ??F (36.4 ??C) Temporal 81 16 90 %    02/14/19 1539 (!) 125/56 97.8 ??F (36.6 ??C) Temporal 63 18 92 %   02/14/19 1212 (!) 116/58 98 ??F (36.7 ??C) Oral 64 24 94 %       Average, Min, and Max for last 24 hours Vitals:  TEMPERATURE:  Temp  Avg: 98 ??F (36.7 ??C)  Min: 97.6 ??F (36.4 ??C)  Max: 99 ??F (37.2 ??C)    RESPIRATIONS RANGE: Resp  Avg: 18.6  Min: 16  Max: 24    PULSE RANGE: Pulse  Avg: 76  Min: 63  Max: 87    BLOOD PRESSURE RANGE:  Systolic (123XX123), [id number] , Min:116 , 123456   ; Diastolic (123XX123), [id number], Min:56, Max:80      PULSE OXIMETRY RANGE: SpO2  Avg: 93.4 %  Min: 90 %  Max: 98 %    No intake/output data recorded.    Due to the current efforts to prevent transmission of COVID-19 and also the need to preserve PPE for other caregivers, a face-to-face encounter with  the patient was not performed. That being said, all relevant records and diagnostic tests were reviewed, including laboratory results and imaging. Please reference any relevant documentation elsewhere. Care will be coordinated with the primary service.    Lab Results   Component Value Date    WBC 14.5 (H) 02/15/2019    HGB 13.0 02/15/2019    HCT 38.4 02/15/2019    MCV 98.6 (H) 02/15/2019    PLT 296 02/15/2019     Lab Results   Component Value Date    NA 139 02/15/2019    K 3.9 02/15/2019    CL 104 02/15/2019    CO2 30 02/15/2019    BUN 23 02/15/2019    CREATININE 0.68 02/15/2019    GLUCOSE 87 02/15/2019    CALCIUM 7.8 02/15/2019        No results found for: LABA1C   Additional results of the last 24 hours have been reviewed.    Assessment and Plan:         Principal Problem:    Sepsis due to COVID-19 [facility])  Active Problems:    Sinus tachycardia    Acute bronchitis due to COVID-19 virus    Shortness of breath  Resolved Problems:    * No resolved hospital problems. *      1. COVID-19 pneumonia --> remdesivir since 11/12, s/p plasma 11/12, steroids since 11/10  2. Sepsis POA due to #1  3. Possible secondary bacterial pneumonia --> empiric ceftriaxone started 11/13  4. Acute respiratory  insufficiency  5. Hx essential HTN now normotensive despite holding home hydralazine and lisinopril --> continue to hold --> would expect BP to slowly trend up as she recovers from her acute illness and will resume home meds as indicated  6. Transaminitis  7. Chronic use of prednisone with relative AI --> hemodynamics much better with steroids, holding home antihypertensives and volume expansion  8. Relative adrenal insufficiency  9. Constipation??--> colace started 11/10 and now having adequate BM  10. Hypophosphatemia --> improving on supplement --> may be able to stop in a day or two    Symptom onset 11/6    Doing well  Cough and chest congestion have improved with empiric ceftriaxone, SABA, mucinex, IS  Continue current care  Favor another PT evaluation in the next day or two    I spent over 51% of total time providing counseling or incoordination of care: > 35 minutes   discussed with nurse, discussed with TCC/SW, discussed with ID, patient and family updated, I personally examined the patient and I personally reviewed chart, data, labs radiology reports    6AM-6PM please page:   Electronically signed by Smith Robert, MD on 02/12/19 at 6:26 AM EST    6PM-6AM please page:  [facility] Internal Medicine

## 2019-02-16 LAB — CBC
Hematocrit: 37.1 % (ref 35.0–47.0)
Hemoglobin: 12.3 g/dL (ref 11.7–16.0)
MCH: 32.8 pg (ref 26.0–34.0)
MCHC: 33.3 % (ref 32.0–36.0)
MCV: 98.6 fL — ABNORMAL HIGH (ref 79.0–98.0)
MPV: 7.2 fL — ABNORMAL LOW (ref 7.4–10.4)
Platelets: 323 10*3/uL (ref 140–440)
RBC: 3.76 10*6/uL — ABNORMAL LOW (ref 3.80–5.20)
RDW: 13.5 % (ref 11.5–14.5)
WBC: 17.3 10*3/uL — ABNORMAL HIGH (ref 3.6–10.7)

## 2019-02-16 LAB — MAGNESIUM: Magnesium: 2.1 mg/dL (ref 1.6–2.3)

## 2019-02-16 LAB — C-REACTIVE PROTEIN: CRP: 8.6 mg/L — ABNORMAL HIGH (ref 0.0–6.0)

## 2019-02-16 LAB — LACTATE DEHYDROGENASE: LD: 377 U/L — ABNORMAL HIGH (ref 120–246)

## 2019-02-16 LAB — COMPREHENSIVE METABOLIC PANEL
ALT: 48 U/L — ABNORMAL HIGH (ref 0–34)
AST: 42 U/L (ref 15–46)
Albumin,Serum: 2.8 g/dL — ABNORMAL LOW (ref 3.5–5.0)
Alkaline Phosphatase: 65 U/L (ref 38–126)
Anion Gap: 7 NA
BUN: 25 mg/dL — ABNORMAL HIGH (ref 7–20)
CO2: 28 mmol/L (ref 22–30)
Calcium: 8 mg/dL — ABNORMAL LOW (ref 8.4–10.4)
Chloride: 104 mmol/L (ref 98–107)
Creatinine: 0.71 mg/dL (ref 0.52–1.25)
EGFR IF NonAfrican American: 86.1 mL/min (ref >60)
Glucose: 83 mg/dL (ref 70–100)
Potassium: 4 mmol/L (ref 3.5–5.1)
Sodium: 138 mmol/L (ref 135–145)
Total Bilirubin: 0.5 mg/dL (ref 0.2–1.3)
Total Protein: 5.8 g/dL — ABNORMAL LOW (ref 6.3–8.2)
eGFR African American: >90 mL/min (ref >60)

## 2019-02-16 LAB — FERRITIN: Ferritin: 187 ng/mL (ref 8–252)

## 2019-02-16 LAB — PHOSPHORUS: Phosphorus: 4.2 mg/dL (ref 2.5–4.5)

## 2019-02-16 LAB — D-DIMER, QUANTITATIVE: D-Dimer, Quant: 0.51 mg/L — ABNORMAL HIGH (ref 0.00–0.50)

## 2019-02-16 MED ORDER — PREDNISONE 10 MG PO TABS
10 MG | ORAL_TABLET | ORAL | 0 refills | Status: AC
Start: 2019-02-16 — End: 2019-02-20

## 2019-02-16 MED ORDER — DSS 100 MG PO CAPS
100 MG | ORAL_CAPSULE | Freq: Two times a day (BID) | ORAL | 0 refills | Status: AC | PRN
Start: 2019-02-16 — End: ?

## 2019-02-16 MED FILL — ENOXAPARIN SODIUM 40 MG/0.4ML SC SOLN: 40 MG/0.4ML | SUBCUTANEOUS | Qty: 0.4

## 2019-02-16 MED FILL — AMLODIPINE BESYLATE 5 MG PO TABS: 5 mg | ORAL | Qty: 1

## 2019-02-16 MED FILL — MUCUS RELIEF ER 600 MG PO TB12: 600 mg | ORAL | Qty: 1

## 2019-02-16 MED FILL — ACETAMINOPHEN 325 MG PO TABS: 325 mg | ORAL | Qty: 2

## 2019-02-16 MED FILL — VEKLURY 100 MG IV SOLR: 100 mg | INTRAVENOUS | Qty: 100

## 2019-02-16 MED FILL — DOK 100 MG PO CAPS: 100 mg | ORAL | Qty: 1

## 2019-02-16 MED FILL — PHOSPHA 250 NEUTRAL 155-852-130 MG PO TABS: 155-852-130 mg | ORAL | Qty: 1

## 2019-02-16 MED FILL — BENZONATATE 100 MG PO CAPS: 100 mg | ORAL | Qty: 1

## 2019-02-16 MED FILL — CEFTRIAXONE SODIUM 1 G IV SOLR: 1 g | INTRAVENOUS | Qty: 1

## 2019-02-16 MED FILL — BENZTROPINE MESYLATE 0.5 MG PO TABS: 0.5 mg | ORAL | Qty: 1

## 2019-02-16 MED FILL — DEXAMETHASONE SODIUM PHOSPHATE 4 MG/ML IJ SOLN: 4 mg/mL | INTRAMUSCULAR | Qty: 2

## 2019-02-16 MED FILL — METOPROLOL TARTRATE 100 MG PO TABS: 100 mg | ORAL | Qty: 1

## 2019-02-16 MED FILL — ATORVASTATIN CALCIUM 20 MG PO TABS: 20 mg | ORAL | Qty: 1

## 2019-02-16 MED FILL — TAMOXIFEN CITRATE 10 MG PO TABS: 10 mg | ORAL | Qty: 2

## 2019-02-16 MED FILL — CYCLOBENZAPRINE HCL 10 MG PO TABS: 10 mg | ORAL | Qty: 1

## 2019-02-16 NOTE — Discharge Summary (Signed)
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 *      HOSPITAL COURSE:    68 yo female who is PCR confirmed COVID-19 positive presented to the ED complaining of fevers.    1. COVID-19 pneumonia --> remdesivir since 11/12, s/p plasma 11/12, steroids since 11/10  2. Sepsis POA due to #1  3. Possible secondary bacterial pneumonia --> empiric ceftriaxone started 11/13  4. Acute respiratory insufficiency  5. Hx essential HTN now normotensive despite holding home hydralazine and lisinopril --> continue to hold --> would expect BP to slowly trend up as she recovers from her acute illness and will resume home meds as indicated  6. Transaminitis  7. Chronic use of prednisone??with relative AI --> hemodynamics much better with stress dose steroids, holding home antihypertensives and volume expansion  8. Relative adrenal insufficiency  9. Constipation??--> colace started 11/10 and now having adequate BM    She is discharged to home on room air in improved condition.  Short taper prednisone back to her chronic 10mg  daily.  Continue to hold home hydralazine and lisinopril and self-monitor BP --> to call PCP for instructions if systolic BP becomes elevated.    DAY OF DISCHARGE:  Review of Systems   Respiratory: Negative for cough and shortness of breath.    Cardiovascular: Negative for chest pain and palpitations.       No data found.    Average, Min, and Max forlast 24 hours  Vitals:  TEMPERATURE:  No data recorded    RESPIRATIONS RANGE: No data recorded    PULSE RANGE: No data recorded    BLOOD PRESSURE RANGE:  No data recorded.  ; No data recorded.      PULSE OXIMETRYRANGE: No data recorded    No intake/output data recorded.    Physical Exam  Constitutional:       Appearance: She is well-developed.   HENT:      Head: Normocephalic.   Eyes:      General: No scleral icterus.     Conjunctiva/sclera: Conjunctivae normal.   Neck:      Musculoskeletal: Neck supple.      Vascular: No JVD.   Cardiovascular:      Rate and Rhythm: Normal rate and regular rhythm.      Heart sounds: No murmur. No gallop.    Pulmonary:      Effort: Pulmonary effort is normal.      Breath sounds: No wheezing or rales.   Chest:      Chest wall: No tenderness.   Abdominal:      General: Bowel sounds are normal. There is no distension.      Palpations: Abdomen is soft.      Tenderness: There is no abdominal tenderness.   Musculoskeletal:  General: No tenderness.   Skin:     General: Skin is warm and dry.   Neurological:      Mental Status: She is alert and oriented to person, place, and time.      Cranial Nerves: No cranial nerve deficit.      Coordination: Coordination normal.   Psychiatric:         Judgment: Judgment normal.         PROCEDURES:  n/a    CONSULTANTS:  n/a    DISCHARGE MEDICATIONS:        Significant Medication Changes:   Hold lisinopril       Hold hydralazine       Start colace       Prednisone taper     Dawn Padilla, Dawn Padilla   Home Medication Instructions [id number]    Printed on:02/17/19 1611   Medication Information                      acetaminophen (TYLENOL) 500 MG tablet  Take 1,000 mg by mouth every 6 hours as needed for Pain             alendronate (FOSAMAX) 70 MG tablet  TAKE ONE TABLET BY MOUTH ONCE A WEEK ON MONDAY             amLODIPine (NORVASC) 5 MG tablet  Take 5 mg by mouth daily             atorvastatin (LIPITOR) 20 MG tablet  Take 20 mg by mouth daily              benztropine (COGENTIN) 0.5 MG tablet  Take 0.5 mg by mouth nightly              Breast Prosthesis MISC  by Does not apply route Bilateral mastectomies             Calcium Carb-Cholecalciferol (CALCIUM + D3) 600-200 MG-UNIT TABS  Take 1 tablet by mouth daily              cephALEXin (KEFLEX) 250 MG capsule  TAKE TWO CAPSULES BY MOUTH EVERY TWELVE HOURS             cyclobenzaprine (FLEXERIL) 10 MG tablet  Take 10 mg by mouth every evening              dicyclomine (BENTYL) 20 MG tablet  Take 20 mg by mouth 4 times daily as needed             docusate sodium (COLACE, DULCOLAX) 100 MG CAPS  Take 100 mg by mouth 2 times daily as needed for Constipation             fluticasone (FLONASE) 50 MCG/ACT nasal spray  INTILL ONE SPRAY IN EACH nostril ONCE A DAY Nasally             gabapentin (NEURONTIN) 100 MG capsule  Take 100 mg by mouth nightly as needed.              loperamide (IMODIUM) 2 MG capsule  Take 2 mg by mouth as needed for Diarrhea             metoprolol (LOPRESSOR) 100 MG tablet  Take 100 mg by mouth daily              NATURAL VITAMIN D-3 125 MCG (5000 UT) TABS tablet  TAKE ONE TABLET BY MOUTH EVERY DAY  predniSONE (DELTASONE) 10 MG tablet  TAKE ONE TABLET BY MOUTH EVERY DAY             predniSONE (DELTASONE) 10 MG tablet  Take 3 tablets by mouth daily for 2 days, THEN 2 tablets daily for 2 days.             PROAIR HFA 108 (90 Base) MCG/ACT inhaler  INHALE TWO PUFFS EVERY 4 HOURS AS NEEDED             promethazine (PHENERGAN) 25 MG tablet  every 4 hours as needed              Surgical Bra MISC  by Does not apply route S/p bilateral mastectomies             tamoxifen (NOLVADEX) 20 MG tablet  Take 1 tablet by mouth daily                 DIET: regular    ACTIVITY: resume regular activity    COMPLEXITY OF FOLLOW UP:   []  Moderate Complexity: follow up within 7-14 calendar days [id number])   [x]  Severe Complexity: follow up within 7 calendar days [id number])    FOLLOW UP TESTING, PENDING RESULTS ORREFERRALS AT  TRANSITIONAL CARE VISIT:    []   Yes    [x]   No    DISPOSITION:  Home with home care    Follow up with Dawn Brown, DO to be scheduled .    Notification (telephone encounter) to PCP initiated:   []   Yes    []   No    INSTRUCTIONS TO MA/SW: Please call patient on day after discharge (must document patient  contacted within 2 business days of discharge).    FOLLOW UP QUESTIONS FOR MA/SW:  1. Did you get medications filled and takingthem as instructed from discharge?  2. Are you following your discharge instructions from your hospital stay?  3. Please confirm patient is scheduled for a follow up appointment within the above time frame.    DISCHARGE TIME: > 30 minutes    Electronically signed by Smith Robert, MD on 02/17/19 at 4:11 PM EST

## 2019-02-16 NOTE — Care Coordination-Inpatient (Signed)
Patient requesting transfer to home. Transfer set up with [facility] [place] notified of plan to discharge home today. Patient reminded to call [employer] CM also once she get home to get services resumed. Electronically signed by Adrian Saran, RN on 02/16/2019 at 2:01 PM

## 2019-02-16 NOTE — Other (Unsigned)
Patient Acct Nbr: [id number]   Primary AUTH/CERT:   [place] Name: [place] name: [facility] Dual Complete  Primary Insurance Group Number: [employer]  Primary Insurance Plan Type: Health  Primary Insurance Policy Number: [id number]    Secondary AUTH/CERT:   [place] Name: [employer]  Secondary Insurance Plan name: [employer] IME  Secondary Insurance Group Number:   Secondary Insurance Plan Type: IME  Secondary Insurance Policy Number: [id number]    Tertiary AUTH/CERT:   [place] Name: [employer] Plan name: [facility] MyCrMcaid Second  [employer] Group Number: [employer] Insurance Plan Type: [employer] Number: [id number]

## 2019-02-16 NOTE — Progress Notes (Signed)
Nutrition update completed. Chart reviewed. Patient to be monitored and followed by the diet technician.

## 2019-02-17 ENCOUNTER — Encounter: Attending: Hematology & Oncology | Primary: Family Medicine

## 2019-02-17 NOTE — Telephone Encounter (Signed)
Smith Robert, MD    Physician    Internal Medicine    Discharge Summary    Signed    Date of Service:  02/16/2019 ??6:54 PM                [facility] Discharge Summary with   Discharge DayProgress Note and Transition Note                ??  Dawn Padilla                     DOB: Feb 19, 1951                        MRN:  [id number]  ??  ADMIT DATE: 02/09/2019                   DISCHARGE DATE:  02/16/2019  ??  PRIMARYCARE PHYSICIAN:  America Brown, DO  ??  VISIT STATUS: Admission  ??  CODE STATUS:  Prior  ??  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 *  ??  ??  HOSPITAL COURSE:  ??  68 yo female who is PCR confirmed COVID-19 positive presented to the ED complaining of fevers.  ??  1. COVID-19 pneumonia --> remdesivir since 11/12, s/p plasma 11/12, steroids since 11/10  2. Sepsis POA due to #1  3. Possible secondary bacterial pneumonia --> empiric ceftriaxone started 11/13  4. Acute respiratory insufficiency  5. Hx essential HTN now??normotensive despite??holding home hydralazine and lisinopril??--> continue to hold --> would expect BP to slowly trend up as she recovers from her acute illness and will resume home meds as indicated  6. Transaminitis  7. Chronic use of prednisone??with relative AI --> hemodynamics much better with stress dose steroids, holding home antihypertensives and volume expansion  8. Relative adrenal insufficiency  9. Constipation??--> colace started 11/10 and now having adequate BM  ??  She is discharged to home on room air in improved condition.  Short taper prednisone back to her chronic 10mg  daily.  Continue to hold home hydralazine and lisinopril and self-monitor BP --> to call PCP for instructions if systolic BP becomes  elevated.  ??  DAY OF DISCHARGE:  Review of Systems   Respiratory: Negative for cough and shortness of breath.    Cardiovascular: Negative for chest pain and palpitations.   ??  ??  No data found.  ??  Average, Min, and Max forlast 24 hours Vitals:  TEMPERATURE:  No data recorded  ??  RESPIRATIONS RANGE: No data recorded  ??  PULSE RANGE: No data recorded  ??  BLOOD PRESSURE RANGE:  No data recorded.  ; No data recorded.  ??  ??  PULSE OXIMETRYRANGE: No data recorded  ??  No intake/output data recorded.  ??  Physical Exam  Constitutional:       Appearance: She is well-developed.   HENT:      Head: Normocephalic.   Eyes:      General: No scleral icterus.     Conjunctiva/sclera: Conjunctivae normal.   Neck:  Musculoskeletal: Neck supple.      Vascular: No JVD.   Cardiovascular:      Rate and Rhythm: Normal rate and regular rhythm.      Heart sounds: No murmur. No gallop.    Pulmonary:      Effort: Pulmonary effort is normal.      Breath sounds: No wheezing or rales.   Chest:      Chest wall: No tenderness.   Abdominal:      General: Bowel sounds are normal. There is no distension.      Palpations: Abdomen is soft.      Tenderness: There is no abdominal tenderness.   Musculoskeletal:         General: No tenderness.   Skin:     General: Skin is warm and dry.   Neurological:      Mental Status: She is alert and oriented to person, place, and time.      Cranial Nerves: No cranial nerve deficit.      Coordination: Coordination normal.   Psychiatric:         Judgment: Judgment normal.   ??  ??  ??  PROCEDURES:  n/a  ??  CONSULTANTS:  n/a  ??  DISCHARGE MEDICATIONS:      ??  Significant Medication Changes:   Hold lisinopril                                                              Hold hydralazine                                                              Start colace                                                              Prednisone taper  ??              ?? Anntoinette, In[place]   Home Medication Instructions [id number]   ??  Printed on:02/17/19 1611   Medication Information ?? ?? ?? ?? ?? ?? ?? ??   ?? ?? ?? ?? ?? ?? ?? ?? ??   acetaminophen (TYLENOL) 500 MG tablet  Take 1,000 mg by mouth every 6 hours as needed for Pain  ?? ?? ?? ?? ?? ?? ?? ?? ??   alendronate (FOSAMAX) 70 MG tablet  TAKE ONE TABLET BY MOUTH ONCE A WEEK ON MONDAY  ?? ?? ?? ?? ?? ?? ?? ?? ??   amLODIPine (NORVASC) 5 MG tablet  Take 5 mg by mouth daily  ?? ?? ?? ?? ?? ?? ?? ?? ??   atorvastatin (LIPITOR) 20 MG tablet  Take 20 mg by mouth daily  ?? ?? ?? ?? ?? ?? ?? ?? ??   benztropine (COGENTIN) 0.5 MG tablet  Take 0.5 mg by mouth nightly   ?? ?? ?? ?? ?? ?? ?? ?? ??  Breast Prosthesis MISC  by Does not apply route Bilateral mastectomies  ?? ?? ?? ?? ?? ?? ?? ?? ??   Calcium Carb-Cholecalciferol (CALCIUM + D3) 600-200 MG-UNIT TABS  Take 1 tablet by mouth daily   ?? ?? ?? ?? ?? ?? ?? ?? ??   cephALEXin (KEFLEX) 250 MG capsule  TAKE TWO CAPSULES BY MOUTH EVERY TWELVE HOURS  ?? ?? ?? ?? ?? ?? ?? ?? ??   cyclobenzaprine (FLEXERIL) 10 MG tablet  Take 10 mg by mouth every evening   ?? ?? ?? ?? ?? ?? ?? ?? ??   dicyclomine (BENTYL) 20 MG tablet  Take 20 mg by mouth 4 times daily as needed  ?? ?? ?? ?? ?? ?? ?? ?? ??   docusate sodium (COLACE, DULCOLAX) 100 MG CAPS  Take 100 mg by mouth 2 times daily as needed for Constipation  ?? ?? ?? ?? ?? ?? ?? ?? ??   fluticasone (FLONASE) 50 MCG/ACT nasal spray  INTILL ONE SPRAY IN EACH nostril ONCE A DAY Nasally  ?? ?? ?? ?? ?? ?? ?? ?? ??   gabapentin (NEURONTIN) 100 MG capsule  Take 100 mg by mouth nightly as needed.   ?? ?? ?? ?? ?? ?? ?? ?? ??   loperamide (IMODIUM) 2 MG capsule  Take 2 mg by mouth as needed for Diarrhea  ?? ?? ?? ?? ?? ?? ?? ?? ??   metoprolol (LOPRESSOR) 100 MG tablet  Take 100 mg by mouth daily   ?? ?? ?? ?? ?? ?? ?? ?? ??   NATURAL VITAMIN D-3 125 MCG (5000 UT) TABS tablet  TAKE ONE TABLET BY MOUTH EVERY DAY  ?? ?? ?? ?? ?? ?? ?? ?? ??   predniSONE (DELTASONE) 10 MG tablet  TAKE ONE TABLET BY MOUTH EVERY DAY  ?? ?? ?? ?? ?? ?? ?? ?? ??   predniSONE (DELTASONE) 10 MG tablet  Take 3 tablets by mouth daily for 2 days, THEN 2 tablets daily for 2 days.  ?? ?? ?? ?? ?? ?? ?? ?? ??   PROAIR HFA  108 (90 Base) MCG/ACT inhaler  INHALE TWO PUFFS EVERY 4 HOURS AS NEEDED  ?? ?? ?? ?? ?? ?? ?? ?? ??   promethazine (PHENERGAN) 25 MG tablet  every 4 hours as needed   ?? ?? ?? ?? ?? ?? ?? ?? ??   Surgical Bra MISC  by Does not apply route S/p bilateral mastectomies  ?? ?? ?? ?? ?? ?? ?? ?? ??   tamoxifen (NOLVADEX) 20 MG tablet  Take 1 tablet by mouth daily  ?? ?? ?? ?? ?? ?? ?? ?? ??   ??  ??  DIET: regular  ??  ACTIVITY: resume regular activity  ??  COMPLEXITY OF FOLLOW UP:   [] ? Moderate Complexity: follow up within 7-14 calendar days [id number]   [x] ? Severe Complexity: follow up within 7 calendar days [id number])  ??  FOLLOW UP TESTING, PENDING RESULTS ORREFERRALS AT TRANSITIONAL CARE VISIT:    [] ?  Yes    [x] ?  No  ??  DISPOSITION:  Home with home care  ??  Follow up with America Brown, DO to be scheduled .  ??  Notification (telephone encounter) to PCP initiated:   [] ?  Yes    [] ?  No  ??  INSTRUCTIONS TO MA/SW: Please call patient on day after discharge (must document patient  contacted within 2 business days of discharge).  ??  FOLLOW  UP QUESTIONS FOR MA/SW:  1. Did you get medications filled and takingthem as instructed from discharge?  2. Are you following your discharge instructions from your hospital stay?  3. Please confirm patient is scheduled for a follow up appointment within the above time frame.  ??  DISCHARGE TIME: > 30 minutes  ??  Electronically signed by Smith Robert, MD on 02/17/19 at 4:11 PM EST  ??  ??  ??  ??  ??  ??  ??  ??  ??

## 2019-02-18 NOTE — Telephone Encounter (Signed)
Outside pcp. Faxed dc summary to patients pcp Dr. Cherlynn Polo. Staff to contact patient for a follow up.

## 2019-04-28 ENCOUNTER — Ambulatory Visit
Admit: 2019-04-28 | Discharge: 2019-04-28 | Payer: MEDICARE | Attending: Hematology & Oncology | Primary: Family Medicine

## 2019-04-28 DIAGNOSIS — C50112 Malignant neoplasm of central portion of left female breast: Secondary | ICD-10-CM

## 2019-04-28 DIAGNOSIS — Z170 Estrogen receptor positive status [ER+]: Secondary | ICD-10-CM

## 2019-04-28 NOTE — Progress Notes (Signed)
HPI  LETRICIA Padilla is a 69 y.o.  female who comes in for a follow-up.  Since her last visit, she has had an abnormal right mammogram which ultimately revealed DCIS on biopsy.  She underwent right mastectomy.  She continues on tamoxifen without any problems.  She is in good spirits.  She was admitted to [facility] in November with Covid.  Has recovered well.       she feels well.  Apettite  and energy levels are good.  No hot flashes.  Hair thinning has resolved.    she has the following oncology history  1.  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  2.  Currently on tamoxifen since 11/16/2013.  3.  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   HENT: Negative for mouth sores and trouble swallowing.    Eyes: Negative for photophobia and visual disturbance.   Respiratory: Negative for cough and shortness of breath.    Cardiovascular: Negative for chest pain.   Gastrointestinal: Negative for abdominal pain, diarrhea, nausea and vomiting.   Genitourinary: Negative for dysuria and hematuria.   Musculoskeletal: Negative for arthralgias and myalgias.   Skin: Negative for rash.   Neurological: Negative for seizures, light-headedness and headaches.   Hematological: Negative for adenopathy. Does not bruise/bleed easily.   Psychiatric/Behavioral: Negative for sleep disturbance.         Family History   Problem Relation Age of Onset   . Cancer Mother         lung   . Cancer Father         stomach   . Cancer Sister         breast   . Cancer Maternal Grandmother         colon     Social History     Tobacco Use   . Smoking status: Never Smoker   . Smokeless tobacco: Never Used   Substance Use Topics   . Alcohol use: No       Vitals:    04/28/19 1117   BP: 121/75   Pulse: 84   Temp: 98 F (36.7 C)   SpO2: 98%         Lab Results   Component Value Date    WBC  17.3 (H) 02/16/2019    HGB 12.3 02/16/2019    HCT 37.1 02/16/2019    MCV 98.6 (H) 02/16/2019    PLT 323 02/16/2019   Last mammogram July 2019 is negative.    Assessment/Plan:  [name] was seen today for follow-up.    Diagnoses and all orders for this visit:    Malignant neoplasm of central portion of left breast in female, estrogen receptor positive ([place])    1. she appears to be in complete remission. Will do only symptom guided imaging.    2. 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 daily.  Treatment intent is curative.  Timing is adjuvant.  Planned duration is 10 years.  Response Rate: NA since adjuvant  I have discussed some of the side efffects that include but not limited to arthralgias, myalgias, vaginal dryness, hair thinning, hot flashes, osteopenia.  Prognosis: Five-year disease-free survival 85%.  Absolute benefit of tamoxifen 4%.  NCCN guidelines were used.  She  consents to treatment. Shared decision making was performed.  Compliance assessed. Patient is taking medication at appropriate dose and schedule.    3. [name] on diet and exercise.  Patient verbalizes understanding and agrees with the plan.    4.  No need for mammogram since she has had bilateral mastectomy.    Breast Cancer Surveillance:  1. History and physical exam 1-4 times per year for 5 years, then annually  2. For women on tamoxifen: annual gynecologic assessment every 12 mo if uterus present  3. Encouraged physical activity, healthy diet, limiting alcohol intake, and achieving and maintaining a healthy body weight

## 2019-05-19 ENCOUNTER — Encounter: Attending: Women's Health | Primary: Family Medicine
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
## 2019-05-28 ENCOUNTER — Ambulatory Visit: Admit: 2019-05-28 | Discharge: 2019-05-28 | Payer: MEDICARE | Attending: Women's Health | Primary: Family Medicine

## 2019-05-28 DIAGNOSIS — Z9013 Acquired absence of bilateral breasts and nipples: Secondary | ICD-10-CM

## 2019-05-28 MED ORDER — MASTECTOMY BRA
0 refills | Status: DC
Start: 2019-05-28 — End: 2020-05-10

## 2019-05-28 MED ORDER — BREAST PROSTHESIS
1 refills | Status: DC
Start: 2019-05-28 — End: 2020-05-10
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
## 2019-07-28 MED ORDER — TAMOXIFEN CITRATE 20 MG PO TABS
20 MG | ORAL_TABLET | Freq: Every day | ORAL | 3 refills | Status: DC
Start: 2019-07-28 — End: 2020-06-20

## 2019-07-28 NOTE — Telephone Encounter (Signed)
Patient needs new Rx for her tamoxifen to [facility] [place] [address] [place]

## 2019-08-21 LAB — TSH: TSH: 2.021 u[IU]/mL (ref 0.465–4.680)

## 2019-08-21 LAB — CBC WITH AUTO DIFFERENTIAL
Absolute Baso #: 0 10*3/uL (ref 0.0–0.2)
Absolute Eos #: 0.4 10*3/uL (ref 0.0–0.5)
Absolute Lymph #: 2.7 10*3/uL (ref 1.0–4.3)
Absolute Mono #: 0.8 10*3/uL (ref 0.0–0.8)
Absolute Neut #: 4.2 10*3/uL (ref 1.8–7.0)
Basophils: 0.6 % (ref 0.0–2.0)
Eosinophils: 4.5 % (ref 1.0–6.0)
Granulocytes %: 51.9 % (ref 40.0–80.0)
Hematocrit: 35.7 % (ref 35.0–47.0)
Hemoglobin: 11.9 g/dL (ref 11.7–16.0)
Lymphocyte %: 33.3 % (ref 20.0–40.0)
MCH: 33.2 pg (ref 26.0–34.0)
MCHC: 33.3 % (ref 32.0–36.0)
MCV: 99.5 fL — ABNORMAL HIGH (ref 79.0–98.0)
MPV: 8.1 fL (ref 7.4–10.4)
Monocytes: 9.7 % (ref 2.0–10.0)
Platelets: 245 10*3/uL (ref 140–440)
RBC: 3.58 10*6/uL — ABNORMAL LOW (ref 3.80–5.20)
RDW: 13.7 % (ref 11.5–14.5)
WBC: 8 10*3/uL (ref 3.6–10.7)

## 2019-08-21 LAB — COMPREHENSIVE METABOLIC PANEL
ALT: 18 U/L (ref 0–34)
AST: 33 U/L (ref 15–46)
Albumin,Serum: 3.9 g/dL (ref 3.5–5.0)
Alkaline Phosphatase: 54 U/L (ref 38–126)
Anion Gap: 6 mmol/L (ref 3–13)
BUN: 16 mg/dL (ref 7–20)
CO2: 29 mmol/L (ref 22–30)
Calcium: 9.1 mg/dL (ref 8.4–10.4)
Chloride: 108 mmol/L — ABNORMAL HIGH (ref 98–107)
Creatinine: 1.02 mg/dL (ref 0.52–1.25)
EGFR IF NonAfrican American: 55.3 mL/min — AB (ref >60)
Glucose: 81 mg/dL (ref 70–100)
Potassium: 4.4 mmol/L (ref 3.5–5.1)
Sodium: 143 mmol/L (ref 135–145)
Total Bilirubin: 0.4 mg/dL (ref 0.2–1.3)
Total Protein: 7 g/dL (ref 6.3–8.2)
eGFR African American: 64.1 mL/min (ref >60)

## 2019-08-21 LAB — LIPID PANEL
Chol/HDL Ratio: 2 NA
Cholesterol: 146 mg/dL (ref <200)
HDL: 59 mg/dL (ref 40–60)
LDL Cholesterol: 71 mg/dL (ref <100)
Triglycerides: 79 mg/dL (ref <150)

## 2019-09-08 ENCOUNTER — Inpatient Hospital Stay: Admit: 2019-09-08 | Attending: Family Medicine | Primary: Family Medicine

## 2019-09-08 DIAGNOSIS — N1831 Chronic kidney disease, stage 3a: Secondary | ICD-10-CM

## 2019-11-18 ENCOUNTER — Encounter: Attending: Surgery | Primary: Family Medicine

## 2019-11-27 ENCOUNTER — Ambulatory Visit: Admit: 2019-11-27 | Discharge: 2019-11-27 | Payer: MEDICARE | Attending: Surgery | Primary: Family Medicine

## 2019-11-27 DIAGNOSIS — Z9013 Acquired absence of bilateral breasts and nipples: Secondary | ICD-10-CM
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
## 2020-04-26 ENCOUNTER — Encounter: Attending: Hematology & Oncology | Primary: Family Medicine

## 2020-05-05 ENCOUNTER — Inpatient Hospital Stay
Admit: 2020-05-05 | Discharge: 2020-05-11 | Disposition: A | Discharge status: Home Health | Payer: MEDICARE | Source: Home / Self Care | Admitting: Internal Medicine

## 2020-05-05 DIAGNOSIS — J189 Pneumonia, unspecified organism: Secondary | ICD-10-CM

## 2020-05-05 LAB — URINALYSIS
Bacteria, UA: NEGATIVE /HPF
Bilirubin Urine: NEGATIVE mg/dL
Glucose, Ur: NORMAL mg/dL (ref <70)
Hyaline Casts, UA: NEGATIVE /LPF
Ketones, Urine: 10 mg/dL — AB
LEUKOCYTES, UA: 25 Leu/uL — AB
Nitrite, Urine: NEGATIVE NA
Non-Squamous Epithelial: 4 /HPF — AB
Occult Blood,Urine: NEGATIVE mg/dL
Specific Gravity, Urine: 1.007 NA (ref 1.005–1.030)
Total Protein, Urine: NEGATIVE mg/dL
Urobilinogen, Urine: NORMAL mg/dL (ref 0–1)
pH, Urine: 7 NA (ref 5.0–8.0)

## 2020-05-05 LAB — LACTATE, SEPSIS
Lactic Acid: 1.5 mmol/L (ref 0.7–2.0)
Lactic Acid: 2.6 mmol/L (ref 0.7–2.0)

## 2020-05-05 LAB — COMPREHENSIVE METABOLIC PANEL W/ REFLEX TO MG FOR LOW K
ALT: 21 U/L (ref 0–34)
AST: 33 U/L (ref 15–46)
Albumin,Serum: 4 g/dL (ref 3.5–5.0)
Alkaline Phosphatase: 63 U/L (ref 38–126)
Anion Gap: 7 mmol/L (ref 3–13)
BUN: 8 mg/dL — ABNORMAL LOW (ref 9–20)
CO2: 22 mmol/L (ref 22–30)
Calcium: 9.3 mg/dL (ref 8.4–10.4)
Chloride: 109 mmol/L — ABNORMAL HIGH (ref 98–107)
Creatinine: 0.92 mg/dL (ref 0.52–1.25)
EGFR IF NonAfrican American: 62.4 mL/min (ref >60)
Glucose: 108 mg/dL — ABNORMAL HIGH (ref 70–100)
Potassium: 3.5 mmol/L (ref 3.5–5.1)
Sodium: 138 mmol/L (ref 135–145)
Total Bilirubin: 0.6 mg/dL (ref 0.2–1.3)
Total Protein: 6.9 g/dL (ref 6.3–8.2)
eGFR African American: 72.3 mL/min (ref >60)

## 2020-05-05 LAB — CBC WITH AUTO DIFFERENTIAL
Absolute Baso #: 0 10*3/uL (ref 0.0–0.2)
Absolute Eos #: 0 10*3/uL (ref 0.0–0.5)
Absolute Lymph #: 1.4 10*3/uL (ref 1.0–4.3)
Absolute Mono #: 0.3 10*3/uL (ref 0.0–0.8)
Absolute Neut #: 8.2 10*3/uL — ABNORMAL HIGH (ref 1.8–7.0)
Basophils: 0.4 % (ref 0.0–2.0)
Eosinophils: 0.4 % — ABNORMAL LOW (ref 1.0–6.0)
Granulocytes %: 82.5 % — ABNORMAL HIGH (ref 40.0–80.0)
Hematocrit: 40.5 % (ref 35.0–47.0)
Hemoglobin: 13.4 g/dL (ref 11.7–16.0)
Lymphocyte %: 14.1 % — ABNORMAL LOW (ref 20.0–40.0)
MCH: 32.4 pg (ref 26.0–34.0)
MCHC: 33.1 % (ref 32.0–36.0)
MCV: 98 fL (ref 79.0–98.0)
MPV: 7.7 fL (ref 7.4–10.4)
Monocytes: 2.6 % (ref 2.0–10.0)
Platelets: 246 10*3/uL (ref 140–440)
RBC: 4.14 10*6/uL (ref 3.80–5.20)
RDW: 14.3 % (ref 11.5–14.5)
WBC: 9.9 10*3/uL (ref 3.6–10.7)

## 2020-05-05 LAB — TROPONIN: Troponin I: <0.012 ng/mL (ref 0.000–0.034)

## 2020-05-05 LAB — BRAIN NATRIURETIC PEPTIDE: NT Pro-BNP: 65 pg/mL (ref 0–125)

## 2020-05-05 LAB — RESPIRATORY PANEL, MOLECULAR, WITH COVID-19: Respiratory Panel Molecular, with COVID: NEGATIVE

## 2020-05-05 LAB — LIPASE: Lipase: 45 U/L (ref 23–300)

## 2020-05-05 LAB — MAGNESIUM: Magnesium: 1.9 mg/dL (ref 1.6–2.3)

## 2020-05-05 MED ORDER — SODIUM CHLORIDE 0.9 % IV SOLN
0.9 | INTRAVENOUS | Status: DC | PRN
Start: 2020-05-05 — End: 2020-05-10

## 2020-05-05 MED ORDER — AZITHROMYCIN 500 MG IV SOLR
500 MG | Freq: Once | INTRAVENOUS | Status: AC
Start: 2020-05-05 — End: 2020-05-05
  Administered 2020-05-05: 19:00:00 via INTRAVENOUS

## 2020-05-05 MED ORDER — KETOROLAC TROMETHAMINE 30 MG/ML IJ SOLN
30 MG/ML | Freq: Once | INTRAMUSCULAR | Status: AC
Start: 2020-05-05 — End: 2020-05-05
  Administered 2020-05-05: 19:00:00 via INTRAVENOUS

## 2020-05-05 MED ORDER — SODIUM CHLORIDE 0.9 % IV BOLUS
0.9 % | Freq: Once | INTRAVENOUS | Status: AC
Start: 2020-05-05 — End: 2020-05-05
  Administered 2020-05-05: 19:00:00 via INTRAVENOUS

## 2020-05-05 MED ORDER — NORMAL SALINE FLUSH 0.9 % IV SOLN
0.9 % | INTRAVENOUS | Status: DC | PRN
Start: 2020-05-05 — End: 2020-05-10

## 2020-05-05 MED ORDER — ONDANSETRON HCL 4 MG/2ML IJ SOLN
4 MG/2ML | Freq: Once | INTRAMUSCULAR | Status: AC
Start: 2020-05-05 — End: 2020-05-05
  Administered 2020-05-05: 19:00:00 via INTRAVENOUS

## 2020-05-05 MED ORDER — DEXTROSE 5 % IV SOLN (ADD-VANTAGE)
5 | Freq: Once | INTRAVENOUS | Status: AC
Start: 2020-05-05 — End: 2020-05-05
  Administered 2020-05-05: 21:00:00 via INTRAVENOUS

## 2020-05-05 MED ORDER — ALBUTEROL SULFATE (2.5 MG/3ML) 0.083% IN NEBU
Freq: Once | RESPIRATORY_TRACT | Status: AC
Start: 2020-05-05 — End: 2020-05-05
  Administered 2020-05-05: 18:00:00 via RESPIRATORY_TRACT

## 2020-05-05 MED ORDER — MORPHINE SULFATE (PF) 4 MG/ML IJ SOLN
4 MG/ML | Freq: Once | INTRAMUSCULAR | Status: DC
Start: 2020-05-05 — End: 2020-05-05

## 2020-05-05 MED ORDER — NORMAL SALINE FLUSH 0.9 % IV SOLN
0.9 | Freq: Two times a day (BID) | INTRAVENOUS | Status: DC
Start: 2020-05-05 — End: 2020-05-10
  Administered 2020-05-06 – 2020-05-10 (×9): via INTRAVENOUS

## 2020-05-05 MED FILL — KETOROLAC TROMETHAMINE 30 MG/ML IJ SOLN: 30 mg/mL | INTRAMUSCULAR | Qty: 1

## 2020-05-05 MED FILL — ONDANSETRON HCL 4 MG/2ML IJ SOLN: 4 MG/2ML | INTRAMUSCULAR | Qty: 2

## 2020-05-05 NOTE — ED Notes (Signed)
Bed: 30  Expected date: 05/05/20  Expected time:   Means of arrival:   Comments:  EMS     Bartolo Darter, RN  05/05/20 1158

## 2020-05-05 NOTE — ED Provider Notes (Signed)
Emergency Department Encounter  [facility] MED SURG    Patient: Dawn Padilla  MRN: [id number]  DOB: Aug 07, 1950  Date of Evaluation: 05/05/2020  ED Supervising Physician: Jeryl Columbia, MD    I independently examined and evaluated Dawn Padilla.    In brief, Dawn Padilla is a 70 y.o. female that presents to the emergency department chest pain palpitations, chest pain that goes to the back, fever, dyspnea    Patient says that she started having midsternal chest pain this morning that radiates to her back.  She is tachycardic in the 120s.  She endorses shortness of breath today starting at 7 AM.  Patient denies any vomiting or diarrhea but she is nauseous.  She says she had a fever of 101 Fahrenheit this morning but took a Tylenol at 8 AM and currently her temperature is 97.6.  Her tachycardia persists at 124 bpm.  She is on 100 mg of metoprolol this morning, which she took.  She also takes 50 mg of metoprolol at night.  She endorses midsternal chest pain.  She is not allowed to take any aspirin because of a history of bleeding ulcers.  History of hypertension hyperlipidemia Parkinson's disease GERD, carotid endarterectomy, right lung surgery 1976, cholecystectomy, mastectomy.  She had a history of COVID-19 in November 2021 and never fully recovered from it.  She is always been minimally tired and minimally short of breath ever since but the fever tachycardia and chest pain are new this morning.  She was diaphoretic this morning but she is no longer diaphoretic now.    Focused exam:   General: Ill-appearing adult female lying semi-Fowlers in bed in mild respiratory distress    HENT: Head NCAT, EOMI with no erythema, swelling or D/C Oropharyngeal mucus membranes moist, pink, no exudate    Neck: Full ROM, supple, no rigidity    Cardio: Tachycardic in the 120s, nl s1 s2 no m/r/g, extremities warm, dry, well perfused, nonedematous    Lungs: Crackles in bilateral bases.  Scattered rhonchi in bilateral apices.  Good air  movement.  SaO2 98% on room air but she is breathing 24 times a minute.  No cough no cyanosis no retractions.  Speaks complete sentences.    Abdomen: Soft, NT, ND, non-rigid, BS x 4 normal    Skin: Warm, dry, pink, no rashes, bruising, or lacerations, no petechiae, no purpura    Neuro: Alert, oriented, mentating normally    EKG:   I read and interpreted this EKG.  My interpretation can be found in the [facility] EKG system.    Brief ED course/MDM:   70 year old female presents for chest pain that goes to her back, dyspnea, tachycardia, fever at home this morning afebrile here.  Concern for PE, pneumonia, residual dyspnea from COVID-19, ACS or combination of the above.  She needs a combination sepsis work-up and cardiology work-up and PE work-up at the same time.  We will give her IV fluids, Toradol for the body aches and she cannot have oral aspirin and does not want Tylenol and refuses narcotics.  We will start her on empirical antibiotics for her sepsis criteria, and she will require admission to the hospital no matter the results of the work-up.    Metabolic panel shows no metabolic acidosis, good kidney function, glucose within normal limits  Initial lactic acid 1.5.  However despite IV fluids and antibiotics, repeat lactic acid actually went up not down, now 2.6  Troponin negative  Aminotransferases alkaline phosphatase unremarkable  Lipase unremarkable  Normal white blood cell count  Not anemic  Urinalysis not consistent with UTI; nitrites negative, leukocytes 25 within normal limits for this assay, 6-10 white blood cells, no bacteriuria  Respiratory PCR panel negative  BNP negative    CTA chest shows no evidence of pulmonary embolism, focal irregular consolidation in the periphery of the right upper lobe most suggestive of focal pneumonia.  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     SEP-1 CORE MEASURE DATA      SIRS Criteria   Sepsis Criteria   Severe Sepsis Criteria    Septic Shock Criteria       Must meet 2:    [x] Temperature > 100.4 F (38 C)        or < 96.8 F (36 C)  [x] HR > 90  [] RR > 20  [] WBC > 12 or < 4 or 10% bands Must be confirmed or suspected to move forward with diagnosis of sepsis.    [] Infection Confirmed or        Suspected.                          [] No infection present. Patient does not meet criteria for Sepsis.    Must meet 1:    [x] Lactate > 2       or   [] Signs of Organ Dysfunction:    - SBP < 90 or MAP < 65  - Altered mental status  - Creatinine > 2 or increased from      baseline  - Urine Output < 0.5 ml/kg/hr  - Bilirubin > 2  - INR > 1.5  - Platelets < 100,000  - Acute Respiratory Failure as     evidenced by new need for NIPPV     or mechanical ventilation    [] No criteria met for Severe Sepsis.   Must meet 1:    [] Lactate > 4        or   [] SBP < 90 or MAP < 65 for at        least two readings in the first        hour after fluid bolus        administration                      [] No criteria met for Septic Shock.   Patient Vitals for the past 6 hrs:   BP Pulse Resp SpO2   05/05/20 1410 (!) 148/74 117 22 100 %   05/05/20 1522 (!) 145/67 132 24 99 %   05/05/20 1644 (!) 157/77 122 ??? ???   05/05/20 1800 (!) 143/78 118 20 97 %   05/05/20 1854 (!) 155/75 122 18 98 %      Recent Labs     05/05/20  1322 05/05/20  1552   WBC 9.9  --    LACTA 1.5 2.6*   CREATININE 0.92  --    BILITOT 0.6  --    PLT 246  --         Sepsis Identified at [id number]    Fluid Resuscitation Rational: Does not meet criteria for septic shock and was not hypotensive.    Infection Source: Pulmonary - Community Acquired    On reassessment, tachycardia is persisting in the 110s to 120s, however patient's tachypnea is improving and  her oxygen saturation is maintained in the upper 90s on 2 L/min nasal cannula oxygen.  No indication for ICU admission at this time.    Electronically signed by Jeryl Columbia, MD on 05/05/2020 at 7:20 PM    All diagnostic, treatment, and disposition decisions  were made by myself in conjunction with the APP/Resident. For all further details of the patient's emergency department visit, please see their documentation.  This note will serve as my supervisory note and attestation.    Disposition: Admitted    Clinical impression: Right upper lobe pneumonia, severe sepsis, dyspnea, lactic acidosis    Critical Care Attestation:  The patient required critical care because the patient suffered severe sepsis. Critical care provided to the patient included: Lab review, multiple reassessments of patient, working with consultants, treating dyspnea and severe sepsis, documentation time. Critical care time does not include any procedures performed. Total non-concurrent critical care time: 45 minutes.    (Please note that portions of this note may have been completed with a voice recognition program. Efforts were made to edit the dictations but occasionally words are mis-transcribed.)    Jeryl Columbia, MD  [employer]       Holston Oyama R Benjerman Molinelli, MD  05/05/20 [phone]

## 2020-05-05 NOTE — ED Triage Notes (Signed)
[facility] EMERGENCY DEPT  Quick Cognitive Screen Performed     [x] Answered by Patient  [] Input provided by Family/Visitor   [] Unable to be performed due to Patient Medical Status/not at risk for elopement       (I.e. intubated/critical care patient)    1. Patient Identifies Current Year?    [x] Pt able to identify current year   [] Pt unable to identify current year     2.   Patient Identifies Current Month?    [x] Pt able to identify current month   [] Pt unable to identify current month    3.   Recently Confused?    [x] Negative history of confusion   [] Positive history of confusion    Quick Cognitive Screen Result:    [x] Negative (no further action required)  [] Positive (document interventions using .qcsintervention) once patient in room

## 2020-05-05 NOTE — ED Notes (Signed)
Attempted iv twice unsuccessful - asked trauma float to place ultrasound line     Leta Baptist, RN  05/05/20 1334

## 2020-05-05 NOTE — Progress Notes (Signed)
Sobia Karger Chrismer was ordered  ALENDRONATE 70 MG WEEKLY. Oral bisphosphonates have been shown to increase the risk of serious GI events if the strict administration instructions are not followed. Because it is often difficult to follow these strict measures in hospitalized patients, the [place] and [employer] Committee has determined the risk of administration to hospitalized patients outweighs the potential benefit.  Therefore, all orders for oral bisphosphonate are automatically discontinued per Nelson #[id number].     The medication remains on the Home Medication List for resumption at discharge unless specifically discontinued by the prescriber.      Edrick Oh, RPh
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
## 2020-05-05 NOTE — ED Notes (Signed)
Pt eating her meal     Leta Baptist, RN  05/05/20 [phone]

## 2020-05-06 LAB — COMPREHENSIVE METABOLIC PANEL
ALT: 19 U/L (ref 0–34)
AST: 32 U/L (ref 15–46)
Albumin,Serum: 3 g/dL — ABNORMAL LOW (ref 3.5–5.0)
Alkaline Phosphatase: 45 U/L (ref 38–126)
Anion Gap: 5 mmol/L (ref 3–13)
BUN: 10 mg/dL (ref 9–20)
CO2: 23 mmol/L (ref 22–30)
Calcium: 8.5 mg/dL (ref 8.4–10.4)
Chloride: 112 mmol/L — ABNORMAL HIGH (ref 98–107)
Creatinine: 0.86 mg/dL (ref 0.52–1.25)
EGFR IF NonAfrican American: 67.7 mL/min (ref >60)
Glucose: 89 mg/dL (ref 70–100)
Potassium: 3.8 mmol/L (ref 3.5–5.1)
Sodium: 141 mmol/L (ref 135–145)
Total Bilirubin: 0.4 mg/dL (ref 0.2–1.3)
Total Protein: 5.6 g/dL — ABNORMAL LOW (ref 6.3–8.2)
eGFR African American: 78.4 mL/min (ref >60)

## 2020-05-06 LAB — STREP PNEUMONIAE ANTIGEN: STREP PNEUMONIAE ANTIGEN, URINE: NOT DETECTED

## 2020-05-06 LAB — PROCALCITONIN: Procalcitonin: 0.05 ng/mL (ref 0.00–0.09)

## 2020-05-06 LAB — LEGIONELLA ANTIGEN, URINE: LEGIONELLA ANTIGEN: NOT DETECTED

## 2020-05-06 MED ORDER — ATORVASTATIN CALCIUM 20 MG PO TABS
20 MG | Freq: Every day | ORAL | Status: DC
Start: 2020-05-06 — End: 2020-05-10
  Administered 2020-05-06 – 2020-05-10 (×4): via ORAL

## 2020-05-06 MED ORDER — GUAIFENESIN ER 600 MG PO TB12
600 MG | Freq: Two times a day (BID) | ORAL | Status: DC
Start: 2020-05-06 — End: 2020-05-10
  Administered 2020-05-06 – 2020-05-10 (×10): via ORAL

## 2020-05-06 MED ORDER — DICYCLOMINE HCL 20 MG PO TABS
20 MG | Freq: Four times a day (QID) | ORAL | Status: DC | PRN
Start: 2020-05-06 — End: 2020-05-10
  Administered 2020-05-06 – 2020-05-10 (×3): via ORAL

## 2020-05-06 MED ORDER — CEFTRIAXONE SODIUM 1 G IV SOLR
1 g | INTRAVENOUS | Status: DC
Start: 2020-05-06 — End: 2020-05-06

## 2020-05-06 MED ORDER — METOPROLOL TARTRATE 50 MG PO TABS
50 MG | Freq: Every day | ORAL | Status: DC
Start: 2020-05-06 — End: 2020-05-10
  Administered 2020-05-06 – 2020-05-10 (×5): via ORAL

## 2020-05-06 MED ORDER — BENZTROPINE MESYLATE 0.5 MG PO TABS
0.5 MG | Freq: Every evening | ORAL | Status: DC
Start: 2020-05-06 — End: 2020-05-10
  Administered 2020-05-06 – 2020-05-10 (×5): via ORAL

## 2020-05-06 MED ORDER — AMLODIPINE BESYLATE 5 MG PO TABS
5 MG | Freq: Every day | ORAL | Status: DC
Start: 2020-05-06 — End: 2020-05-10
  Administered 2020-05-06 – 2020-05-10 (×5): via ORAL

## 2020-05-06 MED ORDER — CALCIUM + D3 600-200 MG-UNIT PO TABS
600-200 MG-UNIT | Freq: Every day | ORAL | Status: DC
Start: 2020-05-06 — End: 2020-05-05

## 2020-05-06 MED ORDER — ACETAMINOPHEN 500 MG PO TABS
500 MG | Freq: Three times a day (TID) | ORAL | Status: DC | PRN
Start: 2020-05-06 — End: 2020-05-10
  Administered 2020-05-06 – 2020-05-08 (×6): 1000 mg via ORAL

## 2020-05-06 MED ORDER — ALENDRONATE SODIUM 70 MG PO TABS
70 MG | ORAL | Status: DC
Start: 2020-05-06 — End: 2020-05-05

## 2020-05-06 MED ORDER — SODIUM CHLORIDE 0.9 % IV SOLN (MINI-BAG)
0.9 % | Freq: Once | INTRAVENOUS | Status: DC
Start: 2020-05-06 — End: 2020-05-05

## 2020-05-06 MED ORDER — CEFTRIAXONE SODIUM 1 G IV SOLR
1 g | INTRAVENOUS | Status: DC
Start: 2020-05-06 — End: 2020-05-05

## 2020-05-06 MED ORDER — LISINOPRIL 20 MG PO TABS
20 MG | Freq: Every day | ORAL | Status: DC
Start: 2020-05-06 — End: 2020-05-10
  Administered 2020-05-06 – 2020-05-09 (×4): via ORAL

## 2020-05-06 MED ORDER — CYCLOBENZAPRINE HCL 10 MG PO TABS
10 MG | Freq: Three times a day (TID) | ORAL | Status: DC | PRN
Start: 2020-05-06 — End: 2020-05-10
  Administered 2020-05-06 – 2020-05-10 (×3): 10 mg via ORAL

## 2020-05-06 MED ORDER — DEXTROSE 5 % IV SOLN (ADD-VANTAGE)
5 % | INTRAVENOUS | Status: DC
Start: 2020-05-06 — End: 2020-05-09
  Administered 2020-05-06 – 2020-05-08 (×3): via INTRAVENOUS

## 2020-05-06 MED ORDER — TAMOXIFEN CITRATE 10 MG PO TABS
10 MG | Freq: Every day | ORAL | Status: DC
Start: 2020-05-06 — End: 2020-05-10
  Administered 2020-05-06 – 2020-05-09 (×4): via ORAL

## 2020-05-06 MED ORDER — DOCUSATE SODIUM 100 MG PO CAPS
100 MG | Freq: Two times a day (BID) | ORAL | Status: DC | PRN
Start: 2020-05-06 — End: 2020-05-10
  Administered 2020-05-06: 15:00:00 via ORAL

## 2020-05-06 MED ORDER — AZITHROMYCIN 250 MG PO TABS
250 MG | Freq: Every day | ORAL | Status: DC
Start: 2020-05-06 — End: 2020-05-06

## 2020-05-06 MED ORDER — AZITHROMYCIN 500 MG IV SOLR
500 MG | INTRAVENOUS | Status: DC
Start: 2020-05-06 — End: 2020-05-06

## 2020-05-06 MED ORDER — ALBUTEROL SULFATE (2.5 MG/3ML) 0.083% IN NEBU
Freq: Four times a day (QID) | RESPIRATORY_TRACT | Status: DC
Start: 2020-05-06 — End: 2020-05-07
  Administered 2020-05-06 – 2020-05-07 (×5): via RESPIRATORY_TRACT

## 2020-05-06 MED ORDER — METOPROLOL TARTRATE 100 MG PO TABS
100 MG | Freq: Every day | ORAL | Status: DC
Start: 2020-05-06 — End: 2020-05-10
  Administered 2020-05-06 – 2020-05-10 (×5): via ORAL

## 2020-05-06 MED ORDER — CALCIUM CARB-CHOLECALCIFEROL 500-200 MG-UNIT PO TABS
500-200 MG-UNIT | Freq: Two times a day (BID) | ORAL | Status: DC
Start: 2020-05-06 — End: 2020-05-10
  Administered 2020-05-06 – 2020-05-10 (×10): via ORAL

## 2020-05-06 MED FILL — AMLODIPINE BESYLATE 5 MG PO TABS: 5 mg | ORAL | Qty: 1

## 2020-05-06 MED FILL — CEFTRIAXONE SODIUM 1 G IV SOLR: 1 g | INTRAVENOUS | Qty: 1

## 2020-05-06 MED FILL — TAMOXIFEN CITRATE 10 MG PO TABS: 10 mg | ORAL | Qty: 2

## 2020-05-06 MED FILL — MUCUS RELIEF ER 600 MG PO TB12: 600 mg | ORAL | Qty: 1

## 2020-05-06 MED FILL — AZITHROMYCIN 500 MG IV SOLR: 500 mg | INTRAVENOUS | Qty: 500

## 2020-05-06 MED FILL — CYCLOBENZAPRINE HCL 10 MG PO TABS: 10 mg | ORAL | Qty: 1

## 2020-05-06 MED FILL — METOPROLOL TARTRATE 100 MG PO TABS: 100 mg | ORAL | Qty: 1

## 2020-05-06 MED FILL — ALBUTEROL SULFATE (2.5 MG/3ML) 0.083% IN NEBU: RESPIRATORY_TRACT | Qty: 3

## 2020-05-06 MED FILL — DICYCLOMINE HCL 20 MG PO TABS: 20 mg | ORAL | Qty: 1

## 2020-05-06 MED FILL — OYSTER SHELL CALCIUM/D 500-5 MG-MCG PO TABS: 500-5 | ORAL | Qty: 1

## 2020-05-06 MED FILL — BENZTROPINE MESYLATE 0.5 MG PO TABS: 0.5 mg | ORAL | Qty: 1

## 2020-05-06 MED FILL — MAPAP 500 MG PO TABS: 500 mg | ORAL | Qty: 2

## 2020-05-06 MED FILL — LISINOPRIL 20 MG PO TABS: 20 mg | ORAL | Qty: 2

## 2020-05-06 MED FILL — AZITHROMYCIN 250 MG PO TABS: 250 mg | ORAL | Qty: 2

## 2020-05-06 MED FILL — DOK 100 MG PO CAPS: 100 mg | ORAL | Qty: 1

## 2020-05-06 MED FILL — ATORVASTATIN CALCIUM 20 MG PO TABS: 20 mg | ORAL | Qty: 1

## 2020-05-06 MED FILL — METOPROLOL TARTRATE 50 MG PO TABS: 50 mg | ORAL | Qty: 1
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
## 2020-05-06 NOTE — Plan of Care (Signed)
Problem: Skin Integrity:  Goal: Will show no infection signs and symptoms  Description: Will show no infection signs and symptoms  Outcome: Ongoing  Goal: Absence of new skin breakdown  Description: Absence of new skin breakdown  Outcome: Ongoing     Problem: Falls - Risk of:  Goal: Will remain free from falls  Description: Will remain free from falls  Outcome: Ongoing  Goal: Absence of physical injury  Description: Absence of physical injury  Outcome: Ongoing

## 2020-05-06 NOTE — Care Coordination-Inpatient (Signed)
[place] ([place] Medicaid patients) goes back to going thru [facility].     When ordering transport for a patient, be certain to state it is for a hospital discharge, whether patient is going to a facility or home.     The phone number at [facility] that we are to call is [phone]    Members ID #: [id number]    Electronically signed by Eudelia Bunch, LSW on 05/06/2020 at 10:38 AM

## 2020-05-06 NOTE — Care Coordination-Inpatient (Signed)
Pt active with Passport services.   CM Radene Ou [phone].   I called and spoke with coverage line and learned, pt receives 5 meals a week, 6 hours a week of personal care, ERS button.   Electronically signed by Eudelia Bunch, LSW on 05/06/2020 at 10:58 AM

## 2020-05-06 NOTE — Care Coordination-Inpatient (Signed)
Discharge Planning  Current Residence: Private Residence  Living Arrangements: Family Members  Support Systems: Family Members  Current Services Prior To Admission: ESP/Passport,Emergency Call  System  Potential Assistance Needed: N/A  Potential Assistance Purchasing Medications: No  Meds-to-Beds: Does the patient want to have any new prescriptions delivered to bedside prior to discharge?: Yes  Type of Home Care Services: [facility]  Patient expects to be discharged to:: House  Expected Discharge Date: 05/09/20  Follow Up Appointment: Best Day/Time :  (any)      Emergency Contacts  Contact 1: Name: Sheliah Hatch  Contact 1: Number: [phone]  Contact 1: Relationship: dtr    Social/Functional History  Lives With: Family (granddaughter)  Type of Home: House  Home Layout: Two level,Performs ADL's on one level,Able to Live on Main level with bedroom/bathroom  Home Access: Stairs to enter with rails  Entrance Stairs - Number of Steps: 2  Entrance Stairs - Rails: Right  Bathroom Shower/Tub: Administrator, Civil Service: Standard  Bathroom Equipment: Grab bars in Estate agent Accessibility: Accessible  Home Equipment: Quad cane,Wheelchair-manual ([place])  Receives Help From: [employer]  ADL Assistance:  (min)  [place]:  (mod)  Homemaking Responsibilities: No  Ambulation Assistance: Independent (uses quad cane)  Transfer Assistance: Independent  Active Driver: Yes  Mode of Transportation: Immunologist: unknown  Occupation: Retired  Type of occupation: n/a  Leisure & Hobbies: n/a  IADL Comments: n/a  Additional Comments: pt is active with passport    Pt adm for tx/evaluation of pneumonia. Receiving IV ceftriaxone. Consult to pulm. Pt on supplemental oxygen- does NOT wear at baseline. PT/OT to eval. Met with pt- introduced self and role. Pt from home with 43yo granddaughter. Pt is active with passport. SW notified CM of admission and verified services. Pt drives, has insurance  and able to obtain meds. Anticipate return home. Electronically signed by Montez Hageman, RN on 05/06/20 at 12:20 PM EST
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
## 2020-05-07 LAB — CBC WITH AUTO DIFFERENTIAL
Absolute Baso #: 0.1 10*3/uL (ref 0.0–0.2)
Absolute Eos #: 0.3 10*3/uL (ref 0.0–0.5)
Absolute Lymph #: 1.9 10*3/uL (ref 1.0–4.3)
Absolute Mono #: 0.6 10*3/uL (ref 0.0–0.8)
Absolute Neut #: 3.4 10*3/uL (ref 1.8–7.0)
Basophils: 0.9 % (ref 0.0–2.0)
Eosinophils: 4.6 % (ref 1.0–6.0)
Granulocytes %: 53.6 % (ref 40.0–80.0)
Hematocrit: 34.1 % — ABNORMAL LOW (ref 35.0–47.0)
Hemoglobin: 11.3 g/dL — ABNORMAL LOW (ref 11.7–16.0)
Lymphocyte %: 30.9 % (ref 20.0–40.0)
MCH: 32.7 pg (ref 26.0–34.0)
MCHC: 33.1 % (ref 32.0–36.0)
MCV: 98.7 fL — ABNORMAL HIGH (ref 79.0–98.0)
MPV: 7.5 fL (ref 7.4–10.4)
Monocytes: 10 % (ref 2.0–10.0)
Platelets: 216 10*3/uL (ref 140–440)
RBC: 3.46 10*6/uL — ABNORMAL LOW (ref 3.80–5.20)
RDW: 14.4 % (ref 11.5–14.5)
WBC: 6.3 10*3/uL (ref 3.6–10.7)

## 2020-05-07 LAB — CULTURE, URINE

## 2020-05-07 LAB — LACTIC ACID: Lactic Acid: 1.2 mmol/L (ref 0.7–2.0)

## 2020-05-07 MED ORDER — ALBUTEROL SULFATE (2.5 MG/3ML) 0.083% IN NEBU
Freq: Two times a day (BID) | RESPIRATORY_TRACT | Status: DC
Start: 2020-05-07 — End: 2020-05-09
  Administered 2020-05-07 – 2020-05-08 (×3): via RESPIRATORY_TRACT

## 2020-05-07 MED ORDER — ALUM & MAG HYDROXIDE-SIMETH 200-200-20 MG/5ML PO SUSP
200-200-20 MG/5ML | Freq: Four times a day (QID) | ORAL | Status: DC | PRN
Start: 2020-05-07 — End: 2020-05-10
  Administered 2020-05-07: 21:00:00 via ORAL

## 2020-05-07 MED FILL — MUCUS RELIEF ER 600 MG PO TB12: 600 mg | ORAL | Qty: 1

## 2020-05-07 MED FILL — OYSTER SHELL CALCIUM/D 500-5 MG-MCG PO TABS: 500-5 | ORAL | Qty: 1

## 2020-05-07 MED FILL — TAMOXIFEN CITRATE 10 MG PO TABS: 10 mg | ORAL | Qty: 2

## 2020-05-07 MED FILL — MAG-AL PLUS 200-200-20 MG/5ML PO LIQD: 200-200-20 MG/5ML | ORAL | Qty: 30

## 2020-05-07 MED FILL — AMLODIPINE BESYLATE 5 MG PO TABS: 5 mg | ORAL | Qty: 1

## 2020-05-07 MED FILL — MAPAP 500 MG PO TABS: 500 mg | ORAL | Qty: 2

## 2020-05-07 MED FILL — METOPROLOL TARTRATE 100 MG PO TABS: 100 mg | ORAL | Qty: 1

## 2020-05-07 MED FILL — CEFTRIAXONE SODIUM 1 G IV SOLR: 1 g | INTRAVENOUS | Qty: 1

## 2020-05-07 MED FILL — BENZTROPINE MESYLATE 0.5 MG PO TABS: 0.5 mg | ORAL | Qty: 1

## 2020-05-07 MED FILL — METOPROLOL TARTRATE 50 MG PO TABS: 50 mg | ORAL | Qty: 1

## 2020-05-07 MED FILL — ATORVASTATIN CALCIUM 20 MG PO TABS: 20 mg | ORAL | Qty: 1

## 2020-05-07 MED FILL — LISINOPRIL 20 MG PO TABS: 20 mg | ORAL | Qty: 2

## 2020-05-07 NOTE — Progress Notes (Signed)
Nutrition rescreen completed. Chart reviewed. Patient to be monitored and followed by the diet technician.  Electronically signed by Barry Brunner, DTR on 05/07/2020 at 7:34 AM

## 2020-05-07 NOTE — Other (Signed)
Dr. Sedalia Muta notified that patient is c/o of mid sternal burning that does not radiate or feel like CP. She states "I was eating meatloaf and this always happens with the spices". Requesting maalox, order received. Vitals WNL.

## 2020-05-07 NOTE — Progress Notes (Signed)
Patient Evaluation Form    The patient is currently receiving Proventil 4x Daily    Points 0 1 2 3 4  Points Totals    Pulmonary Status  (-/+) History   Smoking history < 20 pack years Smoking history > 20 pack years Pulmonary Disorder (acute or chronic) Severe or Chronic with Exacerbation 3   Surgical Status No Surgery  Trach  PEG General Surgery Lower Abdominal Thoracic or Upper Abdominal Thoracic with Pulmonary Disorder 0   Chest X-ray Clear  None Ordered Chronic Changes  CXR results Pending Infiltrates, atelectasis, pleural effusion, or edema Infiltrates in more than one lobe Infiltrate + Atelectasis, &/or pleural effusion 0   Respiratory Pattern Regular,  RR = 12-20 Increased,  RR = 21-25 DOE, irregular, or RR = 26-30 Decreased FEV1 or RR = 31-35 Severe SOB, used of of accessory muscles, or RR = > 35 0   Mental Status Alert, oriented, cooperative Confused, but follows commands Lethargic or un-able to follow commands Obtunded Comatose 0   Breath Sounds Clear to auscultation Decreased unilaterally or in bases only Decreased bilaterally Crackles or intermittent wheezes Wheezes  2   Cough Strong, spontaneous, & nonproductive Strong, spontaneous, & productive Weak, nonproductive Weak, productive or with wheezes No spontaneous cough or may require suctioning  0   Level of Activity Ambulatory Ambulatory with Assist Non-ambulatroy Paraplegic Quadriplegic 1   Triage 1  > 20 pts Triage 2  16-20 pts Triage 3  11- 15 pts Triage 4  6 - 10 pts Triage 5  0 - 5 pts TOTAL POINTS = 6          Triage Score = 4    Changing Therapy to Proventil BID    Electronically signed by Darcella Cheshire Fine, RCP on 05/07/2020 at 5:01 AM

## 2020-05-07 NOTE — Progress Notes (Signed)
Subjective:     Dawn Padilla is a 70 y.o. female The patient is a 70 y.o. 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 Follow-up to clearing is   suggested.   3. Hyperinflation with emphysematous disease and bilateral upper and   lower lobe and right middle lobe bronchiectasis.     COVID=negative.  LA elvated at 2.6  Given fluids and started antibiotics,  pulm consutled.  PT-->to snf but has aide and granddaughter and does not want SNF.  procl .05  Repeat LA 1.2 after fluids,  urien antigens negative so per antibiotic stewardship=zitromax discontinued.   -  On nebulizers, ceftrioxone,mucinex, pulmonary conulst P    SUBJECTIVE:  Feeling better, walked to bathroom unassited,  Still not able to cough up phlegm but does feel is loosing,  HR better,    Review of Systems 10 systems reviewed,positives include above          Objective:   BP 139/69    Pulse 109    Temp 97.3 ??F (36.3 ??C) (Temporal)    Resp 15    Ht 5\' 4"  (1.626 m)    Wt 130 lb (59 kg)    SpO2 99%    BMI 22.31 kg/m??   Physical Exam  Thin 69yo BF, on NC, no effort, A & O x3  Lungs clear  Heart reg, rate now ok  abd soft  No tpp  Ext no edema  pscyh wnl  No focal motor defectis, Did not amulate patient.  ??    Lab Results   Component Value Date    WBC 6.3 05/07/2020    HGB 11.3 (L) 05/07/2020    HCT 34.1 (L) 05/07/2020    MCV 98.7 (H) 05/07/2020    PLT 216 05/07/2020    LYMPHOPCT 30.9 05/07/2020    GRANULOCYTES 53.6 05/07/2020    RBC 3.46 (L) 05/07/2020    MCH 32.7 05/07/2020    MCHC 33.1 05/07/2020    RDW 14.4 05/07/2020       Lab Results   Component Value Date    NA 141 05/06/2020    K 3.8 05/06/2020    CL 112 (H) 05/06/2020    CO2 23 05/06/2020    BUN 10 05/06/2020    CREATININE 0.86 05/06/2020    GLUCOSE 89 05/06/2020    CALCIUM 8.5 05/06/2020    PROT 5.6 (L) 05/06/2020    LABALBU  3.0 (L) 05/06/2020    BILITOT 0.4 05/06/2020    ALKPHOS 45 05/06/2020    AST 32 05/06/2020    ALT 19 05/06/2020       Current Medications:      Orders Placed This Encounter   Medications   ??? DISCONTD: morphine sulfate (PF) injection 4 mg   ??? ondansetron (ZOFRAN) injection 4 mg   ??? 0.9 % sodium chloride bolus   ??? sodium chloride flush 0.9 % injection 5-40 mL   ??? sodium chloride flush 0.9 % injection 5-40 mL   ??? 0.9 % sodium chloride infusion   ??? AND Linked Order Group    ??? cefTRIAXone sodium 1,000 mg in dextrose 5 % 50 mL IVPB (add-vantage)      Order Specific Question:   Antimicrobial Indications      Answer:   Pneumonia (CAP)    ???  azithromycin (ZITHROMAX) 500 mg in NS addavial      Order Specific Question:   Antimicrobial Indications      Answer:   Pneumonia (CAP)   ??? ketorolac (TORADOL) injection 15 mg   ??? albuterol (PROVENTIL) nebulizer solution 2.5 mg     Order Specific Question:   Initiate RT Bronchodilator Protocol     Answer:   Yes   ??? acetaminophen (TYLENOL) tablet 1,000 mg   ??? DISCONTD: alendronate (FOSAMAX) tablet 70 mg   ??? amLODIPine (NORVASC) tablet 5 mg   ??? atorvastatin (LIPITOR) tablet 20 mg   ??? benztropine (COGENTIN) tablet 0.5 mg   ??? DISCONTD: Calcium + D3 600-200 MG-UNIT tablet 1 tablet   ??? dicyclomine (BENTYL) tablet 20 mg   ??? docusate sodium (COLACE) capsule 100 mg   ??? lisinopril (PRINIVIL;ZESTRIL) tablet 40 mg   ??? cyclobenzaprine (FLEXERIL) tablet 10 mg   ??? metoprolol (LOPRESSOR) tablet 100 mg   ??? metoprolol tartrate (LOPRESSOR) tablet 50 mg   ??? tamoxifen (NOLVADEX) tablet 20 mg   ??? guaiFENesin (MUCINEX) extended release tablet 600 mg   ??? DISCONTD: albuterol (PROVENTIL) nebulizer solution 2.5 mg     Order Specific Question:   Initiate RT Bronchodilator Protocol     Answer:   Yes   ??? DISCONTD: cefTRIAXone sodium 1,000 mg in dextrose 5 % 50 mL IVPB (add-vantage)     Order Specific Question:   Antimicrobial Indications     Answer:   Pneumonia (CAP)   ??? DISCONTD: azithromycin (ZITHROMAX) 500 mg in NS  addavial     Order Specific Question:   Antimicrobial Indications     Answer:   Pneumonia (CAP)   ??? calcium-cholecalciferol 500-200 MG-UNIT per tablet 1 tablet   ??? DISCONTD: cefTRIAXone sodium 1,000 mg in dextrose 5 % 50 mL IVPB (add-vantage)     Order Specific Question:   Antimicrobial Indications     Answer:   Pneumonia (CAP)   ??? DISCONTD: azithromycin (ZITHROMAX) 500 mg in NS addavial     Order Specific Question:   Antimicrobial Indications     Answer:   Pneumonia (CAP)   ??? cefTRIAXone sodium 1,000 mg in dextrose 5 % 50 mL IVPB (add-vantage)     Order Specific Question:   Antimicrobial Indications     Answer:   Pneumonia (CAP)   ??? DISCONTD: azithromycin (ZITHROMAX) tablet 500 mg     Order Specific Question:   Antimicrobial Indications     Answer:   Pneumonia (CAP)   ??? albuterol (PROVENTIL) nebulizer solution 2.5 mg     Order Specific Question:   Initiate RT Bronchodilator Protocol     Answer:   Yes       Assessment/Plan       Penumona-await pulm input, cont curretn tx  Underweight-add supplements  Weakness-seen by PT, does not want SNF, will cont to discuss  HTN=norvasd, metoprolol  HLD=hme med  Lactic acidosis=resolved  Hx of breat cancer  Tachycardia-improoved with flids  Hx fof COVID 02/2019      -

## 2020-05-08 MED ORDER — PREDNISONE 10 MG PO TABS
10 MG | Freq: Every day | ORAL | Status: DC
Start: 2020-05-08 — End: 2020-05-10
  Administered 2020-05-09 – 2020-05-10 (×2): via ORAL

## 2020-05-08 MED ORDER — GABAPENTIN 100 MG PO CAPS
100 MG | Freq: Every evening | ORAL | Status: DC
Start: 2020-05-08 — End: 2020-05-10
  Administered 2020-05-08 – 2020-05-10 (×3): via ORAL

## 2020-05-08 MED FILL — BENZTROPINE MESYLATE 0.5 MG PO TABS: 0.5 mg | ORAL | Qty: 1

## 2020-05-08 MED FILL — TAMOXIFEN CITRATE 10 MG PO TABS: 10 mg | ORAL | Qty: 2

## 2020-05-08 MED FILL — MAPAP 500 MG PO TABS: 500 mg | ORAL | Qty: 2

## 2020-05-08 MED FILL — LISINOPRIL 20 MG PO TABS: 20 mg | ORAL | Qty: 2

## 2020-05-08 MED FILL — METOPROLOL TARTRATE 50 MG PO TABS: 50 mg | ORAL | Qty: 1

## 2020-05-08 MED FILL — MUCUS RELIEF ER 600 MG PO TB12: 600 mg | ORAL | Qty: 1

## 2020-05-08 MED FILL — METOPROLOL TARTRATE 100 MG PO TABS: 100 mg | ORAL | Qty: 1

## 2020-05-08 MED FILL — ATORVASTATIN CALCIUM 20 MG PO TABS: 20 mg | ORAL | Qty: 1

## 2020-05-08 MED FILL — CEFTRIAXONE SODIUM 1 G IV SOLR: 1 g | INTRAVENOUS | Qty: 1

## 2020-05-08 MED FILL — OYSTER SHELL CALCIUM/D 500-5 MG-MCG PO TABS: 500-5 | ORAL | Qty: 1

## 2020-05-08 MED FILL — GABAPENTIN 100 MG PO CAPS: 100 mg | ORAL | Qty: 1

## 2020-05-08 MED FILL — PREDNISONE 10 MG PO TABS: 10 mg | ORAL | Qty: 1

## 2020-05-08 MED FILL — AMLODIPINE BESYLATE 5 MG PO TABS: 5 mg | ORAL | Qty: 1
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
## 2020-05-08 NOTE — Progress Notes (Signed)
Subjective:     Dawn Padilla is a 70 y.o. female The patient is a 70 y.o. 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 Follow-up to clearing is   suggested.   3. Hyperinflation with emphysematous disease and bilateral upper and   lower lobe and right middle lobe bronchiectasis.     COVID=negative.  LA elvated at 2.6  Given fluids and started antibiotics,  pulm consutled.  PT-->to snf but has aide and granddaughter and does not want SNF.  procl .05  Repeat LA 1.2 after fluids,  urien antigens negative so per antibiotic stewardship=zitromax discontinued.   -  On nebulizers, ceftrioxone,mucinex, pulmonary conulst P    SUBJECTIVE:  Feeling better, walked to bathroom unassited,breather better, less cough,   nonew issues, waiting on input from pulmonary,    Review of Systems 10 systems reviewed,positives include above          Objective:   BP 129/70    Pulse 98    Temp 96.9 ??F (36.1 ??C) (Temporal)    Resp 16    Ht 5\' 4"  (1.626 m)    Wt 130 lb (59 kg)    SpO2 95%    BMI 22.31 kg/m??   Physical Exam  Thin 69yo BF, on NC, no effort, A & O x3  Lungs clear  Heart reg, rate now ok  abd soft  No tpp  Ext no edema  pscyh wnl  No focal motor defectis, Did not amulate patient.  ??    Lab Results   Component Value Date    WBC 6.3 05/07/2020    HGB 11.3 (L) 05/07/2020    HCT 34.1 (L) 05/07/2020    MCV 98.7 (H) 05/07/2020    PLT 216 05/07/2020    LYMPHOPCT 30.9 05/07/2020    GRANULOCYTES 53.6 05/07/2020    RBC 3.46 (L) 05/07/2020    MCH 32.7 05/07/2020    MCHC 33.1 05/07/2020    RDW 14.4 05/07/2020       Lab Results   Component Value Date    NA 141 05/06/2020    K 3.8 05/06/2020    CL 112 (H) 05/06/2020    CO2 23 05/06/2020    BUN 10 05/06/2020    CREATININE 0.86 05/06/2020    GLUCOSE 89 05/06/2020    CALCIUM 8.5 05/06/2020    PROT 5.6 (L) 05/06/2020    LABALBU  3.0 (L) 05/06/2020    BILITOT 0.4 05/06/2020    ALKPHOS 45 05/06/2020    AST 32 05/06/2020    ALT 19 05/06/2020       Current Medications:      Orders Placed This Encounter   Medications   ??? DISCONTD: morphine sulfate (PF) injection 4 mg   ??? ondansetron (ZOFRAN) injection 4 mg   ??? 0.9 % sodium chloride bolus   ??? sodium chloride flush 0.9 % injection 5-40 mL   ??? sodium chloride flush 0.9 % injection 5-40 mL   ??? 0.9 % sodium chloride infusion   ??? AND Linked Order Group    ??? cefTRIAXone sodium 1,000 mg in dextrose 5 % 50 mL IVPB (add-vantage)      Order Specific Question:   Antimicrobial Indications      Answer:   Pneumonia (CAP)    ??? azithromycin (ZITHROMAX)  500 mg in NS addavial      Order Specific Question:   Antimicrobial Indications      Answer:   Pneumonia (CAP)   ??? ketorolac (TORADOL) injection 15 mg   ??? albuterol (PROVENTIL) nebulizer solution 2.5 mg     Order Specific Question:   Initiate RT Bronchodilator Protocol     Answer:   Yes   ??? acetaminophen (TYLENOL) tablet 1,000 mg   ??? DISCONTD: alendronate (FOSAMAX) tablet 70 mg   ??? amLODIPine (NORVASC) tablet 5 mg   ??? atorvastatin (LIPITOR) tablet 20 mg   ??? benztropine (COGENTIN) tablet 0.5 mg   ??? DISCONTD: Calcium + D3 600-200 MG-UNIT tablet 1 tablet   ??? dicyclomine (BENTYL) tablet 20 mg   ??? docusate sodium (COLACE) capsule 100 mg   ??? lisinopril (PRINIVIL;ZESTRIL) tablet 40 mg   ??? cyclobenzaprine (FLEXERIL) tablet 10 mg   ??? metoprolol (LOPRESSOR) tablet 100 mg   ??? metoprolol tartrate (LOPRESSOR) tablet 50 mg   ??? tamoxifen (NOLVADEX) tablet 20 mg   ??? guaiFENesin (MUCINEX) extended release tablet 600 mg   ??? DISCONTD: albuterol (PROVENTIL) nebulizer solution 2.5 mg     Order Specific Question:   Initiate RT Bronchodilator Protocol     Answer:   Yes   ??? DISCONTD: cefTRIAXone sodium 1,000 mg in dextrose 5 % 50 mL IVPB (add-vantage)     Order Specific Question:   Antimicrobial Indications     Answer:   Pneumonia (CAP)   ??? DISCONTD: azithromycin (ZITHROMAX) 500 mg in NS  addavial     Order Specific Question:   Antimicrobial Indications     Answer:   Pneumonia (CAP)   ??? calcium-cholecalciferol 500-200 MG-UNIT per tablet 1 tablet   ??? DISCONTD: cefTRIAXone sodium 1,000 mg in dextrose 5 % 50 mL IVPB (add-vantage)     Order Specific Question:   Antimicrobial Indications     Answer:   Pneumonia (CAP)   ??? DISCONTD: azithromycin (ZITHROMAX) 500 mg in NS addavial     Order Specific Question:   Antimicrobial Indications     Answer:   Pneumonia (CAP)   ??? cefTRIAXone sodium 1,000 mg in dextrose 5 % 50 mL IVPB (add-vantage)     Order Specific Question:   Antimicrobial Indications     Answer:   Pneumonia (CAP)   ??? DISCONTD: azithromycin (ZITHROMAX) tablet 500 mg     Order Specific Question:   Antimicrobial Indications     Answer:   Pneumonia (CAP)   ??? albuterol (PROVENTIL) nebulizer solution 2.5 mg     Order Specific Question:   Initiate RT Bronchodilator Protocol     Answer:   Yes   ??? aluminum & magnesium hydroxide-simethicone (MAALOX) 200-200-20 MG/5ML suspension 30 mL   ??? gabapentin (NEURONTIN) capsule 100 mg       Assessment/Plan       Penumona-await pulm input, cont curretn tx  Underweight-add supplements  Weakness-seen by PT, does not want SNF, will cont to discuss  HTN=norvasd, metoprolol  HLD=hme med  Lactic acidosis=resolved  Hx of breat cancer  Tachycardia-improoved with flids  Hx fof COVID 02/2019      Doing lot better, home next 1-2 days. Await pulm inpurt, spoke with RN

## 2020-05-09 MED ORDER — DICLOFENAC SODIUM 1 % EX GEL
1 % | Freq: Two times a day (BID) | CUTANEOUS | Status: DC
Start: 2020-05-09 — End: 2020-05-10
  Administered 2020-05-10 (×2): via TOPICAL

## 2020-05-09 MED FILL — MUCUS RELIEF ER 600 MG PO TB12: 600 mg | ORAL | Qty: 1

## 2020-05-09 MED FILL — LISINOPRIL 20 MG PO TABS: 20 mg | ORAL | Qty: 2

## 2020-05-09 MED FILL — METOPROLOL TARTRATE 100 MG PO TABS: 100 mg | ORAL | Qty: 1

## 2020-05-09 MED FILL — CEFTRIAXONE SODIUM 1 G IV SOLR: 1 g | INTRAVENOUS | Qty: 1

## 2020-05-09 MED FILL — OYSTER SHELL CALCIUM/D 500-5 MG-MCG PO TABS: 500-5 | ORAL | Qty: 1

## 2020-05-09 MED FILL — METOPROLOL TARTRATE 50 MG PO TABS: 50 mg | ORAL | Qty: 1

## 2020-05-09 MED FILL — DICLOFENAC SODIUM 1 % EX GEL: 1 % | CUTANEOUS | Qty: 100

## 2020-05-09 MED FILL — PREDNISONE 10 MG PO TABS: 10 mg | ORAL | Qty: 1

## 2020-05-09 MED FILL — AMLODIPINE BESYLATE 5 MG PO TABS: 5 mg | ORAL | Qty: 1

## 2020-05-09 MED FILL — GABAPENTIN 100 MG PO CAPS: 100 mg | ORAL | Qty: 1

## 2020-05-09 MED FILL — ATORVASTATIN CALCIUM 20 MG PO TABS: 20 mg | ORAL | Qty: 1

## 2020-05-09 MED FILL — TAMOXIFEN CITRATE 10 MG PO TABS: 10 mg | ORAL | Qty: 2

## 2020-05-09 MED FILL — BENZTROPINE MESYLATE 0.5 MG PO TABS: 0.5 mg | ORAL | Qty: 1

## 2020-05-09 NOTE — Progress Notes (Signed)
[facility] IM Inpatient Service   Daily Progress Note      SUBJECTIVE  Feels well.  Breathing ok. Complains of pain in left knee (?chronic)    OBJECTIVE    Physical  VITALS:  BP (!) 150/115    Pulse 103    Temp 98.1 ??F (36.7 ??C) (Temporal)    Resp 17    Ht 5\' 4"  (1.626 m)    Wt 130 lb (59 kg)    SpO2 98%    BMI 22.31 kg/m??   CONSTITUTIONAL:  awake, alert, cooperative, no apparent distress, and appears stated age  LUNGS:  no increased work of breathing and clear to auscultation  CARDIOVASCULAR:  regular rate and rhythm  ABDOMEN:  normal bowel sounds and non-tender  MUSCULOSKELETAL:  there is no redness, warmth, or swelling of the joints  Data  CBC with Differential:    Lab Results   Component Value Date    WBC 6.3 05/07/2020    RBC 3.46 05/07/2020    HGB 11.3 05/07/2020    HCT 34.1 05/07/2020    PLT 216 05/07/2020    MCV 98.7 05/07/2020    MCH 32.7 05/07/2020    MCHC 33.1 05/07/2020    RDW 14.4 05/07/2020    LYMPHOPCT 30.9 05/07/2020    MONOPCT 10.0 05/07/2020    BASOPCT 0.9 05/07/2020    MONOSABS 0.6 05/07/2020    LYMPHSABS 1.9 05/07/2020    EOSABS 0.3 05/07/2020    BASOSABS 0.1 05/07/2020     BMP:    Lab Results   Component Value Date    NA 141 05/06/2020    K 3.8 05/06/2020    CL 112 05/06/2020    CO2 23 05/06/2020    BUN 10 05/06/2020    LABALBU 3.0 05/06/2020    CREATININE 0.86 05/06/2020    CALCIUM 8.5 05/06/2020    GLUCOSE 89 05/06/2020     Current Medications  Current Facility-Administered Medications: diclofenac sodium (VOLTAREN) 1 % gel 2 g, 2 g, Topical, BID  predniSONE (DELTASONE) tablet 10 mg, 10 mg, Oral, Daily  albuterol (PROVENTIL) nebulizer solution 2.5 mg, 2.5 mg, Nebulization, BID  aluminum & magnesium hydroxide-simethicone (MAALOX) 200-200-20 MG/5ML suspension 30 mL, 30 mL, Oral, Q6H PRN  gabapentin (NEURONTIN) capsule 100 mg, 100 mg, Oral, Nightly  sodium chloride flush 0.9 % injection 5-40 mL, 5-40 mL, IntraVENous, 2 times per day  sodium chloride flush 0.9 % injection 5-40 mL, 5-40  mL, IntraVENous, PRN  0.9 % sodium chloride infusion, 25 mL, IntraVENous, PRN  acetaminophen (TYLENOL) tablet 1,000 mg, 1,000 mg, Oral, TID PRN  amLODIPine (NORVASC) tablet 5 mg, 5 mg, Oral, Daily  atorvastatin (LIPITOR) tablet 20 mg, 20 mg, Oral, Daily  benztropine (COGENTIN) tablet 0.5 mg, 0.5 mg, Oral, Nightly  dicyclomine (BENTYL) tablet 20 mg, 20 mg, Oral, 4x Daily PRN  docusate sodium (COLACE) capsule 100 mg, 100 mg, Oral, BID PRN  lisinopril (PRINIVIL;ZESTRIL) tablet 40 mg, 40 mg, Oral, Daily  cyclobenzaprine (FLEXERIL) tablet 10 mg, 10 mg, Oral, TID PRN  metoprolol (LOPRESSOR) tablet 100 mg, 100 mg, Oral, Daily  metoprolol tartrate (LOPRESSOR) tablet 50 mg, 50 mg, Oral, Dinner  tamoxifen (NOLVADEX) tablet 20 mg, 20 mg, Oral, Daily  guaiFENesin (MUCINEX) extended release tablet 600 mg, 600 mg, Oral, BID  calcium-cholecalciferol 500-200 MG-UNIT per tablet 1 tablet, 1 tablet, Oral, BID    ASSESSMENT AND PLAN      Active Hospital Problems    Pneumonia      History of  breast cancer      Tachycardia      Lactic acid acidosis      Hypertension       No sign of pneumonia at this point   -D/C antibiotics    Check xray of left knee - she is unclear on timeframe of pain   -Voltaren gel    Working towards snf at discharge.    Glendora Score, DO  05/09/20   10:31 AM
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
## 2020-05-09 NOTE — Telephone Encounter (Signed)
Please schedule appointment with Dr Nash Shearer for RUL scarring   Needs first available
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
## 2020-05-09 NOTE — Care Coordination-Inpatient (Signed)
OP pulmonary follow up scheduled with Dr Dagoberto Ligas 2/24 at 9:15am.  Pulm service signing off.  Please call with further questions or concerns

## 2020-05-10 MED ORDER — ALBUTEROL SULFATE (2.5 MG/3ML) 0.083% IN NEBU
RESPIRATORY_TRACT | Status: DC | PRN
Start: 2020-05-10 — End: 2020-05-10

## 2020-05-10 MED ORDER — DICLOFENAC SODIUM 1 % EX GEL
1 % | Freq: Two times a day (BID) | CUTANEOUS | 1 refills | Status: AC
Start: 2020-05-10 — End: ?

## 2020-05-10 MED FILL — OYSTER SHELL CALCIUM/D 500-5 MG-MCG PO TABS: 500-5 | ORAL | Qty: 1

## 2020-05-10 MED FILL — BENZTROPINE MESYLATE 0.5 MG PO TABS: 0.5 mg | ORAL | Qty: 1

## 2020-05-10 MED FILL — MUCUS RELIEF ER 600 MG PO TB12: 600 mg | ORAL | Qty: 1

## 2020-05-10 MED FILL — METOPROLOL TARTRATE 50 MG PO TABS: 50 mg | ORAL | Qty: 1

## 2020-05-10 MED FILL — AMLODIPINE BESYLATE 5 MG PO TABS: 5 mg | ORAL | Qty: 1

## 2020-05-10 MED FILL — LISINOPRIL 20 MG PO TABS: 20 mg | ORAL | Qty: 2

## 2020-05-10 MED FILL — TAMOXIFEN CITRATE 10 MG PO TABS: 10 mg | ORAL | Qty: 2

## 2020-05-10 MED FILL — ATORVASTATIN CALCIUM 20 MG PO TABS: 20 mg | ORAL | Qty: 1

## 2020-05-10 MED FILL — PREDNISONE 10 MG PO TABS: 10 mg | ORAL | Qty: 1

## 2020-05-10 MED FILL — GABAPENTIN 100 MG PO CAPS: 100 mg | ORAL | Qty: 1

## 2020-05-10 MED FILL — METOPROLOL TARTRATE 100 MG PO TABS: 100 mg | ORAL | Qty: 1

## 2020-05-10 NOTE — Discharge Summary (Signed)
[facility] Discharge Summary    Patient ID: Dawn Padilla  MRN: [id number]          Patient's PCP: America Brown, DO    Admit Date: 05/05/2020   Discharge Date:   05/10/2020     Admitting Physician: Stephens November  Discharge Physician: Glendora Score, DO     Active Discharge Diagnoses:  Primary Problem  Pneumonia  Hospital Problems  Active Hospital Problems    Diagnosis Date Noted   ??? Pneumonia [J18.9] 05/05/2020   ??? History of breast cancer [Z85.3] 05/05/2020   ??? Tachycardia [R00.0] 02/17/2019   ??? Lactic acid acidosis [E87.2] 07/16/2017   ??? Hypertension [I10] 07/16/2017       The patient was seen and examined on day of discharge and this discharge summary is in conjunction with any daily progress note from day of discharge.    Code Status:  Prior    Hospital Course:  Admitted with sob, felt initially to be pneumonia.  Had cxr changes consistent with pneumonia.  She was evaluated by pulmonology, antibiotics discontinued.  She does have some chronic lung changes/scarring on ct scan.  She will be followed by pulmonology as outpatient.       Consult(s):  IP CONSULT TO SOCIAL WORK  IP CONSULT TO PULMONOLOGY    Procedure(s):  IP CONSULT TO SOCIAL WORK  IP CONSULT TO PULMONOLOGY    Discharged Condition: Stable    Follow Up: [place] [address]  [phone]            RECOMMENDED NEXT STEPS: follow up with DR. Schukay 3-5 days.    DISPOSITION: Home    Diet: ADULT DIET; Regular  ADULT ORAL NUTRITION SUPPLEMENT; Breakfast, Dinner; Standard High Calorie/High Protein Oral Supplement  ADULT ORAL NUTRITION SUPPLEMENT; Lunch, HS Snack; Clear Liquid Oral Supplement    Discharge Medications:      Medication List      START taking these medications    diclofenac sodium 1 % Gel  Commonly known as: VOLTAREN  Apply 2 g topically 2 times daily        CONTINUE taking these medications    alendronate 70 MG tablet  Commonly known as: FOSAMAX     amLODIPine 5 MG  tablet  Commonly known as: NORVASC     atorvastatin 20 MG tablet  Commonly known as: LIPITOR     benztropine 0.5 MG tablet  Commonly known as: COGENTIN     Calcium + D3 600-200 MG-UNIT Tabs tablet     cyclobenzaprine 10 MG tablet  Commonly known as: FLEXERIL     dicyclomine 20 MG tablet  Commonly known as: BENTYL     docusate 100 MG Caps  Commonly known as: COLACE, DULCOLAX  Take 100 mg by mouth 2 times daily as needed for Constipation     fluticasone 50 MCG/ACT nasal spray  Commonly known as: FLONASE     gabapentin 100 MG capsule  Commonly known as: NEURONTIN     lisinopril 40 MG tablet  Commonly known as: PRINIVIL;ZESTRIL     loperamide 2 MG capsule  Commonly known as: IMODIUM     * metoprolol 100 MG tablet  Commonly known as: LOPRESSOR     * metoprolol tartrate 50 MG tablet  Commonly known as: LOPRESSOR     Natural Vitamin D-3 125 MCG (5000 UT) Tabs tablet  Generic drug: vitamin D3  predniSONE 10 MG tablet  Commonly known as: DELTASONE     ProAir HFA 108 (90 Base) MCG/ACT inhaler  Generic drug: albuterol sulfate HFA     promethazine 25 MG tablet  Commonly known as: PHENERGAN     tamoxifen 20 MG tablet  Commonly known as: NOLVADEX  Take 1 tablet by mouth daily     TYLENOL 500 MG tablet  Generic drug: acetaminophen         * This list has 2 medication(s) that are the same as other medications prescribed for you. Read the directions carefully, and ask your doctor or other care provider to review them with you.            STOP taking these medications    amoxicillin-clavulanate 875-125 MG per tablet  Commonly known as: AUGMENTIN     Breast Prosthesis Misc     cephALEXin 250 MG capsule  Commonly known as: KEFLEX     Mastectomy Bra Misc     Surgical Bra Misc           Where to Get Your Medications      These medications were sent to [place], [place] - [address] - P [id number] - F [phone]  [address], [place] [address]    Phone:  [phone]   ?? diclofenac sodium 1 % Gel         Time Spent on discharge is  35 mins in patient examination, evaluation, counseling as well as medication reconciliation, prescriptions for required medications, discharge plan and follow up.    Electronically signed by Glendora Score, DO on 05/10/2020 at 12:07 PM     Thank you Dr. America Brown, DO for the opportunity to be involved in this patient's care.

## 2020-05-10 NOTE — Discharge Instructions (Signed)
***********************************************************************  ************************************************************************      Your physician has ordered skilled home care services for you.                   Your home care will be provided by:                       [facility] [place] [facility]                      [phone]      ********************************************************************  **********************************************************************

## 2020-05-10 NOTE — Other (Unsigned)
Patient Acct Nbr: [id number]   Primary AUTH/CERT:   [place] Name: [place] name: [facility] Dual Complete  Primary Insurance Group Number: [employer]  Primary Insurance Plan Type: Health  Primary Insurance Policy Number: [id number]    Secondary AUTH/CERT:   [place] Name: [employer]  Secondary Insurance Plan name: Medicare IME  Secondary Insurance Group Number:   Secondary Insurance Plan Type: IME  Secondary Insurance Policy Number: [id number]

## 2020-05-10 NOTE — Care Coordination-Inpatient (Signed)
Length of Stay: 5     GMLOS: 3.1     Anticipated date of discharge: 05/10/2020    Medical Plan of Care: Medically stable for discharge to home today with grd dtr. Met with pt to discuss d/c plan and therapy recommendation. Pt ambulated 25 feet with minimal assistance. Pt does NOT want to go to rehab/SNF- only home but is agreeable to [facility] services. [facility] following. Pt is active with passport.     DC Disposition: Home with home care: home care liaison following    Discharge Barriers: No   Electronically signed by Montez Hageman, RN on 05/10/20 at 12:11 PM EST

## 2020-05-10 NOTE — Care Coordination-Inpatient (Unsigned)
Admission Date: 05/05/2020 05:30 PM  Patient Name:  Dawn, Padilla  MRN:  [id number]  Account #:  [id number]  Location: 1A [id number]  Date of Birth:  05/26/1950  ------------------------------------------------------------------------------  ------------------------------------------------------------------------  Placement Information  ------------------------------------------------------------------------------  ------------------------------------------------------------------------  Referral Type: [facility] - Resume                                Referral ID: [id number]  Provider Name:   Address 1:                                                                       Phone Number:   Address 2:                                                                       Fax Number:   City:                                                                            Selection Factors:   State:      Referral Type: [place]                                   Referral ID: [id number]  Provider Name: [place]  Address 1: [address]                                               Phone Number: [phone]  Address 2:                                                                       Fax Number: [phone]  City: [place]                                                                      Selection Factors: Patient/Family Choice  State: [place]

## 2020-05-10 NOTE — Care Coordination-Inpatient (Signed)
Pt active with Passport services.   I called and left vm for CM Radene Ou [phone] informing of pts d/c home today.  Electronically signed by Eudelia Bunch, LSW on 05/10/2020 at 12:08 PM

## 2020-05-10 NOTE — Care Coordination-Inpatient (Signed)
Asked by TCC to set transport to pts home.   Cot transportation arranged through [facility] for [phone] pick up.  Pt, nurse, unit sec, TCC  informed of time.   Electronically signed by Eudelia Bunch, LSW on 05/10/2020 at 2:44 PM

## 2020-05-10 NOTE — IP Home Care (Addendum)
Home care liaison following case peripherally for discharge needs.    Spoke to patient regarding home care  Services, pt would like PT/OT services. Pt states she is active with [facility] for non skilled and has [facility]HA/nurse. Will verify with [facility] for skilled care. Referral sent in care port.  [facility] is active with nursing and [facility] for non- skilled. They will be able to provide the PT/OT services.   [facility] accepted for skilled  Home care services.  Electronically signed by Dorris Carnes, RN on 05/10/20 at 12:26 PM EST

## 2020-05-10 NOTE — Care Coordination-Inpatient (Unsigned)
Patient Choice     Patient Name:  Dawn Padilla, Dawn Padilla  MRN:  [id number]  Account #:  [id number]  Date of Birth:  05-10-1950
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
## 2020-05-11 LAB — CULTURE, BLOOD 2: Blood Culture, Routine: NO GROWTH

## 2020-05-11 LAB — CULTURE, BLOOD 1: Blood Culture, Routine: NO GROWTH

## 2020-05-11 NOTE — Care Coordination-Inpatient (Signed)
Chart reviewed for JVION ACR. TC made to [place]. Educated on reason for call and role of CM. She tells me she is doing pretty good today. She does report DOE but none when sitting still. She is not on O2 at this time. She tells me she has a followup appointment scheduled for tomorrow and I did remind her of the on on 2/24 with the pulmonologist. Her main concern currently is getting her driveway cleared for [facility] to come back. She has spoken with [facility], with whom she was active with prior. She tells me she has her medications and her granddaughter is there now to assist her as needed. Mittie still drives and does her own ADLs with minimal assistance. At this time she denies any needs from this CM. I did educate her on worsening SOB and when to contact emergency services and she verbalized understanding. AT this time time will DC from [facility] service as recommendations have been completed and will await new recommendations.

## 2020-05-12 ENCOUNTER — Encounter: Attending: Hematology & Oncology | Primary: Family Medicine

## 2020-05-24 NOTE — Other (Unsigned)
Patient Acct Nbr: [id number]   Primary AUTH/CERT:   [place] Name: [place] name: [facility] Dual Complete  Primary Insurance Group Number: [employer]  Primary Insurance Plan Type: Health  Primary Insurance Policy Number: [id number]    Secondary AUTH/CERT:   [place] Name: [employer] Plan name: [facility] MyCrMcaid Second  Secondary Insurance Group Number: [employer]  Secondary Insurance Plan Type: Barrister's clerk Number: [id number]

## 2020-05-26 ENCOUNTER — Ambulatory Visit: Admit: 2020-05-26 | Discharge: 2020-05-26 | Payer: MEDICARE | Attending: Internal Medicine | Primary: Family Medicine

## 2020-05-26 DIAGNOSIS — J410 Simple chronic bronchitis: Secondary | ICD-10-CM
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
## 2020-05-26 NOTE — Patient Instructions (Signed)
YOUR APPOINTMENT TODAY WAS WITH THE [facility] LUNG NODULE CLINIC, COPD CLINIC, PULMONARY AND SLEEP MEDICINE OFFICE.    PLEASE CALL OUR OFFICE AT [phone] IF YOU HAVE NOT RECEIVED YOUR TEST RESULTS 7 DAYS AFTER TESTING IS COMPLETED.    PLEASE REMEMBER TO REQUEST REFILLS AT YOUR OFFICE VISITS.  PHONE/FAX REQUESTS REQUIRE 48-72 HOURS FOR RESPONSE.    AS A FRIENDLY REMINDER COPAYS ARE DUE AT TIME OF SERVICE. THANK YOU.     Our Patients Are Important! We want to improve and you can help.    After your visit we want you to feel:   ??? Listened to,   ??? Respected and have your health care explained.     You may receive a survey asking you about your visit. Please complete the survey. We will use your feedback to make improvements.     COVID-19 VACCINATION INFORMATION:    PH.  [phone]  [email or web address].ORG/CORONAVIRUS/VACCINE

## 2020-05-30 ENCOUNTER — Ambulatory Visit
Admit: 2020-05-30 | Discharge: 2020-05-30 | Payer: MEDICARE | Attending: Hematology & Oncology | Primary: Family Medicine

## 2020-05-30 DIAGNOSIS — C50112 Malignant neoplasm of central portion of left female breast: Secondary | ICD-10-CM

## 2020-05-30 NOTE — Progress Notes (Signed)
HPI  Dawn Padilla is a 70 y.o.  female who comes in for a follow-up.  She continues on tamoxifen without any problems.  She is getting a lung lesion evaluated by Dr. Dagoberto Ligas and bronchoscopy/biopsy is planned.     she feels well.  Apettite  and energy levels are good.  No hot flashes.  Hair thinning has resolved.    she has the following oncology history  1.  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  2.  Currently on tamoxifen since 11/16/2013.  3.  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   HENT: Negative for mouth sores and trouble swallowing.    Eyes: Negative for photophobia and visual disturbance.   Respiratory: Negative for cough and shortness of breath.    Cardiovascular: Negative for chest pain.   Gastrointestinal: Negative for abdominal pain, diarrhea, nausea and vomiting.   Genitourinary: Negative for dysuria and hematuria.   Musculoskeletal: Negative for arthralgias and myalgias.   Skin: Negative for rash.   Neurological: Negative for seizures, light-headedness and headaches.   Hematological: Negative for adenopathy. Does not bruise/bleed easily.   Psychiatric/Behavioral: Negative for sleep disturbance.         Family History   Problem Relation Age of Onset   . Cancer Mother         lung   . Cancer Father         stomach   . Cancer Sister         breast   . Cancer Maternal Grandmother         colon     Social History     Tobacco Use   . Smoking status: Never Smoker   . Smokeless tobacco: Never Used   Substance Use Topics   . Alcohol use: No       Vitals:    05/30/20 1019   BP: 134/75   Pulse: 79   Temp: 97.2 F (36.2 C)   SpO2: 95%     Breast examination reveals well-healed bilateral mastectomy scars with no tumor nodules.    Lab Results   Component Value Date    WBC 6.3 05/07/2020    HGB 11.3 (L) 05/07/2020    HCT 34.1 (L) 05/07/2020     MCV 98.7 (H) 05/07/2020    PLT 216 05/07/2020   Last mammogram July 2019 is negative.    Assessment/Plan:  Shellie was seen today for follow-up.    Diagnoses and all orders for this visit:    Malignant neoplasm of central portion of left breast in female, estrogen receptor positive ([place])    1. she appears to be in complete remission. Will do only symptom guided imaging.    2. 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 daily.  Treatment intent is curative.  Timing is adjuvant.  Planned duration is 10 years.  Response Rate: NA since adjuvant  I have discussed some of the side efffects that include but not limited to arthralgias, myalgias, vaginal dryness, hair thinning, hot flashes, osteopenia.  Prognosis: Five-year disease-free survival 85%.  Absolute benefit of tamoxifen 4%.  NCCN guidelines were used.  She consents to treatment. Shared decision making was performed.  Compliance assessed. Patient is taking medication at appropriate dose and schedule.  3. [name] on diet and exercise.  Patient verbalizes understanding and agrees with the plan.    4.  No need for mammogram since she has had bilateral mastectomy.    5.  Lung lesion evaluation per pulmonary    Breast Cancer Surveillance:  1. History and physical exam 1-4 times per year for 5 years, then annually  2. For women on tamoxifen: annual gynecologic assessment every 12 mo if uterus present  3. Encouraged physical activity, healthy diet, limiting alcohol intake, and achieving and maintaining a healthy body weight

## 2020-05-31 NOTE — Telephone Encounter (Signed)
Patient scheduled for ENB @ [facility] Main ENDO with Dr. Dagoberto Ligas 06/07/20 12:30 PM arrival time 11:00 AM Xray and Path F/U VV telephone 06/15/20 9:15 AM. Per Festus Holts T. @ [employer] PA not required. Surgery Scheduling case # [id number]. Patient denies taking blood thinners or diabetic medications. Reviewed instructions and prep with patient while in office, over the phone and mailed to patient as well. Verbalized understanding

## 2020-06-07 MED ORDER — ONDANSETRON HCL 4 MG/2ML IJ SOLN
4 | INTRAMUSCULAR | Status: AC
Start: 2020-06-07 — End: 2020-06-07

## 2020-06-07 MED ORDER — PROPOFOL 200 MG/20ML IV EMUL
200 | INTRAVENOUS | Status: AC
Start: 2020-06-07 — End: 2020-06-07

## 2020-06-07 MED ORDER — ROCURONIUM BROMIDE 50 MG/5ML IV SOLN
50 | INTRAVENOUS | Status: AC
Start: 2020-06-07 — End: 2020-06-07

## 2020-06-07 MED ORDER — DEXAMETHASONE SOD PHOSPHATE PF 10 MG/ML IJ SOLN
10 | INTRAMUSCULAR | Status: AC
Start: 2020-06-07 — End: 2020-06-07

## 2020-06-07 MED ORDER — ESMOLOL HCL 100 MG/10ML IV SOLN
100 | INTRAVENOUS | Status: AC
Start: 2020-06-07 — End: 2020-06-07

## 2020-06-07 MED ORDER — LIDOCAINE HCL (PF) 2 % IJ SOLN
2 | INTRAMUSCULAR | Status: AC
Start: 2020-06-07 — End: 2020-06-07

## 2020-06-07 MED FILL — ONDANSETRON HCL 4 MG/2ML IJ SOLN: 4 MG/2ML | INTRAMUSCULAR | Qty: 2

## 2020-06-07 MED FILL — XYLOCAINE-MPF 2 % IJ SOLN: 2 % | INTRAMUSCULAR | Qty: 5

## 2020-06-07 MED FILL — DIPRIVAN 200 MG/20ML IV EMUL: 200 MG/20ML | INTRAVENOUS | Qty: 20

## 2020-06-07 MED FILL — ROCURONIUM BROMIDE 50 MG/5ML IV SOLN: 50 MG/5ML | INTRAVENOUS | Qty: 5

## 2020-06-07 MED FILL — ESMOLOL HCL 100 MG/10ML IV SOLN: 100 MG/10ML | INTRAVENOUS | Qty: 10

## 2020-06-07 MED FILL — DEXAMETHASONE SOD PHOSPHATE PF 10 MG/ML IJ SOLN: 10 mg/mL | INTRAMUSCULAR | Qty: 1

## 2020-06-07 NOTE — Other (Unsigned)
Patient Acct Nbr: [id number]   Primary AUTH/CERT:   [place] Name: [place] name: [facility] Dual Complete  Primary Insurance Group Number: [employer]  Primary Insurance Plan Type: Health  Primary Insurance Policy Number: [id number]    Secondary AUTH/CERT:   [place] Name: [employer] Plan name: [facility] MyCrMcaid Second  Secondary Insurance Group Number: [employer]  Secondary Insurance Plan Type: Barrister's clerk Number: [id number]

## 2020-06-08 NOTE — Telephone Encounter (Signed)
Received call from ENDO that patient stated she was too hungry and ate breakfast. Procedure cancelled.

## 2020-06-08 NOTE — Telephone Encounter (Addendum)
Patient rescheduled for ENB 06/14/20 12:30 arrival time 11:00 am with Dr. Natividad Brood Case number [phone]. Patient is not on blood thinners or DM medications, PA not required per [facility] medicare, Dr. Dagoberto Ligas agreeable to admit patient as she does not have rides or anyone to help her after procedure, Dr. Dagoberto Ligas states f/u is not necessary as he can discuss results with patient after since she will be inpatient. Reviewed instructions and prep and importance of strict adherence to prep with patient again. Verbalized understanding.

## 2020-06-08 NOTE — Telephone Encounter (Signed)
First available VV f/u scheduled for 07/05/20. Patient also has a live 3 mo. F/u appt scheduled 6/22. Patient reminded PFT needs to be completed. Provided number to call central scheduling to schedule. Patient verbalized understanding.

## 2020-06-09 ENCOUNTER — Encounter: Attending: Internal Medicine | Primary: Family Medicine

## 2020-06-14 LAB — PNEUMONIA PANEL, MOLECULAR: Pneumonia PCR Panel: NEGATIVE

## 2020-06-14 MED ORDER — ACETAMINOPHEN 650 MG RE SUPP
650 MG | Freq: Four times a day (QID) | RECTAL | Status: DC | PRN
Start: 2020-06-14 — End: 2020-06-15

## 2020-06-14 MED ORDER — ONDANSETRON 4 MG PO TBDP
4 MG | Freq: Three times a day (TID) | ORAL | Status: DC | PRN
Start: 2020-06-14 — End: 2020-06-15

## 2020-06-14 MED ORDER — NORMAL SALINE FLUSH 0.9 % IV SOLN
0.9 | INTRAVENOUS | Status: DC | PRN
Start: 2020-06-14 — End: 2020-06-15

## 2020-06-14 MED ORDER — ONDANSETRON HCL 4 MG/2ML IJ SOLN
4 MG/2ML | Freq: Four times a day (QID) | INTRAMUSCULAR | Status: DC | PRN
Start: 2020-06-14 — End: 2020-06-15

## 2020-06-14 MED ORDER — ACETAMINOPHEN 325 MG PO TABS
325 MG | Freq: Four times a day (QID) | ORAL | Status: DC | PRN
Start: 2020-06-14 — End: 2020-06-15
  Administered 2020-06-14 – 2020-06-15 (×2): 650 mg via ORAL

## 2020-06-14 MED ORDER — POLYETHYLENE GLYCOL 3350 17 G PO PACK
17 g | Freq: Every day | ORAL | Status: DC | PRN
Start: 2020-06-14 — End: 2020-06-15

## 2020-06-14 MED ORDER — SODIUM CHLORIDE 0.9 % IV SOLN
0.9 % | INTRAVENOUS | Status: DC | PRN
Start: 2020-06-14 — End: 2020-06-15

## 2020-06-14 MED ORDER — NORMAL SALINE FLUSH 0.9 % IV SOLN
0.9 | Freq: Two times a day (BID) | INTRAVENOUS | Status: DC
Start: 2020-06-14 — End: 2020-06-15
  Administered 2020-06-15 (×2): via INTRAVENOUS

## 2020-06-14 MED FILL — ACETAMINOPHEN 325 MG PO TABS: 325 MG | ORAL | Qty: 2

## 2020-06-14 NOTE — Other (Unsigned)
Patient Acct Nbr: [id number]   Primary AUTH/CERT:   [place] Name: [place] name: [facility] Dual Complete  Primary Insurance Group Number: [employer]  Primary Insurance Plan Type: Health  Primary Insurance Policy Number: [id number]    Secondary AUTH/CERT:   [place] Name: [employer] Plan name: [facility] MyCrMcaid Second  Secondary Insurance Group Number: [employer]  Secondary Insurance Plan Type: Barrister's clerk Number: [id number]
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
## 2020-06-14 NOTE — Progress Notes (Signed)
Report called to [facility] on H5

## 2020-06-14 NOTE — Unknown (Incomplete)
##  BASIC INFO##  ##NAME##Dawn Padilla##END NAME##  ##MRN##2767663##END MRN##  ##DOB##07-11-1948##END DOB##  ##AGE##72 y.o.##END AGE##  ##GENDER##female##END GENDER##  ##END BASIC INFO##    ##PATIENT INFO##  ##HEIGHT##Height: 5\' 4"  (162.6 cm)##END HEIGHT##  ##WEIGHT##Weight: 140 lb (63.5 kg)##END WEIGHT##  ##BMI##BMI (Calculated): 24.1##END BMI##  ##END PATIENT INFO##    ##ALLERGIES##  Codeine, Lansoprazole, Nsaids, and Other  ##END ALLERGIES##    ##PAST MED HISTORY##   has a past medical history of Acid reflux, Asthma, Bleeding ulcer, Blurry vision, Breast cancer (Upham), Breast neoplasm, Tis (DCIS), Chronic back pain, COVID, Headache, Hiatal hernia, History of blood transfusion, Hyperlipidemia, Hypertension, MVA (motor vehicle accident), Neuropathy, Osteoarthritis, and Parkinson disease (Holloway).  ##END PAST MED HISTORY##    ##PAST SURGICAL HISTORY##   has a past surgical history that includes Hysterectomy (1977); Lung surgery (Right, 1976); brain surgery (2003); Carotid endarterectomy (2006); Appendectomy; Cholecystectomy; Tonsillectomy; Colonoscopy; Upper gastrointestinal endoscopy; Breast biopsy (Left, 1974); Breast biopsy (Left, 08/17/13); Breast surgery (Left, 10/13/13); and other surgical history (Right, 12/15/2018).  ##END PAST SURGICAL HISTORY##    ##INPATIENT MEDS##  No current facility-administered medications for this encounter.  ##END INPATIENT MEDS##    ##OUTPATIENT MEDS##  Calcium + D3, acetaminophen, albuterol sulfate HFA, alendronate, amLODIPine, atorvastatin, benztropine, cyclobenzaprine, diclofenac sodium, dicyclomine, docusate, fluticasone, gabapentin, lisinopril, loperamide, metoprolol, metoprolol tartrate, predniSONE, promethazine, tamoxifen, and vitamin D3  ##END OUTPATIENT MEDS##    ##TOBACCO##   reports that she has never smoked. She has never used smokeless tobacco.  ##END TOBACCO##    ##ALCOHOL##   reports no history of alcohol use.  ##END ALCOHOL##    ##DRUG##   reports no history of drug  use.  ##END DRUG##    ##FAMILY##  family history includes Cancer in her father, maternal grandmother, mother, and sister.  ##END FAMILY##    ##PROBLEM LIST##  has Malignant neoplasm of left breast (Southeast Arcadia); Encounter for follow-up surveillance of breast cancer; Lactic acid acidosis; Hypertension; Asthma; Norovirus; Severe malnutrition (Ozawkie); Ductal carcinoma in situ of right breast; S/P right mastectomy; Sinus tachycardia; Acute bronchitis due to COVID-19 virus; Sepsis due to COVID-19 Healthmark Regional Medical Center); Shortness of breath; Immunocompromised due to corticosteroids (Treasure); Transaminitis; Tachycardia; Pneumonia; and History of breast cancer on their problem list.  ##END PROBLEM LIST##    ##Now##  12:28 PM  ##END NOW##

## 2020-06-15 ENCOUNTER — Encounter: Attending: Internal Medicine | Primary: Family Medicine

## 2020-06-15 LAB — FUNGAL STAIN
Fungus Stain: NONE SEEN
Fungus Stain: NONE SEEN
Fungus Stain: NONE SEEN

## 2020-06-15 LAB — SILVER STAIN
STAIN SILVER: NONE SEEN
STAIN SILVER: NONE SEEN
STAIN SILVER: NONE SEEN

## 2020-06-15 LAB — CYTOLOGY, NON-GYN

## 2020-06-15 MED FILL — ACETAMINOPHEN 325 MG PO TABS: 325 MG | ORAL | Qty: 2

## 2020-06-15 NOTE — Progress Notes (Signed)
Progress Note    06/15/2020   9:48 AM    Name:  Dawn Padilla  MRN:    [id number]     IP Day: 0     Admit Date: 06/14/2020 11:14 AM  PCP: America Brown, DO    Code Status:  Full Code    Subjective:      pt seen and examined. Home today, no ride available yest after egd and was unable to drive self home after anesthesia. No overnight events.     Physical Examination:      Vitals:  BP (!) 119/58    Pulse 87    Temp 98.2 ??F (36.8 ??C) (Temporal)    Resp 18    Ht _0  (1.626 m)    Wt 140 lb (63.5 kg)    SpO2 95%    BMI 24.03 kg/m??   Temp (24hrs), Avg:97.5 ??F (36.4 ??C), Min:97 ??F (36.1 ??C), Max:98.3 ??F (36.8 ??C)      General appearance: alert, cooperative and no distress  Mental Status: oriented to person, place and time and normal affect  Lungs: clear to auscultation bilaterally, normal effort  Heart: regular rate and rhythm, no murmur  Abdomen: soft, nontender, nondistended, bowel sounds present, no masses  Extremities: no edema, redness, tenderness in the calves  Skin: no gross lesions, rashes    Data:     I/O (24Hr):  No intake or output data in the 24 hours ending 06/15/20 0948    Labs:  No results for input(s): WBC, HGB, PLT in the last 72 hours.  No results for input(s): NA, K, CL, CO2, BUN, CREATININE, GLUCOSE in the last 72 hours.  No results for input(s): AST, ALT, ALB, BILITOT, ALKPHOS in the last 72 hours.    Assessment and Plan:        1)  S/p egd- home with follow up w pcp           2)  Asthma- stable           3)  htn- continue home meds           4)  Hx brca           [place] with follow up.        Electronically signed by Dorathy Daft, DO,DO on 06/15/2020 at 9:48 AM

## 2020-06-15 NOTE — Care Coordination-Inpatient (Signed)
[place] ([place] patients) goes back to going thru [facility].     When ordering transport for a patient, be certain to state it is for a hospital discharge, whether patient is going to a facility or home.     The phone number at [facility] that we are to call is [phone]    Members ID #: [id number]    Electronically signed by Eudelia Bunch, LSW on 06/15/2020 at 9:42 AM

## 2020-06-15 NOTE — Discharge Instructions (Signed)
Good nutrition is important when healing from an illness, injury, or surgery.  Follow any nutrition recommendations given to you during your hospital stay.   If you were given an oral nutrition supplement while in the hospital, continue to take this supplement at home.  You can take it with meals, in-between meals, and/or before bedtime. These supplements can be purchased at most local grocery stores, pharmacies, and chain super-stores.   If you have any questions about your diet or nutrition, call the hospital and ask for the dietitian.  Regular diet

## 2020-06-15 NOTE — Discharge Instructions (Signed)
As tolerated
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
## 2020-06-15 NOTE — Care Coordination-Inpatient (Signed)
Discharge Planning  Current Residence: Private Residence  Living Arrangements: Family Members  Support Systems: Family Members  Current Services Prior To Admission: ESP/Passport  Potential Assistance Needed: ESP/Passport  Potential Assistance Purchasing Medications: No  Meds-to-Beds: Does the patient want to have any new prescriptions delivered to bedside prior to discharge?: Yes  Type of Home Care Services: None  Patient expects to be discharged to:: House  Expected Discharge Date: 06/15/20      Emergency Contacts  Contact 1: Name: Sheliah Hatch (Other) [phone]  Social/Functional History  Lives With: Family  Type of Home: House  Home Layout: Two level,Able to Live on Main level with bedroom/bathroom  Home Access: Stairs to enter with rails  Entrance Stairs - Number of Steps: 2  Bathroom Shower/Tub: Administrator, Civil Service: Bedside commode  Bathroom Equipment: Shower chair,3-in-1 commode,Grab bars in Designer, multimedia Accessibility: Accessible  Home Equipment: [place] Help From: Family  ADL Assistance: Needs assistance  Homemaking Assistance: Needs assistance  Ambulation Assistance: Independent  Transfer Assistance: Independent  Active Driver: Yes  Occupation: Retired    Event organiser with the patient via telephone. Introduced self and role. Discussed discharge planning needs. Has health insurance and Rx coverage. Able to afford copays. Has help at home and transportation available upon discharge. Patient plans to drive herself home once she is safe to do so. No discharge planning needs noted. Anticipate home with previous services when medically ready later today.  Electronically signed by Renato Battles, RN on 06/15/2020 at 10:23 AM

## 2020-06-15 NOTE — Progress Notes (Signed)
Discharge instructions explained. Patient has good understanding.

## 2020-06-16 LAB — CULTURE, RESPIRATORY: Respiratory Culture: NORMAL

## 2020-06-17 LAB — RESPIRATORY VIRUS PCR PANEL
Adenovirus by PCR: NOT DETECTED NA
Adenovirus by PCR: NOT DETECTED NA
Human Metapneumovirus PCR: NOT DETECTED NA
Human Metapneumovirus PCR: NOT DETECTED NA
Influenza A by PCR: NOT DETECTED NA
Influenza A by PCR: NOT DETECTED NA
Influenza B by PCR: NOT DETECTED NA
Influenza B by PCR: NOT DETECTED NA
Parainfluenza 1 PCR: NOT DETECTED NA
Parainfluenza 1 PCR: NOT DETECTED NA
Parainfluenza 2 PCR: NOT DETECTED NA
Parainfluenza 2 PCR: NOT DETECTED NA
Parainfluenza 3 PCR: NOT DETECTED NA
Parainfluenza 3 PCR: NOT DETECTED NA
Parainfluenza 4 PCR: NOT DETECTED NA
Parainfluenza 4 PCR: NOT DETECTED NA
RSV by PCR: NOT DETECTED NA
RSV by PCR: NOT DETECTED NA
Rhinovirus Ab by PCR: NOT DETECTED NA
Rhinovirus Ab by PCR: NOT DETECTED NA

## 2020-06-18 LAB — MYCOPLASMA PNEUMONIAE PCR
Mycoplasma pneumo by PCR: NOT DETECTED NA
Mycoplasma pneumo by PCR: NOT DETECTED NA

## 2020-06-19 LAB — CHLAMYDIA PNEUMONIAE BY PCR-A
Chlamydia pneumoniae By PCR: NOT DETECTED NA
Chlamydia pneumoniae By PCR: NOT DETECTED NA

## 2020-06-19 LAB — HERPES SIMPLEX VIRUS BY PCR (MISC)
HSV, PCR: NOT DETECTED NA
HSV, PCR: NOT DETECTED NA
HSV, PCR: NOT DETECTED NA

## 2020-06-20 LAB — CYTOMEGALOVIRUS (CMV) BY PCR
CMV DNA, Qual PCR: NOT DETECTED NA
CMV DNA, Qual PCR: NOT DETECTED NA
CMV DNA, Qual PCR: NOT DETECTED NA

## 2020-06-20 MED ORDER — TAMOXIFEN CITRATE 20 MG PO TABS
20 | ORAL_TABLET | ORAL | 3 refills | Status: DC
Start: 2020-06-20 — End: 2020-12-15

## 2020-06-25 LAB — SURGICAL PATHOLOGY

## 2020-06-26 LAB — CULTURE, LEGIONELLA
Legionella Species Culture: NEGATIVE
Legionella Species Culture: NEGATIVE

## 2020-07-05 ENCOUNTER — Encounter: Attending: Internal Medicine | Primary: Family Medicine

## 2020-07-05 LAB — CULTURE, FUNGUS

## 2020-07-05 NOTE — Progress Notes (Deleted)
Called Dawn Padilla regarding the telemedicine visit scheduled for 07/05/2020  at 11:50 AM .     Did not answer the phone.  Left a detailed VM.   We'll reschedule the appointment.

## 2020-07-07 ENCOUNTER — Inpatient Hospital Stay: Admit: 2020-07-07 | Attending: Internal Medicine | Primary: Family Medicine

## 2020-07-07 DIAGNOSIS — J410 Simple chronic bronchitis: Secondary | ICD-10-CM

## 2020-07-07 LAB — RENAL FUNCTION PANEL
Albumin,Serum: 4 g/dL (ref 3.5–5.0)
Anion Gap: 10 mmol/L (ref 3–13)
BUN: 13 mg/dL (ref 9–20)
CO2: 19 mmol/L — ABNORMAL LOW (ref 22–30)
Calcium: 9 mg/dL (ref 8.4–10.4)
Chloride: 112 mmol/L — ABNORMAL HIGH (ref 98–107)
Creatinine: 0.96 mg/dL (ref 0.52–1.25)
EGFR IF NonAfrican American: 59.2 mL/min — AB (ref >60)
Glucose: 80 mg/dL (ref 70–100)
Phosphorus: 3.3 mg/dL (ref 2.5–4.5)
Potassium: 3.1 mmol/L — ABNORMAL LOW (ref 3.5–5.1)
Sodium: 141 mmol/L (ref 135–145)
eGFR African American: 68.6 mL/min (ref >60)

## 2020-07-07 LAB — URINALYSIS
Bilirubin Urine: NEGATIVE mg/dL
Glucose, Ur: NORMAL mg/dL (ref <70)
Hyaline Casts, UA: NEGATIVE /LPF
Ketones, Urine: NEGATIVE mg/dL
LEUKOCYTES, UA: 75 Leu/uL — AB
Nitrite, Urine: POSITIVE NA — AB
Occult Blood,Urine: 0.03 mg/dL — AB
Specific Gravity, Urine: 1.02 NA (ref 1.005–1.030)
Total Protein, Urine: NEGATIVE mg/dL
Urobilinogen, Urine: NORMAL mg/dL (ref 0–1)
pH, Urine: 5.5 NA (ref 5.0–8.0)

## 2020-07-07 LAB — PTH, INTACT: Pth Intact: 49.1 pg/mL (ref 8.0–54.0)

## 2020-07-07 LAB — PROTEIN, URINE, RANDOM: Protein, Urine, Random: 5 mg/dL

## 2020-07-07 LAB — CREATININE, RANDOM URINE: Creatinine, Random Urine: 152.4 mg/dL

## 2020-07-07 LAB — VITAMIN D 25 HYDROXY: Vit D, 25-Hydroxy: 91 ng/mL (ref 30–100)

## 2020-07-07 MED ORDER — ALBUTEROL SULFATE (2.5 MG/3ML) 0.083% IN NEBU
Freq: Once | RESPIRATORY_TRACT | Status: AC
Start: 2020-07-07 — End: 2020-07-07
  Administered 2020-07-07: 13:00:00 via RESPIRATORY_TRACT

## 2020-07-07 NOTE — Other (Unsigned)
Patient Acct Nbr: [id number]   Primary AUTH/CERT:   [place] Name: [place] name: [facility] Dual Complete  Primary Insurance Group Number: [employer]  Primary Insurance Plan Type: Health  Primary Insurance Policy Number: [id number]

## 2020-07-07 NOTE — Other (Unsigned)
Patient Acct Nbr: [id number]   Primary AUTH/CERT:   [place] Name: [place] name: [facility] Dual Complete  Primary Insurance Group Number: [employer]  Primary Insurance Plan Type: Health  Primary Insurance Policy Number: [id number]    Secondary AUTH/CERT:   [place] Name: [employer] Plan name: [facility] [employer]CrMcaid Second  Secondary Insurance Group Number: [employer]  Secondary Insurance Plan Type: Barrister's clerk Number: [id number]

## 2020-07-21 ENCOUNTER — Inpatient Hospital Stay: Admit: 2020-07-21 | Attending: Family Medicine | Primary: Family Medicine

## 2020-07-21 NOTE — Other (Unsigned)
Patient Acct Nbr: [id number]   Primary AUTH/CERT:   [place] Name: [place] name: [facility] Dual Complete  Primary Insurance Group Number: [employer]  Primary Insurance Plan Type: Health  Primary Insurance Policy Number: [id number]    Secondary AUTH/CERT:   [place] Name: [employer] Plan name: [facility] MyCrMcaid Second  Secondary Insurance Group Number: [employer]  Secondary Insurance Plan Type: Barrister's clerk Number: [id number]

## 2020-07-27 LAB — CULTURE WITH SMEAR, ACID FAST BACILLIUS
Acid Fast Smear: NONE SEEN
Acid Fast Smear: NONE SEEN
Acid Fast Smear: NONE SEEN

## 2020-09-01 ENCOUNTER — Encounter: Admit: 2020-09-01 | Discharge: 2020-09-01 | Payer: MEDICARE | Attending: Internal Medicine | Primary: Family Medicine

## 2020-09-01 DIAGNOSIS — R918 Other nonspecific abnormal finding of lung field: Secondary | ICD-10-CM
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
## 2020-09-01 NOTE — Patient Instructions (Signed)
YOUR APPOINTMENT TODAY WAS WITH THE [facility] LUNG NODULE CLINIC, COPD CLINIC, PULMONARY AND SLEEP MEDICINE OFFICE.    PLEASE CALL OUR OFFICE AT [phone] IF YOU HAVE NOT RECEIVED YOUR TEST RESULTS 7 DAYS AFTER TESTING IS COMPLETED.    PLEASE REMEMBER TO REQUEST REFILLS AT YOUR OFFICE VISITS.  PHONE/FAX REQUESTS REQUIRE 48-72 HOURS FOR RESPONSE.    AS A FRIENDLY REMINDER COPAYS ARE DUE AT TIME OF SERVICE. THANK YOU.     Our Patients Are Important! We want to improve and you can help.    After your visit we want you to feel:   ??? Listened to,   ??? Respected and have your health care explained.     You may receive a survey asking you about your visit. Please complete the survey. We will use your feedback to make improvements.     COVID-19 VACCINATION INFORMATION:    PH.  [phone]  [email or web address].ORG/CORONAVIRUS/VACCINE

## 2020-09-12 ENCOUNTER — Ambulatory Visit: Primary: Family Medicine

## 2020-09-13 NOTE — Telephone Encounter (Signed)
Pt missed PET on 6/13. Pt notified to reschedule testing prior to her f/u.

## 2020-09-19 NOTE — Telephone Encounter (Signed)
Unable to leave msg, v/m box is full. F/u needs to be after PET.

## 2020-09-21 ENCOUNTER — Encounter: Attending: Nurse Practitioner | Primary: Family Medicine

## 2020-09-30 ENCOUNTER — Inpatient Hospital Stay: Admit: 2020-09-30 | Attending: Internal Medicine | Primary: Family Medicine

## 2020-09-30 DIAGNOSIS — R918 Other nonspecific abnormal finding of lung field: Secondary | ICD-10-CM

## 2020-09-30 NOTE — Other (Unsigned)
Patient Acct Nbr: [id number]   Primary AUTH/CERT: [id number]  [place] Name: [place] name: [facility] Dual Complete  Primary Insurance Group Number: [employer]  Primary Insurance Plan Type: Health  Primary Insurance Policy Number: [id number]    Secondary AUTH/CERT:   [place] Name: [employer] Plan name: [facility] MyCrMcaid Second  Secondary Insurance Group Number: [employer]  Secondary Insurance Plan Type: Health  Secondary Insurance Policy Number: [id number]

## 2020-10-04 ENCOUNTER — Encounter: Attending: Nurse Practitioner | Primary: Family Medicine

## 2020-10-04 NOTE — Telephone Encounter (Signed)
Patient no showed to appointment to discuss PET results.  Attempted to contact patient via telephone to discuss results and left VM on primary contact's phone.  Please reschedule patient to review PET scan results and appropriate f/u.
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
## 2020-10-05 NOTE — Telephone Encounter (Signed)
Can be scheduled @[place]  office. VV audio. Unable to leave msg.

## 2020-10-07 NOTE — Telephone Encounter (Signed)
Navigator also attempted to contact patient, but no vm set up.  I spoke with her emergency contact/neighbor, Sheliah Hatch, and provided my direct number.  She will go across the street to be sure patient is ok and will have patient call me directly.  Will wait for pt to call back or will plan to send a certified letter.

## 2020-10-07 NOTE — Telephone Encounter (Signed)
Dawn Padilla returned my call.  She was grateful that we contacted her emergency contact and provided a direct line to navigation office.  She has been attempting to call the lung nodule clinic appointment for several days.  I arranged a live appointment with Dr. Dorris Singh on 10/11/2020 to review PET results and plans for follow-up.  She has my direct number and will call if she has additional questions.

## 2020-10-07 NOTE — Telephone Encounter (Signed)
Letter mailed.

## 2020-10-11 ENCOUNTER — Ambulatory Visit: Admit: 2020-10-11 | Discharge: 2020-10-11 | Payer: MEDICARE | Attending: Internal Medicine | Primary: Family Medicine

## 2020-10-11 DIAGNOSIS — M899 Disorder of bone, unspecified: Secondary | ICD-10-CM

## 2020-10-11 NOTE — Patient Instructions (Signed)
YOUR APPOINTMENT TODAY WAS WITH THE [facility] LUNG NODULE CLINIC, COPD CLINIC, PULMONARY AND SLEEP MEDICINE OFFICE.    PLEASE CALL OUR OFFICE AT [phone] IF YOU HAVE NOT RECEIVED YOUR TEST RESULTS 7 DAYS AFTER TESTING IS COMPLETED.    PLEASE REMEMBER TO REQUEST REFILLS AT YOUR OFFICE VISITS.  PHONE/FAX REQUESTS REQUIRE 48-72 HOURS FOR RESPONSE.    AS A FRIENDLY REMINDER COPAYS ARE DUE AT TIME OF SERVICE. THANK YOU.     Our Patients Are Important! We want to improve and you can help.    After your visit we want you to feel:   ??? Listened to,   ??? Respected and have your health care explained.     You may receive a survey asking you about your visit. Please complete the survey. We will use your feedback to make improvements.     COVID-19 VACCINATION INFORMATION:    PH.  [phone]  [email or web address].ORG/CORONAVIRUS/VACCINE
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
## 2020-10-12 NOTE — Telephone Encounter (Signed)
RN reached out to patient to discuss scheduling for CT-guided bone biopsy ordered by Dr. Dorris Singh 10/11/2020.  Patient states that she is attempting to arrange transportation through a neighbor and is waiting to hear back from a few other people regarding transportation as well.  She is aware I will reach out to the department to obtain upcoming dates to see if we can find something that matches availability.  Provided my direct contact to patient, she does not have any questions at this time.

## 2020-10-12 NOTE — Telephone Encounter (Signed)
Lm for specials coordinator regarding upcoming dates.

## 2020-10-14 NOTE — Telephone Encounter (Signed)
I attempted to reach patient at number listed in the chart.  Phone continues to rang and I am unable to leave a voice message.  Specials coordinator contact me and patient could be scheduled July 22 at 8 AM with a 6 AM arrival to the main entrance of [facility].  Patient would need transportation as previously discussed and I was attempting to reach the patient to discuss this date and see if she had been able to coordinate anything.  I will continue to attempt to reach out to patient.

## 2020-10-17 NOTE — Telephone Encounter (Signed)
I spoke to Dawn Padilla by phone, she reports a new R side chest pain that radiates to her underarm that started over the weekend. She states this is intermittent but when it does happen it is 10/10. Mild conversational dyspnea during our conversation. She denies any further issues. 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 I had originally called patient to discuss scheduling but given new symptoms I advised patient to go to ED. She states if she does go to ED it will be after 3P once she can pick up her granddaughter from work. I advised her I will send a message to IP provider as well so that maybe something can be arranged IP if patient comes to be evaluated. She verbalized understanding of situation and is aware that the current recommendation is to proceed to ED.     No questions at this time patient has my direct number to contact me with any questions or concerns.

## 2020-10-17 NOTE — Telephone Encounter (Signed)
See new TE 10/17/20; pt was advised to proceed to ED.

## 2020-10-18 NOTE — Telephone Encounter (Signed)
RN attempted to reach patient to see how she was feeling since she did not proceed to ED 10/17/2020 after conversation.  Also called patient to discuss scheduling CT bone biopsy and if she has had any success with arranging transportation.  Patient phone number does not have a voicemail box and continues to ring.  RN will attempt to reach patient again.

## 2020-10-19 NOTE — Telephone Encounter (Signed)
PT scheduled 11/03/20 @ 10am     Arrive at 8am to [address]     Upon review of patient chart she does not take any anticoagulants that would need held for this procedure will call patient tomorrow morning and notify her of all prep as we spoke about today.
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
## 2020-10-28 NOTE — Telephone Encounter (Signed)
Called patient to review that she has transportation arranged for 11/03/2020 as it is 1 week away.  RN left detailed message for patient to return my call.

## 2020-11-02 NOTE — Telephone Encounter (Signed)
Spoke with patient.      Reviewed instructions for procedure.  Aware of arrival time at 0800 to [facility] on 11/03/20, nothing to eat after 0000, and may drink water or black coffee up until 2 hours prior to the procedure.       Will need a responsible person to drive you home after the procedure is completed.  May not use public transportation unless accompanied by a responsible adult.    Questions answered. Verbalized understanding.    Directions given for Same Day Surgery.  Follow signs around the hospital to "Main Entrance" which is located at [address].  Turn onto "[address]" from [facility].  You may use Valet Parking.  Each patient to receive one validation ticket for [employer].

## 2020-11-03 ENCOUNTER — Inpatient Hospital Stay: Attending: Internal Medicine | Primary: Family Medicine

## 2020-11-03 ENCOUNTER — Encounter: Primary: Family Medicine
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
## 2020-11-10 ENCOUNTER — Encounter: Payer: MEDICARE | Attending: Internal Medicine | Primary: Family Medicine
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
## 2020-11-17 NOTE — Telephone Encounter (Signed)
Dawn Padilla returned my call from specials and states that they are unable to approve patient coming by cab without a responsible adult present.  They are not able to direct admit a patient after these biopsies however ordering physician or other service could plan for that.  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 Juanita Padilla for consideration since patient has delayed or missed biopsy for multiple weeks with no clear resolution in sight.  She is aware of the urgency but has many other barriers to her care at this time.  Please advise.    PT refusing [facility] transport through insurance.

## 2020-11-17 NOTE — Telephone Encounter (Signed)
Spoke to [place] at length today regarding follow-up and rescheduling CT-guided bone biopsy.  Patient states that she does not have any options for transportation and has not been able to replace her own vehicle or contact anyone who would be able to give her a ride.  Patient cares for her granddaughter who is disabled and does not have many resources.  She is aware of availability of [facility] transportation but refuses.  She is inquiring whether she can take a cab to and from the appointment but would not have a designated adult with her. She is aware this is typically not allowed. She is aware I will reach out to the specials department and see if any sort of arrangements can be made.     LM in specials dept regarding pt and situation.

## 2020-11-22 NOTE — Telephone Encounter (Signed)
RN was not able to reach patient at this time. There is no VM set up and RN was prompted to enter remote access ID. Will attempt to reach patient again.

## 2020-11-24 NOTE — Telephone Encounter (Signed)
RN navigator was transferred patient call from central scheduling, patient states she is still unable to find someone for transportation to procedure and would prefer to be admitted for observation.  She is aware that under observation she would most likely be required to stay for 12 to 24 hours after her procedure.  She is aware that I will get this scheduled and attempt to find admitting physician and call her back tomorrow morning after 9 AM with all details.  She is aware that if she is admitted to observation that she could use a ride service as needed.  She does not have any questions for me at this time.

## 2020-11-24 NOTE — Telephone Encounter (Signed)
Patient scheduled for CT bone biopsy Thursday, December 01, 2020 at 10 AM, central scheduling is aware that the patient is to be bedded after procedure will arrange observation stay and contact patient regarding all prep.

## 2020-11-29 ENCOUNTER — Encounter: Admit: 2020-11-29 | Discharge: 2020-11-29 | Payer: MEDICARE | Attending: Women's Health | Primary: Family Medicine

## 2020-11-29 ENCOUNTER — Encounter

## 2020-11-29 DIAGNOSIS — Z853 Personal history of malignant neoplasm of breast: Secondary | ICD-10-CM

## 2020-11-29 MED ORDER — BREAST PROSTHESIS
2 refills | Status: AC
Start: 2020-11-29 — End: ?

## 2020-11-29 MED ORDER — MASTECTOMY BRA
0 refills | Status: AC
Start: 2020-11-29 — End: ?
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
## 2020-11-29 NOTE — Telephone Encounter (Signed)
RN contacted patient and reviewed appointment for Thursday, December 01, 2020 in the specials department for CT bone biopsy.  Patient is not on any anticoagulants and does not take any diabetic medications.  Reviewed prep and arrival time of 8 AM.  Patient is aware the plan is to admit for observation postprocedure as she does not have transportation arranged.  She does not have questions for me regarding this procedure.  I will contact specials department to remind them that patient is to be bedded after procedure.  Forwarding to Dr. Juanita Craver with [facility] hospitalist on-call today, 11/29/2020 in case he would like to speak to them ahead of admission on Thursday to make them aware, otherwise admitting on call physician will not be available until Thurs. Thanks.

## 2020-11-30 NOTE — Telephone Encounter (Signed)
Spoke with patient.      Reviewed instructions for procedure.  Aware of arrival time at 8:00 am to [facility] on 12-01-20, nothing to eat after midnight, ok to take blood pressure medication, and may drink water or black coffee up until 2 hours prior to the procedure.       Will need a responsible person to drive you home after the procedure is completed.  May not use public transportation unless accompanied by a responsible adult.    Questions answered. Verbalized understanding.    Directions given for Same Day Surgery.  Follow signs around the hospital to "Main Entrance" which is located at [address].  Turn onto "[address]" from [facility].  You may use Valet Parking.  Each patient to receive one validation ticket for [employer].

## 2020-12-01 ENCOUNTER — Encounter: Admit: 2020-12-01 | Primary: Family Medicine

## 2020-12-01 ENCOUNTER — Inpatient Hospital Stay: Payer: MEDICARE

## 2020-12-01 LAB — CBC
Hematocrit: 42.8 % (ref 35.0–47.0)
Hemoglobin: 14.1 g/dL (ref 11.7–16.0)
MCH: 32 pg (ref 26.0–34.0)
MCHC: 32.9 % (ref 32.0–36.0)
MCV: 97.2 fL (ref 79.0–98.0)
MPV: 7.2 fL — ABNORMAL LOW (ref 7.4–12.4)
Platelets: 278 10*3/uL (ref 140–440)
RBC: 4.4 10*6/uL (ref 3.80–5.20)
RDW: 16 % — ABNORMAL HIGH (ref 11.5–14.5)
WBC: 10.7 10*3/uL (ref 3.6–10.7)

## 2020-12-01 LAB — PROTIME-INR
INR: 1 NA (ref 0.9–1.1)
Protime: 10.8 s (ref 9.0–12.0)

## 2020-12-01 MED ORDER — SODIUM CHLORIDE 0.9 % IV SOLN
0.9 % | INTRAVENOUS | Status: DC | PRN
Start: 2020-12-01 — End: 2020-12-02

## 2020-12-01 MED ORDER — LIDOCAINE 4 % EX PTCH
4 % | Freq: Every day | CUTANEOUS | Status: DC
Start: 2020-12-01 — End: 2020-12-02
  Administered 2020-12-02: 1 via TRANSDERMAL

## 2020-12-01 MED ORDER — TAMOXIFEN CITRATE 10 MG PO TABS
10 MG | Freq: Every day | ORAL | Status: DC
Start: 2020-12-01 — End: 2020-12-02
  Administered 2020-12-02: 13:00:00 20 mg via ORAL

## 2020-12-01 MED ORDER — ATORVASTATIN CALCIUM 20 MG PO TABS
20 MG | Freq: Every evening | ORAL | Status: DC
Start: 2020-12-01 — End: 2020-12-02
  Administered 2020-12-02: 20 mg via ORAL

## 2020-12-01 MED ORDER — METOPROLOL TARTRATE 50 MG PO TABS
50 MG | Freq: Every evening | ORAL | Status: DC
Start: 2020-12-01 — End: 2020-12-02
  Administered 2020-12-02: 50 mg via ORAL

## 2020-12-01 MED ORDER — NORMAL SALINE FLUSH 0.9 % IV SOLN
0.9 % | INTRAVENOUS | Status: DC | PRN
Start: 2020-12-01 — End: 2020-12-02

## 2020-12-01 MED ORDER — FENTANYL CITRATE (PF) 100 MCG/2ML IJ SOLN
100 MCG/2ML | Freq: Once | INTRAMUSCULAR | Status: AC | PRN
Start: 2020-12-01 — End: 2020-12-01
  Administered 2020-12-01: 18:00:00 50 via INTRAVENOUS

## 2020-12-01 MED ORDER — FENTANYL CITRATE (PF) 100 MCG/2ML IJ SOLN
100 MCG/2ML | INTRAMUSCULAR | Status: DC
Start: 2020-12-01 — End: 2020-12-01

## 2020-12-01 MED ORDER — LISINOPRIL 20 MG PO TABS
20 MG | Freq: Every day | ORAL | Status: DC
Start: 2020-12-01 — End: 2020-12-02
  Administered 2020-12-02: 13:00:00 40 mg via ORAL

## 2020-12-01 MED ORDER — CALCIUM CARB-CHOLECALCIFEROL 500-200 MG-UNIT PO TABS
500-200 MG-UNIT | Freq: Every day | ORAL | Status: DC
Start: 2020-12-01 — End: 2020-12-02
  Administered 2020-12-02: 13:00:00 1 via ORAL

## 2020-12-01 MED ORDER — FENTANYL CITRATE (PF) 100 MCG/2ML IJ SOLN
100 MCG/2ML | Freq: Once | INTRAMUSCULAR | Status: AC | PRN
Start: 2020-12-01 — End: 2020-12-01
  Administered 2020-12-01: 18:00:00 25 via INTRAVENOUS

## 2020-12-01 MED ORDER — ONDANSETRON HCL 4 MG/2ML IJ SOLN
4 MG/2ML | Freq: Four times a day (QID) | INTRAMUSCULAR | Status: DC | PRN
Start: 2020-12-01 — End: 2020-12-02

## 2020-12-01 MED ORDER — ACETAMINOPHEN 500 MG PO TABS
500 MG | Freq: Four times a day (QID) | ORAL | Status: DC | PRN
Start: 2020-12-01 — End: 2020-12-02
  Administered 2020-12-02 (×2): 1000 mg via ORAL

## 2020-12-01 MED ORDER — DICLOFENAC SODIUM 1 % EX GEL
1 % | Freq: Two times a day (BID) | CUTANEOUS | Status: DC
Start: 2020-12-01 — End: 2020-12-02
  Administered 2020-12-02 (×2): 2 g via TOPICAL

## 2020-12-01 MED ORDER — LIDOCAINE HCL (PF) 1 % IJ SOLN
1 % | Freq: Once | INTRAMUSCULAR | Status: AC | PRN
Start: 2020-12-01 — End: 2020-12-01
  Administered 2020-12-01: 18:00:00 10 via INTRADERMAL

## 2020-12-01 MED ORDER — BENZTROPINE MESYLATE 0.5 MG PO TABS
0.5 MG | Freq: Every evening | ORAL | Status: DC
Start: 2020-12-01 — End: 2020-12-02
  Administered 2020-12-02: 0.5 mg via ORAL

## 2020-12-01 MED ORDER — DICYCLOMINE HCL 20 MG PO TABS
20 MG | Freq: Four times a day (QID) | ORAL | Status: DC | PRN
Start: 2020-12-01 — End: 2020-12-02

## 2020-12-01 MED ORDER — GABAPENTIN 100 MG PO CAPS
100 MG | Freq: Every evening | ORAL | Status: DC
Start: 2020-12-01 — End: 2020-12-02
  Administered 2020-12-02: 100 mg via ORAL

## 2020-12-01 MED ORDER — NORMAL SALINE FLUSH 0.9 % IV SOLN
0.9 % | Freq: Two times a day (BID) | INTRAVENOUS | Status: DC
Start: 2020-12-01 — End: 2020-12-02
  Administered 2020-12-02: 10 mL via INTRAVENOUS

## 2020-12-01 MED ORDER — MIDAZOLAM HCL 2 MG/2ML IJ SOLN
2 MG/ML | Freq: Once | INTRAMUSCULAR | Status: AC | PRN
Start: 2020-12-01 — End: 2020-12-01
  Administered 2020-12-01: 18:00:00 1 via INTRAVENOUS

## 2020-12-01 MED ORDER — METOPROLOL TARTRATE 100 MG PO TABS
100 MG | Freq: Every day | ORAL | Status: DC
Start: 2020-12-01 — End: 2020-12-02

## 2020-12-01 MED ORDER — MIDAZOLAM HCL 2 MG/2ML IJ SOLN
2 MG/ML | Freq: Once | INTRAMUSCULAR | Status: AC | PRN
Start: 2020-12-01 — End: 2020-12-01
  Administered 2020-12-01: 18:00:00 .5 via INTRAVENOUS

## 2020-12-01 MED ORDER — MIDAZOLAM HCL 2 MG/2ML IJ SOLN
2 MG/ML | INTRAMUSCULAR | Status: DC
Start: 2020-12-01 — End: 2020-12-01

## 2020-12-01 MED ORDER — AMLODIPINE BESYLATE 5 MG PO TABS
5 MG | Freq: Every day | ORAL | Status: DC
Start: 2020-12-01 — End: 2020-12-02
  Administered 2020-12-02: 13:00:00 5 mg via ORAL

## 2020-12-01 MED ORDER — SODIUM CHLORIDE 0.9 % IV SOLN
0.9 % | INTRAVENOUS | Status: DC
Start: 2020-12-01 — End: 2020-12-01
  Administered 2020-12-01: 13:00:00 via INTRAVENOUS

## 2020-12-01 MED ORDER — PREDNISONE 10 MG PO TABS
10 MG | Freq: Every day | ORAL | Status: DC
Start: 2020-12-01 — End: 2020-12-02
  Administered 2020-12-02: 13:00:00 10 mg via ORAL

## 2020-12-01 MED ORDER — ONDANSETRON 4 MG PO TBDP
4 MG | Freq: Three times a day (TID) | ORAL | Status: DC | PRN
Start: 2020-12-01 — End: 2020-12-02

## 2020-12-01 MED ORDER — CYCLOBENZAPRINE HCL 10 MG PO TABS
10 MG | Freq: Three times a day (TID) | ORAL | Status: DC | PRN
Start: 2020-12-01 — End: 2020-12-02

## 2020-12-01 MED ORDER — POLYETHYLENE GLYCOL 3350 17 G PO PACK
17 g | Freq: Every day | ORAL | Status: DC | PRN
Start: 2020-12-01 — End: 2020-12-02

## 2020-12-01 MED ORDER — ACETAMINOPHEN 500 MG PO TABS
500 MG | Freq: Once | ORAL | Status: AC
Start: 2020-12-01 — End: 2020-12-01
  Administered 2020-12-01: 20:00:00 1000 mg via ORAL

## 2020-12-01 MED ORDER — FLUTICASONE PROPIONATE 50 MCG/ACT NA SUSP
50 MCG/ACT | Freq: Every day | NASAL | Status: DC
Start: 2020-12-01 — End: 2020-12-02

## 2020-12-01 MED FILL — FENTANYL CITRATE (PF) 100 MCG/2ML IJ SOLN: 100 MCG/2ML | INTRAMUSCULAR | Qty: 2

## 2020-12-01 MED FILL — BENZTROPINE MESYLATE 0.5 MG PO TABS: 0.5 mg | ORAL | Qty: 1

## 2020-12-01 MED FILL — CYCLOBENZAPRINE HCL 10 MG PO TABS: 10 mg | ORAL | Qty: 1

## 2020-12-01 MED FILL — FLUTICASONE PROPIONATE 50 MCG/ACT NA SUSP: 50 MCG/ACT | NASAL | Qty: 16

## 2020-12-01 MED FILL — DICYCLOMINE HCL 20 MG PO TABS: 20 mg | ORAL | Qty: 1

## 2020-12-01 MED FILL — MAPAP 500 MG PO TABS: 500 mg | ORAL | Qty: 2

## 2020-12-01 MED FILL — GABAPENTIN 100 MG PO CAPS: 100 mg | ORAL | Qty: 1

## 2020-12-01 MED FILL — LIDOCAINE PAIN RELIEF 4 % EX PTCH: 4 % | CUTANEOUS | Qty: 1

## 2020-12-01 MED FILL — PREDNISONE 10 MG PO TABS: 10 mg | ORAL | Qty: 1

## 2020-12-01 MED FILL — DICLOFENAC SODIUM 1 % EX GEL: 1 % | CUTANEOUS | Qty: 100

## 2020-12-01 MED FILL — METOPROLOL TARTRATE 100 MG PO TABS: 100 mg | ORAL | Qty: 1

## 2020-12-01 MED FILL — TAMOXIFEN CITRATE 10 MG PO TABS: 10 mg | ORAL | Qty: 2

## 2020-12-01 MED FILL — MIDAZOLAM HCL 2 MG/2ML IJ SOLN: 2 mg/mL | INTRAMUSCULAR | Qty: 2

## 2020-12-01 MED FILL — ATORVASTATIN CALCIUM 20 MG PO TABS: 20 mg | ORAL | Qty: 1
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
## 2020-12-01 NOTE — Other (Signed)
Patient arrived to CT department from sds for Bone biopsy.  Dr. Dessie Coma in to discuss procedure with patient and informed consent obtained.  Patient assisted to CT table and place prone. Cardiac monitor on.  Core needle biopsies obtained. Bandaid applied to site.  Patient tolerated procedure well. Report called and patient transferred to sds.

## 2020-12-01 NOTE — Brief Op Note (Addendum)
Brief Postoperative Note    Dawn Padilla  Date of Birth:  12-Sep-1950  [id number]    Pre-operative Diagnosis: L1 hypermetabolic lesion    Post-operative Diagnosis: Same    Procedure: L1 spinal biopsy    Anesthesia: Moderate Sedation    Surgeons/Assistants: Gean Maidens, MD    Estimated Blood Loss: < 2 mL    Complications: None    Specimens: Biopsy specimens obtained as detailed in full report    Findings: Full report to follow in PACS/Epic imaging tab.    Electronically signed by Gean Maidens, MD on 12/01/2020 at 2:16 PM    Gean Maidens, MD  Interventional Radiology
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
## 2020-12-02 MED FILL — AMLODIPINE BESYLATE 5 MG PO TABS: 5 mg | ORAL | Qty: 1

## 2020-12-02 MED FILL — OYSTER SHELL CALCIUM/D 500-5 MG-MCG PO TABS: 500-5 | ORAL | Qty: 1

## 2020-12-02 MED FILL — LIDOCAINE PAIN RELIEF 4 % EX PTCH: 4 % | CUTANEOUS | Qty: 1

## 2020-12-02 MED FILL — MAPAP 500 MG PO TABS: 500 mg | ORAL | Qty: 2

## 2020-12-02 MED FILL — LISINOPRIL 20 MG PO TABS: 20 mg | ORAL | Qty: 2

## 2020-12-02 MED FILL — METOPROLOL TARTRATE 100 MG PO TABS: 100 mg | ORAL | Qty: 1

## 2020-12-02 NOTE — Other (Unsigned)
Patient Acct Nbr: [id number]   Primary AUTH/CERT:   [place] Name: [place] name: [facility] Dual Complete  Primary Insurance Group Number: [id number]  Primary Insurance Plan Type: Health  Primary Insurance Policy Number: [id number]    Secondary AUTH/CERT:   [place] Name: [employer] Plan name: [facility] MyCrMcaid Second  Secondary Insurance Group Number: [employer]  Secondary Insurance Plan Type: Barrister's clerk Number: [id number]
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
## 2020-12-06 NOTE — Telephone Encounter (Signed)
Outside pcp. Faxed discharge summary to patients pcp Dr. Cherlynn Polo. Staff to contact patient to arrange a follow up if needed.  No f/u needed per hospitalist at this time.

## 2020-12-08 ENCOUNTER — Ambulatory Visit: Admit: 2020-12-08 | Discharge: 2020-12-08 | Payer: MEDICARE | Attending: Internal Medicine | Primary: Family Medicine

## 2020-12-08 DIAGNOSIS — C50919 Malignant neoplasm of unspecified site of unspecified female breast: Secondary | ICD-10-CM

## 2020-12-08 LAB — SURGICAL PATHOLOGY
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
## 2020-12-08 NOTE — Patient Instructions (Signed)
YOUR APPOINTMENT TODAY WAS WITH THE [facility] LUNG NODULE CLINIC, COPD CLINIC, PULMONARY AND SLEEP MEDICINE OFFICE.    PLEASE CALL OUR OFFICE AT [phone] IF YOU HAVE NOT RECEIVED YOUR TEST RESULTS 7 DAYS AFTER TESTING IS COMPLETED.    PLEASE REMEMBER TO REQUEST REFILLS AT YOUR OFFICE VISITS.  PHONE/FAX REQUESTS REQUIRE 48-72 HOURS FOR RESPONSE.    AS A FRIENDLY REMINDER COPAYS ARE DUE AT TIME OF SERVICE. THANK YOU.     Our Patients Are Important! We want to improve and you can help.    After your visit we want you to feel:   Listened to,   Respected and have your health care explained.     You may receive a survey asking you about your visit. Please complete the survey. We will use your feedback to make improvements.     COVID-19 VACCINATION INFORMATION:    PH.  [phone]  [email or web address]

## 2020-12-08 NOTE — Telephone Encounter (Signed)
Received a call back from [facility], breast navigator.  She agrees to work on arranging follow-up for Dawn Padilla's metastatic breast cancer diagnosis.  She is aware of patient's barriers to care and has also contacted social work for assistance.

## 2020-12-09 NOTE — Progress Notes (Signed)
Follow-up with Medical Oncology regarding moving pts appt up. Spoke with [employer]  Dr. Lyda Kalata is aware and did receive message regarding pt. They are waiting for additional testing results prior to moving up appt.  Claiborne Billings will contact pt to get her re-scheduled/moved up with Dr. Lyda Kalata and we will mostly likely cancel appt with Dr. Pershing Proud since she just saw Heidi (unless patient still wants to see Dr. Pershing Proud for follow-up).
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
## 2020-12-09 NOTE — Progress Notes (Signed)
Attempted to reach out to patient at request of RN Navigator regarding transportation.Phone rang repeatedly then asked for a remote access code.Unable to leave a message.I will continue trying to reach her.

## 2020-12-14 ENCOUNTER — Encounter: Payer: MEDICARE | Attending: Surgery | Primary: Family Medicine

## 2020-12-15 ENCOUNTER — Ambulatory Visit
Admit: 2020-12-15 | Discharge: 2020-12-15 | Payer: MEDICARE | Attending: Hematology & Oncology | Primary: Family Medicine

## 2020-12-15 DIAGNOSIS — C50112 Malignant neoplasm of central portion of left female breast: Secondary | ICD-10-CM

## 2020-12-15 MED ORDER — LETROZOLE 2.5 MG PO TABS
2.5 MG | ORAL_TABLET | Freq: Every day | ORAL | 3 refills | Status: AC
Start: 2020-12-15 — End: ?

## 2020-12-15 NOTE — Progress Notes (Signed)
Per my phone discussion with patient I prepared an envelope for her that Dr.Mahesh's office staff will give her when she arrives for her appointment today.It included 2 gift cards from [facility] and the breast cancer grant applications.She can contact me if she needs further assistance.

## 2020-12-15 NOTE — Progress Notes (Signed)
HPI  Dawn Padilla is a 70 y.o.  female who comes in for a follow-up.  Since her last visit, she has been diagnosed with metastatic disease.  Her last visit, she was getting evaluated for lung abnormality by Dr. Dagoberto Ligas.  PET scan was ordered that revealed findings consistent with bone metastasis.  She underwent biopsy of a spinal lesion that revealed malignancy consistent with metastatic breast cancer.  She is here to discuss next steps.    She does not have any back pain or for that matter any pain.     she feels well.  Apettite  and energy levels are good.  No hot flashes.  Hair thinning has resolved.    she has the following oncology history  1.  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  2.  Currently on tamoxifen since 11/16/2013.  3.  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   HENT:  Negative for mouth sores and trouble swallowing.    Eyes:  Negative for photophobia and visual disturbance.   Respiratory:  Negative for cough and shortness of breath.    Cardiovascular:  Negative for chest pain.   Gastrointestinal:  Negative for abdominal pain, diarrhea, nausea and vomiting.   Genitourinary:  Negative for dysuria and hematuria.   Musculoskeletal:  Positive for gait problem. Negative for arthralgias and myalgias.   Skin:  Negative for rash.   Neurological:  Negative for seizures, light-headedness and headaches.   Hematological:  Negative for adenopathy. Does not bruise/bleed easily.   Psychiatric/Behavioral:  Negative for sleep disturbance.        Family History   Problem Relation Age of Onset    Cancer Mother         lung    Cancer Father         stomach    Cancer Sister         breast    Cancer Maternal Grandmother         colon     Social History     Tobacco Use    Smoking status: Never    Smokeless tobacco: Never   Substance Use Topics     Alcohol use: No       Vitals:    12/15/20 0954   BP: (!) 143/68   Pulse: 76   Temp: 97.5 F (36.4 C)   SpO2: 99%     Breast examination reveals well-healed bilateral mastectomy scars with no tumor nodules.    Lab Results   Component Value Date    WBC 10.7 12/01/2020    HGB 14.1 12/01/2020    HCT 42.8 12/01/2020    MCV 97.2 12/01/2020    PLT 278 12/01/2020     Pathology 12/01/2020    Supplemental A:  (12/08/2020)   IMMUNOHISTOCHEMISTRY TESTING FOR MISMATCH REPAIR (MMR) PROTEIN:   Block:  A1   MLH1: Intact nuclear expression   MSH2: Intact nuclear expression   MSH6: Intact nuclear expression   PMS2: Intact nuclear expression     Background non-neoplastic tissue/internal control with intact nuclear   expression     IHC INTERPRETATION:     No loss of nuclear expression of MMR proteins: low probability of   microsatellite instability-high (MSI-H)     COMMENT:  There are exceptions to the above IHC interpretations.  These   results should not be considered in isolation, and clinical correlation   with genetic counseling is recommended to assess the need for germline   testing.  /rw       ER, PR and HER2 IMMUNOHISTOCHEMISTRY:     ESTROGEN RECEPTORS STATUS:  POSITIVE   Percentage of tumor cells with nuclear positivity:  81-90%   Average intensity of staining:  Intermediate     PROGESTERONE RECEPTOR STATUS:  POSITIVE   Percentage of tumor cells with nuclear positivity:  1%   Average intensity of staining:  Intermediate     HER2 NEU IMMUNOHISTOCHEMISTRY:  Negative (Score 0)     COLD ISCHEMIA/FIXATION TIMES:  Do not meet CAP/ASCO requirements     TESTING PERFORMED ON BLOCK:  A1     QA statement:  This case has been reviewed as part of intradepartmental   quality review: [facility], [place]     Estrogen/Progesterone Receptor Test Details:   Hormone receptor testing   is performed by immunoperoxidase method according to the ASCO/CAP   guidelines.  Formalin fixed paraffin embedded material was used, the   formalin fixation time of which is  at least six hours and not more than   72 hours, unless otherwise specified.  ER clone [id number], and PR clone [id number]   on Ventana Ultra Platform using ultraview detection kit.  ([place],   [place], [place]).  The percentage of tumor nuclei were evaluated along with   the intensity of staining as outlined in the ASCO/CAP guideline.  A   tumor that is positive for estrogen progesterone receptor shows nuclear   immunostaining in greater than or equal to 1% of the tumor cells.   Appropriate positive and negative controls were run with the patient's   specimen, and when appropriate, internal controls were evaluated for   expected pattern of staining.     Her2 neu immunohistochemistry was evaluated using revised ASO/CAP   October 2013 criteria, clone [id number] Ventana.  Per Laboratory policy, all   2+ (equivocal) results on IHC will be reflexed to HER2 FISH.  In such   circumstances, treatment decision should be made only after FISH   results are available.     Immunohistochemistry assays have not been validated on decalcified   tissues.  In such circumstances, results should be interpreted with   caution, given the increased possibility of false negative reactions on   decalcified specimens.     The use of one or more reagents in the above tests was regulated as an   analyte- specific reagent (ASR).  These tests were developed and their   performance characteristics determined by the clinical laboratories of   [facility].  They have not been cleared by the [place] Food and   Drug Administration (FDA).  The FDA has determined that such clearance   or approval is not necessary.  Gilda Crease     <Sign Out Dr. Jolayne Haines, M.D.     DIAGNOSIS:     SPINAL COLUMN, L2, BIOPSY - BONE WITH METASTATIC CARCINOMA     Comment: Immunohistochemical stains are performed on the specimen and   the results are as follows:   CK7-positive   CK20-negative   GATA3-positive   ER-positive   TTF-1-negative     The above immunohistochemical profile  would be supportive of a breast   primary.  Studies for hormone receptors, HER2, and mismatch repair are   pending and will follow in an addendum.  [facility]         <Sign Out Dr.   Jolayne Haines,   M.D.     PET scan 09/30/2020    Impression:   Intensely hypermetabolic sclerotic bone lesions concerning for   metastatic disease.       Mild to moderate increased uptake associated with right apical soft   tissue density, etiology uncertain. Chronic inflammation suspected.                                                    PET           Report   Moderate right hilar hypermetabolism likely related. Please correlate   clinically.       Intense uptake associated with the gums,  probably inflammatory.   Please correlate with exam.           Report Dictated on Workstation: [id number]       ---** Final ---**        Dictated: 09/30/2020 12:17 pm    Dictating Physician: Earlie Lou MD, Jenny Reichmann    Signed Date and Time: 09/30/2020 12:37 pm    Signed by: Earlie Lou, MD, [facility]    Transcribed Date and Time: 09/30/2020 12:17       Assessment/Plan:  Kiearra was seen today for follow-up.    Diagnoses and all orders for this visit:    Malignant neoplasm of central portion of left breast in female, estrogen receptor positive ([place])  1.  Metastatic breast cancer ER positive HER2 negative  DC tamoxifen  I would like to switch to letrozole and Ibrance.  She is having some problems regarding transportation and some other family trouble which has overwhelmed her significantly.  I will start letrozole 2.5 mg p.o. daily today  She has been connected with a Education officer, museum who has given her material to assist with financial hardship and transportation.  Hopefully that can be addressed in the next few weeks.    2.  Follow-up in 1 month.  I will plan on starting Ibrance at that visit.  Check CBC and CMP at that visit.     3.  Discussed regarding biopsy result and the advanced nature of this disease.  However, emphasized to her that even  though this is metastatic it is very treatable at this point.  In fact, she is asymptomatic currently.    4.  Patient verbalizes understanding and agrees with the plan    On this date, 12/15/2020 I have spent 45 minutes reviewing previous notes, test results and face to face with the patient discussing the diagnosis and importance of compliance with the treatment plan as well as documenting on the day of the visit.

## 2020-12-23 NOTE — Telephone Encounter (Signed)
RN navigator received a call from patient requesting a follow-up appointment for pulmonary reasons per her primary care physician.  She states that she would prefer to follow-up with Dr. Dorris Singh as she has seen him prior and was very appreciative of the care he provided.  RN navigator arranged appointment on 02/02/2021 at 11:15 AM in the [place] office at [place].  Patient is aware of date time and location and she does not have further questions at this time.  She is not having any acute issues and is okay waiting to be seen.  She will call if anything changes, in addition I will mail out appointment letter so that she has all details for appointment.

## 2021-01-17 ENCOUNTER — Ambulatory Visit
Admit: 2021-01-17 | Discharge: 2021-01-17 | Payer: MEDICARE | Attending: Hematology & Oncology | Primary: Family Medicine

## 2021-01-17 ENCOUNTER — Encounter

## 2021-01-17 DIAGNOSIS — C50112 Malignant neoplasm of central portion of left female breast: Secondary | ICD-10-CM

## 2021-01-17 LAB — CBC WITH AUTO DIFFERENTIAL
Hematocrit: 36.8 % (ref 36–46)
Hemoglobin: 12.1 g/dL (ref 12.0–16.0)
MCV: 99.6 fL
Neutrophils Absolute: 8.6 /L
Platelets: 270 K/L
RBC: 3.69 10^6/L
WBC: 12.9 10^3/mL

## 2021-01-17 LAB — COMPREHENSIVE METABOLIC PANEL
ALT: 17 U/L (ref 0–34)
AST: 30 U/L (ref 15–46)
Albumin,Serum: 3.8 g/dL (ref 3.5–5.0)
Alkaline Phosphatase: 68 U/L (ref 38–126)
Anion Gap: 7 mmol/L (ref 3–13)
BUN: 14 mg/dL (ref 9–20)
CO2: 23 mmol/L (ref 22–30)
Calcium: 9 mg/dL (ref 8.4–10.4)
Chloride: 111 mmol/L — ABNORMAL HIGH (ref 98–107)
Creatinine: 0.88 mg/dL (ref 0.52–1.25)
EGFR IF NonAfrican American: 65.5 mL/min (ref >60)
Glucose: 77 mg/dL (ref 70–100)
Potassium: 3.9 mmol/L (ref 3.5–5.1)
Sodium: 140 mmol/L (ref 135–145)
Total Bilirubin: 0.3 mg/dL (ref 0.2–1.3)
Total Protein: 6.5 g/dL (ref 6.3–8.2)
eGFR African American: 75.9 mL/min (ref >60)

## 2021-01-17 MED ORDER — PALBOCICLIB 125 MG PO TABS
125 MG | ORAL_TABLET | ORAL | 3 refills | Status: AC
Start: 2021-01-17 — End: ?

## 2021-01-17 NOTE — Progress Notes (Signed)
Chemotherapy teaching completed with patient for Ibrance days 1-21 every 28 days with daily Letrozole. [place] binder given to patient and contents reviewed including office contact information, symptom management tip sheets/book, various nutritional resources, and supportive care services. Written information on Ibrance given to patient and reviewed with her. Registered patient for [facility] program and discussed with patient. Side effects including, but not limited to: nausea, vomiting, fatigue, bone marrow suppression, risk for infection, mouth sores, renal toxicity, hair loss/thinning, diarrhea, taste changes, osteonecrosis of jaw, and etc discussed with patient and she voiced understanding.  Reviewed role of multidisciplinary team: social work, Automotive engineer, Museum/gallery curator. Discussed importance of adherence to oral chemotherapy. Reviewed barriers that could effect oral adherence, including financial, psychosocial and physical and have referred to appropriate supportive care service, if indicated. Will integrate specialty pharmacy services to also assist with oral chemotherapy management. Discussed prior authorizations of all medications prior to treatment, and discussed use of specialty pharmacy for the [place]. Patient agreeable to proceed with treatment and signed consent form. Copy of consent form given to patient. Patient voiced understanding of the above. Spoke with Jinny Blossom, [facility] with [place] and she will meet with patient. Patient has phenergan Rx at home and she states it works well for her if she has nausea. She does not need a refill at this time. Advised patient that we will contact her to prepare calendar once we have delivery date for the Ibrance.

## 2021-01-17 NOTE — Progress Notes (Signed)
HPI  Dawn Padilla is a 70 y.o.  female who comes in for a follow-up.  She is tolerating letrozole reasonably well.  Denies arthralgias or myalgias.   she feels well.  Apettite  and energy levels are good.  No hot flashes.      she has the following oncology history  1.  Left breast cancer, T1 N1 M0, stage IIa, ER/PR positive, HER-2 negative, status post mastectomy July 2015.  2.  Currently on tamoxifen since 11/16/2013.  3.  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  Revealed on biopsy of spinal lesion.  ER/PR positive HER2 negative.  Started on letrozole 2.5 mg p.o. daily September 2022.      Review of Systems   Constitutional:  Negative for fatigue, fever and unexpected weight change.   HENT:  Negative for mouth sores and trouble swallowing.    Eyes:  Negative for photophobia and visual disturbance.   Respiratory:  Negative for cough and shortness of breath.    Cardiovascular:  Negative for chest pain.   Gastrointestinal:  Negative for abdominal pain, diarrhea, nausea and vomiting.   Genitourinary:  Negative for dysuria and hematuria.   Musculoskeletal:  Positive for gait problem. Negative for arthralgias and myalgias.   Skin:  Negative for rash.   Neurological:  Negative for seizures, light-headedness and headaches.   Hematological:  Negative for adenopathy. Does not bruise/bleed easily.   Psychiatric/Behavioral:  Negative for sleep disturbance.        Family History   Problem Relation Age of Onset    Cancer Mother         lung    Cancer Father         stomach    Cancer Sister         breast    Cancer Maternal Grandmother         colon     Social History     Tobacco Use    Smoking status: Never    Smokeless tobacco: Never   Substance Use Topics    Alcohol use: No       Vitals:    01/17/21 1005   BP: (!) 156/81   Pulse: 78   Temp: 97 F (36.1 C)   SpO2: 98%       Lab Results   Component Value Date    WBC 10.7 12/01/2020    HGB 14.1 12/01/2020     HCT 42.8 12/01/2020    MCV 97.2 12/01/2020    PLT 278 12/01/2020       Assessment/Plan:  Dawn Padilla was seen today for follow-up.    Diagnoses and all orders for this visit:    Malignant neoplasm of central portion of left breast in female, estrogen receptor positive ([place])  1.  Metastatic breast cancer ER positive HER2 negative  Continue letrozole 2.5 mg p.o. daily  Start Ibrance 125 mg p.o. daily 3 weeks on 1 week off.  Follow-up CBC and CMP from today.  I have discussed some of the side efffects that include nausea, vomiting, alopecia, infection, myelosuppression,  She also has forms to assist with transportation and financial needs.  We will connect her with our financial advocate.    2.  Follow-up in 1 month.    Check CBC and CMP at that visit.     3.   Patient verbalizes understanding and agrees with the plan

## 2021-01-17 NOTE — Progress Notes (Signed)
Cbc here. [email or web address]

## 2021-01-18 NOTE — Telephone Encounter (Signed)
Per Jinny Blossom, [facility] patient to have the Ibrance delivered by 01/20/21. Will call patient to prepare calendar.     Per Dr. Lyda Kalata will initiate PA for [facility] every 12 weeks. He will discuss with the patient at her next OV. Do not schedule at this time.

## 2021-01-18 NOTE — Telephone Encounter (Signed)
Spoke with patient to review planned start date for Ibrance on 01/23/21. Reviewed with patient that we will prepare a calendar and mail it to her. OV scheduled with Dr. Lyda Kalata on 02/15/21 at 9 am and labs to be done same day. Encouraged patient to call the office with any questions or concerns, and she voiced understanding.

## 2021-01-18 NOTE — Telephone Encounter (Signed)
Specialty Pharmacy Note     Dawn Padilla  is a 70 y.o. female who was referred to [facility] for clinical management services for palbociclib    Dx:  Malignant neoplasm of central portion of left female breast (C50.112). palbociclib is appropriate for this diagnosis.    Data:  General Education for Oral Drugs     Reviewed with patient:     -Inform other doctors, dentist, health care providers that you are taking pills for cancer.   -Keep chemo pills away from children/pets, in a separate pill box from other medications or in original bottle that they came in.   [facility] hands before and after handling.   -Have a system to make sure you take your pills correctly.   -Do not crush, chew, or cut pills unless instructed otherwise by HCP.   -What to do if you miss a dose.   -Reviewed fetal risk and contraception recommendations if needed.   -Let us know if you have any problems getting your pills.     Drug Specific Information     Patient will be taking palbociclib (Ibrance) 125 mg tablets  by mouth once daily on days 1-21 of 28 day cycle.   Patient understands that the medication should be taken around the same time each day   Side effects reviewed: changes in liver function, decrease in platelets and hemoglobin, fatigue, nausea, vomiting, hair loss, and mouth irritation/sores.      Reviewed salt/ baking soda rinses and avoiding alcohol based mouthwashes to prevent mucositis.  Counseled on increased risk of bleeding - recommended soft bristle toothbrush, avoid injury, and report persistent nosebleeds and bruises to health care provider.  Reviewed that blood counts may drop while on this medication.  Counseled on how to avoid infection (hand washing, avoid sick contacts) and to report a fever of 100.5 F or higher.      Labs to be monitored: CBC and CMP    Supportive care medications reviewed: loperamide, biotene, udderly smooth,   Patient instructed on how to take and what to expect.     Does patient  have trouble swallowing medications? no   Are you able to open pill containers? yes     Assessment/Plan     1. Patient therapy reviewed for appropriateness. Patient's most recent labs reviewed. Home medication list confirmed and reviewed for drug interactions. Drug-food interactions discussed with patient if applicable. Medication list updated in Epic    Drug interactions with oral chemo therapy identified: no     2. Patient educated on oral chemotherapy as described above.      Patient's medication will be filled at: [facility]   Barriers to communication/level of understanding identified: none     3. Follow up plan: Met with Dawn Padilla in clinic yesterday and reviewed new therapy palbociclib. Patient will be receiving medication from specialty pharmacy on 01/20/21. Supportive meds biotene and udderly smooth sent with order. Will plan to follow up with patient 1-2 weeks after starting     All questions answered. Patient and/or family verbalized understanding.     Dennie Bible PharmD, [place]  [phone]

## 2021-02-02 ENCOUNTER — Encounter: Payer: MEDICARE | Attending: Internal Medicine | Primary: Family Medicine

## 2021-05-29 ENCOUNTER — Encounter: Attending: Hematology & Oncology | Primary: Family Medicine

## 2021-11-30 ENCOUNTER — Encounter: Payer: MEDICARE | Attending: Women's Health | Primary: Family Medicine

## 2022-09-01 DEATH — deceased
